# Patient Record
Sex: Male | Born: 1965 | Race: Black or African American | Hispanic: No | Marital: Single | Smoking: Never smoker
Health system: Southern US, Community
[De-identification: ages and names within clinical notes are randomized; demographics above are authoritative.]

## PROBLEM LIST (undated history)

## (undated) DIAGNOSIS — H547 Unspecified visual loss: Secondary | ICD-10-CM

## (undated) DIAGNOSIS — I639 Cerebral infarction, unspecified: Secondary | ICD-10-CM

## (undated) DIAGNOSIS — I10 Essential (primary) hypertension: Secondary | ICD-10-CM

## (undated) DIAGNOSIS — Z933 Colostomy status: Secondary | ICD-10-CM

## (undated) DIAGNOSIS — E785 Hyperlipidemia, unspecified: Secondary | ICD-10-CM

## (undated) DIAGNOSIS — D649 Anemia, unspecified: Secondary | ICD-10-CM

## (undated) DIAGNOSIS — C801 Malignant (primary) neoplasm, unspecified: Secondary | ICD-10-CM

## (undated) DIAGNOSIS — W3400XA Accidental discharge from unspecified firearms or gun, initial encounter: Secondary | ICD-10-CM

## (undated) HISTORY — DX: Hyperlipidemia, unspecified: E78.5

## (undated) HISTORY — DX: Essential (primary) hypertension: I10

## (undated) HISTORY — DX: Accidental discharge from unspecified firearms or gun, initial encounter: W34.00XA

## (undated) HISTORY — DX: Unspecified visual loss: H54.7

## (undated) HISTORY — PX: NECK SURGERY: SHX720

---

## 1995-02-08 DIAGNOSIS — W3400XA Accidental discharge from unspecified firearms or gun, initial encounter: Secondary | ICD-10-CM

## 1995-02-08 HISTORY — DX: Accidental discharge from unspecified firearms or gun, initial encounter: W34.00XA

## 1995-02-08 HISTORY — PX: LUNG REMOVAL, PARTIAL: SHX233

## 2013-02-16 ENCOUNTER — Observation Stay: Payer: Self-pay | Admitting: Surgery

## 2013-02-16 LAB — CBC
HCT: 47.2 % (ref 40.0–52.0)
HGB: 16.2 g/dL (ref 13.0–18.0)
MCH: 29.6 pg (ref 26.0–34.0)
MCHC: 34.4 g/dL (ref 32.0–36.0)
MCV: 86 fL (ref 80–100)
Platelet: 216 10*3/uL (ref 150–440)
RBC: 5.48 10*6/uL (ref 4.40–5.90)
RDW: 13.7 % (ref 11.5–14.5)
WBC: 9.8 10*3/uL (ref 3.8–10.6)

## 2013-02-16 LAB — URINALYSIS, COMPLETE
Bacteria: NONE SEEN
Bilirubin,UR: NEGATIVE
Glucose,UR: 50 mg/dL (ref 0–75)
Leukocyte Esterase: NEGATIVE
Nitrite: NEGATIVE
Ph: 5 (ref 4.5–8.0)
Protein: NEGATIVE
RBC,UR: 22 /HPF (ref 0–5)
SPECIFIC GRAVITY: 1.019 (ref 1.003–1.030)
SQUAMOUS EPITHELIAL: NONE SEEN
WBC UR: 2 /HPF (ref 0–5)

## 2013-02-16 LAB — COMPREHENSIVE METABOLIC PANEL
ALT: 29 U/L (ref 12–78)
ANION GAP: 6 — AB (ref 7–16)
Albumin: 4 g/dL (ref 3.4–5.0)
Alkaline Phosphatase: 67 U/L
BUN: 16 mg/dL (ref 7–18)
Bilirubin,Total: 0.3 mg/dL (ref 0.2–1.0)
CREATININE: 1.29 mg/dL (ref 0.60–1.30)
Calcium, Total: 9 mg/dL (ref 8.5–10.1)
Chloride: 104 mmol/L (ref 98–107)
Co2: 25 mmol/L (ref 21–32)
EGFR (Non-African Amer.): >60
Glucose: 147 mg/dL — ABNORMAL HIGH (ref 65–99)
Osmolality: 274 (ref 275–301)
POTASSIUM: 3.1 mmol/L — AB (ref 3.5–5.1)
SGOT(AST): 33 U/L (ref 15–37)
Sodium: 135 mmol/L — ABNORMAL LOW (ref 136–145)
Total Protein: 8.3 g/dL — ABNORMAL HIGH (ref 6.4–8.2)

## 2013-02-16 LAB — LIPASE, BLOOD: LIPASE: 56 U/L — AB (ref 73–393)

## 2013-02-17 LAB — CBC WITH DIFFERENTIAL/PLATELET
Basophil #: 0 10*3/uL (ref 0.0–0.1)
Basophil %: 0.3 %
Eosinophil #: 0 10*3/uL (ref 0.0–0.7)
Eosinophil %: 0.2 %
HCT: 43.5 % (ref 40.0–52.0)
HGB: 14.8 g/dL (ref 13.0–18.0)
Lymphocyte #: 1.2 10*3/uL (ref 1.0–3.6)
Lymphocyte %: 10.4 %
MCH: 29.4 pg (ref 26.0–34.0)
MCHC: 34.1 g/dL (ref 32.0–36.0)
MCV: 86 fL (ref 80–100)
MONOS PCT: 10.5 %
Monocyte #: 1.2 x10 3/mm — ABNORMAL HIGH (ref 0.2–1.0)
Neutrophil #: 8.9 10*3/uL — ABNORMAL HIGH (ref 1.4–6.5)
Neutrophil %: 78.6 %
Platelet: 181 10*3/uL (ref 150–440)
RBC: 5.05 10*6/uL (ref 4.40–5.90)
RDW: 14 % (ref 11.5–14.5)
WBC: 11.3 10*3/uL — AB (ref 3.8–10.6)

## 2013-02-17 LAB — URINE CULTURE
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
## 2015-06-03 ENCOUNTER — Other Ambulatory Visit: Payer: Self-pay | Admitting: Gastroenterology

## 2015-06-03 DIAGNOSIS — K6289 Other specified diseases of anus and rectum: Secondary | ICD-10-CM

## 2015-06-03 HISTORY — PX: UPPER GI ENDOSCOPY: SHX6162

## 2015-06-03 HISTORY — PX: COLONOSCOPY: SHX174

## 2015-06-04 ENCOUNTER — Encounter: Payer: Self-pay | Admitting: *Deleted

## 2015-06-10 ENCOUNTER — Ambulatory Visit: Admission: RE | Admit: 2015-06-10 | Payer: Medicaid Other | Source: Ambulatory Visit

## 2015-06-17 ENCOUNTER — Ambulatory Visit
Admission: RE | Admit: 2015-06-17 | Discharge: 2015-06-17 | Disposition: A | Discharge status: Home / Self Care | Payer: Medicaid Other | Source: Ambulatory Visit | Attending: Gastroenterology | Admitting: Gastroenterology

## 2015-06-17 ENCOUNTER — Encounter: Payer: Self-pay | Admitting: General Surgery

## 2015-06-17 ENCOUNTER — Ambulatory Visit (INDEPENDENT_AMBULATORY_CARE_PROVIDER_SITE_OTHER): Payer: Medicaid Other | Admitting: General Surgery

## 2015-06-17 VITALS — BP 142/76 | HR 78 | Resp 14 | Ht >= 80 in | Wt 237.0 lb

## 2015-06-17 DIAGNOSIS — C20 Malignant neoplasm of rectum: Secondary | ICD-10-CM | POA: Diagnosis not present

## 2015-06-17 DIAGNOSIS — Z9889 Other specified postprocedural states: Secondary | ICD-10-CM | POA: Diagnosis not present

## 2015-06-17 DIAGNOSIS — Z902 Acquired absence of lung [part of]: Secondary | ICD-10-CM | POA: Insufficient documentation

## 2015-06-17 DIAGNOSIS — I700 Atherosclerosis of aorta: Secondary | ICD-10-CM | POA: Insufficient documentation

## 2015-06-17 DIAGNOSIS — K6289 Other specified diseases of anus and rectum: Secondary | ICD-10-CM

## 2015-06-17 DIAGNOSIS — R938 Abnormal findings on diagnostic imaging of other specified body structures: Secondary | ICD-10-CM | POA: Insufficient documentation

## 2015-06-17 DIAGNOSIS — K629 Disease of anus and rectum, unspecified: Secondary | ICD-10-CM | POA: Insufficient documentation

## 2015-06-17 HISTORY — DX: Malignant (primary) neoplasm, unspecified: C80.1

## 2015-06-17 MED ORDER — IOPAMIDOL (ISOVUE-300) INJECTION 61%
125.0000 mL | Freq: Once | INTRAVENOUS | Status: AC | PRN
  Administered 2015-06-17: 125 mL via INTRAVENOUS

## 2015-06-17 NOTE — Patient Instructions (Signed)
This patient was sent to [facility] today to have the following drawn: CEA and Met C.   An appointment will be arranged for the patient to meet with the medical oncologist. Patient will be contacted with a date and time.   Patient's port placement has been scheduled for 06-26-15 at [facility].
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
## 2015-06-18 ENCOUNTER — Telehealth: Payer: Self-pay | Admitting: *Deleted

## 2015-06-18 DIAGNOSIS — C20 Malignant neoplasm of rectum: Secondary | ICD-10-CM | POA: Insufficient documentation

## 2015-06-18 LAB — COMPREHENSIVE METABOLIC PANEL
ALT: 18 IU/L (ref 0–44)
AST: 14 IU/L (ref 0–40)
Albumin/Globulin Ratio: 1.4 (ref 1.2–2.2)
Albumin: 4.2 g/dL (ref 3.5–5.5)
Alkaline Phosphatase: 71 IU/L (ref 39–117)
BUN/Creatinine Ratio: 9 (ref 9–20)
BUN: 10 mg/dL (ref 6–24)
Bilirubin Total: <0.2 mg/dL (ref 0.0–1.2)
CALCIUM: 9.5 mg/dL (ref 8.7–10.2)
CO2: 22 mmol/L (ref 18–29)
CREATININE: 1.11 mg/dL (ref 0.76–1.27)
Chloride: 98 mmol/L (ref 96–106)
GFR, EST AFRICAN AMERICAN: 90 mL/min/{1.73_m2} (ref >59)
GFR, EST NON AFRICAN AMERICAN: 78 mL/min/{1.73_m2} (ref >59)
GLUCOSE: 116 mg/dL — AB (ref 65–99)
Globulin, Total: 3.1 g/dL (ref 1.5–4.5)
POTASSIUM: 4 mmol/L (ref 3.5–5.2)
Sodium: 139 mmol/L (ref 134–144)
TOTAL PROTEIN: 7.3 g/dL (ref 6.0–8.5)

## 2015-06-18 LAB — CEA: CEA: 0.9 ng/mL (ref 0.0–4.7)
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
## 2015-06-18 NOTE — Telephone Encounter (Signed)
Patient has been scheduled for an appointment with Dr. Oliva Bustard at the [facility] for 06-25-15 at 11 am.   This patient is aware of date, time, and instructions.

## 2015-06-19 ENCOUNTER — Encounter: Payer: Self-pay | Admitting: *Deleted

## 2015-06-19 ENCOUNTER — Other Ambulatory Visit: Payer: Medicaid Other

## 2015-06-19 NOTE — Patient Instructions (Signed)
  Your procedure is scheduled on: 06-26-15 (FRIDAY) Report to [place] To find out your arrival time please call [phone] between 1PM - 3PM on 06-25-15 (THURSDAY)  Remember: Instructions that are not followed completely may result in serious medical risk, up to and including death, or upon the discretion of your surgeon and anesthesiologist your surgery may need to be rescheduled.    _X___ 1. Do not eat food or drink liquids after midnight. No gum chewing or hard candies.     _X___ 2. No Alcohol for 24 hours before or after surgery.   ____ 3. Bring all medications with you on the day of surgery if instructed.    _X___ 4. Notify your doctor if there is any change in your medical condition     (cold, fever, infections).     Do not wear jewelry, make-up, hairpins, clips or nail polish.  Do not wear lotions, powders, or perfumes. You may wear deodorant.  Do not shave 48 hours prior to surgery. Men may shave face and neck.  Do not bring valuables to the hospital.    [facility] is not responsible for any belongings or valuables.               Contacts, dentures or bridgework may not be worn into surgery.  Leave your suitcase in the car. After surgery it may be brought to your room.  For patients admitted to the hospital, discharge time is determined by your treatment team.   Patients discharged the day of surgery will not be allowed to drive home.   Please read over the following fact sheets that you were given:      _X___ Take these medicines the morning of surgery with A SIP OF WATER:    1. AMLODIPINE (NORVASC)  2.   3.   4.  5.  6.  ____ Fleet Enema (as directed)   _X___ Use CHG Soap as directed  ____ Use inhalers on the day of surgery  ____ Stop metformin 2 days prior to surgery    ____ Take 1/2 of usual insulin dose the night before surgery and none on the morning of surgery.   ____ Stop Coumadin/Plavix/aspirin  ____ Stop  Anti-inflammatories   ____ Stop supplements until after surgery.    ____ Bring C-Pap to the hospital.

## 2015-06-22 ENCOUNTER — Telehealth: Payer: Self-pay

## 2015-06-22 NOTE — Telephone Encounter (Signed)
  Oncology Nurse Navigator Documentation  Navigator Location: [facility] (06/22/15 1000) Navigator Encounter Type: Introductory phone call;Telephone (06/22/15 1000) Telephone: Outgoing Call (06/22/15 1000)             Barriers/Navigation Needs: Coordination of Care (06/22/15 1000)   Interventions: Coordination of Care (06/22/15 1000)   Coordination of Care: EUS (06/22/15 1000)        Acuity: Level 2 (06/22/15 1000)   Acuity Level 2: Initial guidance, education and coordination as needed;Educational needs;Assistance expediting appointments;Ongoing guidance and education throughout treatment as needed (06/22/15 1000)     Time Spent with Patient: 30 (06/22/15 1000)   Was able to reach [name] [name] on the telephone. Educated him regarding need for EUS, preferably this week. Educated on EUS and prep. Will need to refer this out to [facility] due to inability at [facility]. Patient information has been sent for referral.

## 2015-06-24 ENCOUNTER — Encounter
Admission: RE | Admit: 2015-06-24 | Discharge: 2015-06-24 | Disposition: A | Discharge status: Home / Self Care | Payer: Medicaid Other | Source: Ambulatory Visit | Attending: General Surgery | Admitting: General Surgery

## 2015-06-24 ENCOUNTER — Other Ambulatory Visit: Payer: Self-pay

## 2015-06-24 DIAGNOSIS — I10 Essential (primary) hypertension: Secondary | ICD-10-CM | POA: Insufficient documentation

## 2015-06-24 DIAGNOSIS — Z01810 Encounter for preprocedural cardiovascular examination: Secondary | ICD-10-CM | POA: Insufficient documentation

## 2015-06-25 ENCOUNTER — Encounter: Payer: Self-pay | Admitting: Oncology

## 2015-06-25 ENCOUNTER — Inpatient Hospital Stay: Payer: Medicaid Other | Admitting: Oncology

## 2015-06-25 ENCOUNTER — Inpatient Hospital Stay: Payer: Medicaid Other | Attending: Oncology | Admitting: Oncology

## 2015-06-25 ENCOUNTER — Inpatient Hospital Stay: Payer: Medicaid Other

## 2015-06-25 VITALS — BP 122/79 | HR 102 | Temp 97.9°F | Resp 18 | Wt 236.2 lb

## 2015-06-25 DIAGNOSIS — Z79899 Other long term (current) drug therapy: Secondary | ICD-10-CM | POA: Insufficient documentation

## 2015-06-25 DIAGNOSIS — E785 Hyperlipidemia, unspecified: Secondary | ICD-10-CM | POA: Insufficient documentation

## 2015-06-25 DIAGNOSIS — Z800 Family history of malignant neoplasm of digestive organs: Secondary | ICD-10-CM | POA: Insufficient documentation

## 2015-06-25 DIAGNOSIS — C20 Malignant neoplasm of rectum: Secondary | ICD-10-CM | POA: Insufficient documentation

## 2015-06-25 DIAGNOSIS — Z862 Personal history of diseases of the blood and blood-forming organs and certain disorders involving the immune mechanism: Secondary | ICD-10-CM | POA: Diagnosis not present

## 2015-06-25 DIAGNOSIS — I10 Essential (primary) hypertension: Secondary | ICD-10-CM | POA: Diagnosis not present

## 2015-06-25 DIAGNOSIS — R918 Other nonspecific abnormal finding of lung field: Secondary | ICD-10-CM | POA: Diagnosis not present

## 2015-06-25 DIAGNOSIS — K769 Liver disease, unspecified: Secondary | ICD-10-CM | POA: Insufficient documentation

## 2015-06-25 DIAGNOSIS — H540 Blindness, both eyes: Secondary | ICD-10-CM | POA: Insufficient documentation

## 2015-06-25 LAB — CBC WITH DIFFERENTIAL/PLATELET
BASOS ABS: 0.1 10*3/uL (ref 0–0.1)
Eosinophils Absolute: 0.4 10*3/uL (ref 0–0.7)
Eosinophils Relative: 4 %
HEMATOCRIT: 38 % — AB (ref 40.0–52.0)
Hemoglobin: 12.4 g/dL — ABNORMAL LOW (ref 13.0–18.0)
Lymphocytes Relative: 21 %
Lymphs Abs: 1.9 10*3/uL (ref 1.0–3.6)
MCH: 26.7 pg (ref 26.0–34.0)
MCHC: 32.7 g/dL (ref 32.0–36.0)
MCV: 81.7 fL (ref 80.0–100.0)
MONO ABS: 0.8 10*3/uL (ref 0.2–1.0)
Monocytes Relative: 9 %
NEUTROS ABS: 5.8 10*3/uL (ref 1.4–6.5)
Platelets: 370 10*3/uL (ref 150–440)
RBC: 4.65 MIL/uL (ref 4.40–5.90)
RDW: 17.2 % — AB (ref 11.5–14.5)
WBC: 9 10*3/uL (ref 3.8–10.6)
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
## 2015-06-25 NOTE — Progress Notes (Signed)
  Oncology Nurse Navigator Documentation  Navigator Location: [facility] (06/25/15 1600) Navigator Encounter Type: Initial MedOnc (06/25/15 1600)   Abnormal Finding Date: 06/03/15 (06/25/15 1600)       Patient Visit Type: MedOnc;Initial (06/25/15 1600) Treatment Phase: Pre-Tx/Tx Discussion (06/25/15 1600) Barriers/Navigation Needs: Coordination of Care (06/25/15 1600)   Interventions: Coordination of Care (06/25/15 1600)                      Time Spent with Patient: 30 (06/25/15 1600)   Met with [name] [name] and his mother during consult with Dr Oliva Bustard for rectal caner. Post EUS for staging today at [facility]. Provided him with my contat information for any future needs or questions. For port placement 5/19 with Dr Bary Castilla. Radiation Oncology will consult next week. 30f to start following.

## 2015-06-25 NOTE — Progress Notes (Signed)
Patient had rectal [place] today.  Scheduled for colon surgery tomorrow.

## 2015-06-26 ENCOUNTER — Ambulatory Visit: Payer: Medicaid Other

## 2015-06-26 ENCOUNTER — Ambulatory Visit: Payer: Medicaid Other | Admitting: Registered Nurse

## 2015-06-26 ENCOUNTER — Encounter
Admission: RE | Disposition: A | Discharge status: Home / Self Care | Payer: Self-pay | Source: Ambulatory Visit | Attending: General Surgery

## 2015-06-26 ENCOUNTER — Encounter: Payer: Self-pay | Admitting: *Deleted

## 2015-06-26 ENCOUNTER — Ambulatory Visit
Admission: RE | Admit: 2015-06-26 | Discharge: 2015-06-26 | Disposition: A | Discharge status: Home / Self Care | Payer: Medicaid Other | Source: Ambulatory Visit | Attending: General Surgery | Admitting: General Surgery

## 2015-06-26 DIAGNOSIS — I10 Essential (primary) hypertension: Secondary | ICD-10-CM | POA: Insufficient documentation

## 2015-06-26 DIAGNOSIS — E119 Type 2 diabetes mellitus without complications: Secondary | ICD-10-CM | POA: Insufficient documentation

## 2015-06-26 DIAGNOSIS — C20 Malignant neoplasm of rectum: Secondary | ICD-10-CM | POA: Insufficient documentation

## 2015-06-26 DIAGNOSIS — Z95828 Presence of other vascular implants and grafts: Secondary | ICD-10-CM

## 2015-06-26 HISTORY — DX: Anemia, unspecified: D64.9

## 2015-06-26 HISTORY — PX: PORTACATH PLACEMENT: SHX2246

## 2015-06-26 SURGERY — INSERTION, TUNNELED CENTRAL VENOUS DEVICE, WITH PORT
Anesthesia: Monitor Anesthesia Care | Wound class: Clean

## 2015-06-26 MED ORDER — LIDOCAINE HCL (PF) 1 % IJ SOLN
INTRAMUSCULAR | Status: AC
  Filled 2015-06-26: qty 30

## 2015-06-26 MED ORDER — PROPOFOL 500 MG/50ML IV EMUL
INTRAVENOUS | Status: DC | PRN
  Administered 2015-06-26: 150 ug/kg/min via INTRAVENOUS

## 2015-06-26 MED ORDER — TRAMADOL HCL 50 MG PO TABS
ORAL_TABLET | ORAL | Status: AC
  Filled 2015-06-26: qty 1

## 2015-06-26 MED ORDER — GLYCOPYRROLATE 0.2 MG/ML IJ SOLN
INTRAMUSCULAR | Status: DC | PRN
  Administered 2015-06-26: 0.2 mg via INTRAVENOUS

## 2015-06-26 MED ORDER — PROPOFOL 10 MG/ML IV BOLUS
INTRAVENOUS | Status: DC | PRN
  Administered 2015-06-26: 20 mg via INTRAVENOUS

## 2015-06-26 MED ORDER — SODIUM CHLORIDE 0.9 % IJ SOLN
INTRAMUSCULAR | Status: AC
  Filled 2015-06-26: qty 50

## 2015-06-26 MED ORDER — FENTANYL CITRATE (PF) 100 MCG/2ML IJ SOLN
INTRAMUSCULAR | Status: DC | PRN
  Administered 2015-06-26: 50 ug via INTRAVENOUS

## 2015-06-26 MED ORDER — FENTANYL CITRATE (PF) 100 MCG/2ML IJ SOLN
INTRAMUSCULAR | Status: AC
  Administered 2015-06-26: 25 ug via INTRAVENOUS
  Filled 2015-06-26: qty 2

## 2015-06-26 MED ORDER — FENTANYL CITRATE (PF) 100 MCG/2ML IJ SOLN
25.0000 ug | INTRAMUSCULAR | Status: DC | PRN
  Administered 2015-06-26 (×3): 25 ug via INTRAVENOUS

## 2015-06-26 MED ORDER — TRAMADOL HCL 50 MG PO TABS
50.0000 mg | ORAL_TABLET | Freq: Four times a day (QID) | ORAL | Status: AC | PRN
  Administered 2015-06-26: 50 mg via ORAL

## 2015-06-26 MED ORDER — TRAMADOL HCL 50 MG PO TABS: 100.0000 mg | ORAL_TABLET | Freq: Four times a day (QID) | ORAL | Status: DC | PRN

## 2015-06-26 MED ORDER — ONDANSETRON HCL 4 MG/2ML IJ SOLN: 4.0000 mg | Freq: Once | INTRAMUSCULAR | Status: DC | PRN

## 2015-06-26 MED ORDER — FAMOTIDINE 20 MG PO TABS
20.0000 mg | ORAL_TABLET | Freq: Once | ORAL | Status: AC
  Administered 2015-06-26: 20 mg via ORAL

## 2015-06-26 MED ORDER — LACTATED RINGERS IV SOLN
INTRAVENOUS | Status: DC
  Administered 2015-06-26: 50 mL/h via INTRAVENOUS

## 2015-06-26 MED ORDER — CEFAZOLIN SODIUM-DEXTROSE 2-4 GM/100ML-% IV SOLN
2.0000 g | INTRAVENOUS | Status: AC
  Administered 2015-06-26: 2 g via INTRAVENOUS

## 2015-06-26 MED ORDER — MIDAZOLAM HCL 2 MG/2ML IJ SOLN
INTRAMUSCULAR | Status: DC | PRN
  Administered 2015-06-26: 2 mg via INTRAVENOUS

## 2015-06-26 MED ORDER — HYDROCODONE-ACETAMINOPHEN 5-325 MG PO TABS: 1.0000 | ORAL_TABLET | ORAL | Status: DC | PRN

## 2015-06-26 MED ORDER — CEFAZOLIN SODIUM-DEXTROSE 2-4 GM/100ML-% IV SOLN
INTRAVENOUS | Status: AC
  Administered 2015-06-26: 2 g via INTRAVENOUS
  Filled 2015-06-26: qty 100

## 2015-06-26 MED ORDER — FAMOTIDINE 20 MG PO TABS
ORAL_TABLET | ORAL | Status: AC
  Administered 2015-06-26: 20 mg via ORAL
  Filled 2015-06-26: qty 1

## 2015-06-26 MED ORDER — SODIUM CHLORIDE 0.9 % IJ SOLN
INTRAMUSCULAR | Status: DC | PRN
  Administered 2015-06-26: 15 mL
  Administered 2015-06-26: 10 mL

## 2015-06-26 SURGICAL SUPPLY — 28 items
BLADE SURG 15 STRL SS SAFETY (BLADE) ×3 IMPLANT
CHLORAPREP W/TINT 26ML (MISCELLANEOUS) ×3 IMPLANT
CLOSURE WOUND 1/2 X4 (GAUZE/BANDAGES/DRESSINGS) ×1
COVER LIGHT HANDLE STERIS (MISCELLANEOUS) ×6 IMPLANT
DECANTER SPIKE VIAL GLASS SM (MISCELLANEOUS) ×6 IMPLANT
DRAPE C-ARM XRAY 36X54 (DRAPES) ×3 IMPLANT
DRAPE LAPAROTOMY TRNSV 106X77 (MISCELLANEOUS) ×3 IMPLANT
DRESSING TELFA 4X3 1S ST N-ADH (GAUZE/BANDAGES/DRESSINGS) ×3 IMPLANT
DRSG TEGADERM 2-3/8X2-3/4 SM (GAUZE/BANDAGES/DRESSINGS) ×3 IMPLANT
DRSG TEGADERM 4X4.75 (GAUZE/BANDAGES/DRESSINGS) ×3 IMPLANT
ELECT REM PT RETURN 9FT ADLT (ELECTROSURGICAL) ×3
ELECTRODE REM PT RTRN 9FT ADLT (ELECTROSURGICAL) ×1 IMPLANT
GLOVE BIO SURGEON STRL SZ7.5 (GLOVE) ×9 IMPLANT
GLOVE INDICATOR 8.0 STRL GRN (GLOVE) ×6 IMPLANT
GOWN STRL REUS W/ TWL LRG LVL3 (GOWN DISPOSABLE) ×2 IMPLANT
GOWN STRL REUS W/TWL LRG LVL3 (GOWN DISPOSABLE) ×4
KIT PORT POWER 8FR ISP CVUE (Catheter) ×3 IMPLANT
KIT RM TURNOVER STRD PROC AR (KITS) ×3 IMPLANT
LABEL OR SOLS (LABEL) ×3 IMPLANT
NS IRRIG 500ML POUR BTL (IV SOLUTION) ×3 IMPLANT
PACK PORT-A-CATH (MISCELLANEOUS) ×3 IMPLANT
STRIP CLOSURE SKIN 1/2X4 (GAUZE/BANDAGES/DRESSINGS) ×2 IMPLANT
SUT PROLENE 3 0 SH DA (SUTURE) ×3 IMPLANT
SUT VIC AB 3-0 SH 27 (SUTURE) ×2
SUT VIC AB 3-0 SH 27X BRD (SUTURE) ×1 IMPLANT
SUT VIC AB 4-0 FS2 27 (SUTURE) ×3 IMPLANT
SWABSTK COMLB BENZOIN TINCTURE (MISCELLANEOUS) ×3 IMPLANT
SYRINGE 10CC LL (SYRINGE) ×3 IMPLANT

## 2015-06-26 NOTE — Progress Notes (Signed)
Dr Bary Castilla in to see patient, rx for vicodin shredded in PACU due to pt allergy, MD gave new RX to pt for Ultram

## 2015-06-26 NOTE — Anesthesia Procedure Notes (Signed)
Date/Time: 06/26/2015 9:04 AM Performed by: Doreen Salvage Pre-anesthesia Checklist: Patient identified, Emergency Drugs available, Suction available and Patient being monitored Patient Re-evaluated:Patient Re-evaluated prior to inductionOxygen Delivery Method: Simple face mask Intubation Type: IV induction Dental Injury: Teeth and Oropharynx as per pre-operative assessment

## 2015-06-26 NOTE — Op Note (Signed)
Preoperative diagnosis: T3 rectal cancer, candidate for neoadjuvant chemoradiation.  Postoperative diagnosis: Same.  Operative procedure: Left subclavian PowerPort placement with ultrasound and fluoroscopic guidance.  Operating surgeon: Ollen Bowl, M.D.  Anesthesia: Attended local, 10 mL 1% plain Xylocaine.  Estimate blood loss: Less than 5 mL.  Clinical note: This 50 year old male was recent and I would rectal cancer needs a candidate for neoadjuvant chemotherapy. Central venous access was requested by the treating oncologist.  Operative note: The patient received Kefzol prior the procedure. Chest was previously prepped with clippers prior to presentation the operating theater. After the induction of sedation the chest neck and axilla was prepped with ChloraPrep and draped. Ultrasound was used to confirm patency of the left subclavian vein. This was cannulated under ultrasound guidance. Guidewire was passed of the SVC followed by the dilator and catheter. It was necessary to place the catheter tip in the right atrium to get good blood return with the patient in the supine position. The cannula was tunneled to a pocket on the left anterior chest were was attached to the port. It was easily irrigated and aspirated in this position. The port was anchored to the deep fascia with interrupted 3-0 Prolene sutures. It was flushed with 10 mL of injectable saline at the end of the procedure. Adipose layer was approximated with a running 3-0 Vicryl suture. Skin closed with a running 4-0 Vicryl septic suture. Benzoin, Steri-Strips, Telfa and Tegaderm dressing applied.  An erect portable chest x-ray obtained in recovery room showed the catheter tip in the right atrium and no evidence of pneumothorax.

## 2015-06-26 NOTE — Discharge Instructions (Signed)

## 2015-06-26 NOTE — Transfer of Care (Signed)
Immediate Anesthesia Transfer of Care Note  Patient: [name] [name]  Procedure(s) Performed: Procedure(s): INSERTION PORT-A-CATH (N/A)  Patient Location: PACU  Anesthesia Type:General  Level of Consciousness: sedated  Airway & Oxygen Therapy: Patient Spontanous Breathing and Patient connected to face mask oxygen  Post-op Assessment: Report given to RN and Post -op Vital signs reviewed and stable  Post vital signs: Reviewed and stable  Last Vitals:  Filed Vitals:   06/26/15 0803 06/26/15 0951  BP: 118/92 109/71  Pulse: 75 65  Temp: 37.3 C 37 C  Resp: 18 16    Complications: No apparent anesthesia complications

## 2015-06-26 NOTE — H&P (Signed)
Candidate for neo-adjuvant chemotherapy. Central venous access requested. 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 Plan: Left power port placement.

## 2015-06-26 NOTE — Anesthesia Preprocedure Evaluation (Signed)
Anesthesia Evaluation  Patient identified by MRN, date of birth, ID band Patient awake    Reviewed: Allergy & Precautions, H&P , NPO status , Patient's Chart, lab work & pertinent test results, reviewed documented beta blocker date and time   Airway Mallampati: II  TM Distance: >3 FB Neck ROM: full    Dental no notable dental hx. (+) Teeth Intact   Pulmonary neg pulmonary ROS,    Pulmonary exam normal breath sounds clear to auscultation       Cardiovascular Exercise Tolerance: Good hypertension, negative cardio ROS   Rhythm:regular Rate:Normal     Neuro/Psych negative neurological ROS  negative psych ROS   GI/Hepatic negative GI ROS, Neg liver ROS,   Endo/Other  negative endocrine ROSdiabetes  Renal/GU      Musculoskeletal   Abdominal   Peds  Hematology negative hematology ROS (+) anemia ,   Anesthesia Other Findings   Reproductive/Obstetrics negative OB ROS                             Anesthesia Physical Anesthesia Plan  ASA: II  Anesthesia Plan: MAC   Post-op Pain Management:    Induction:   Airway Management Planned:   Additional Equipment:   Intra-op Plan:   Post-operative Plan:   Informed Consent: I have reviewed the patients History and Physical, chart, labs and discussed the procedure including the risks, benefits and alternatives for the proposed anesthesia with the patient or authorized representative who has indicated his/her understanding and acceptance.     Plan Discussed with: CRNA  Anesthesia Plan Comments:         Anesthesia Quick Evaluation

## 2015-06-29 NOTE — Patient Instructions (Signed)
Fluorouracil, 5-FU injection What is this medicine? FLUOROURACIL, 5-FU (flure oh YOOR a sil) is a chemotherapy drug. It slows the growth of cancer cells. This medicine is used to treat many types of cancer like breast cancer, colon or rectal cancer, pancreatic cancer, and stomach cancer. This medicine may be used for other purposes; ask your health care provider or pharmacist if you have questions. What should I tell my health care provider before I take this medicine? They need to know if you have any of these conditions: -blood disorders -dihydropyrimidine dehydrogenase (DPD) deficiency -infection (especially a virus infection such as chickenpox, cold sores, or herpes) -kidney disease -liver disease -malnourished, poor nutrition -recent or ongoing radiation therapy -an unusual or allergic reaction to fluorouracil, other chemotherapy, other medicines, foods, dyes, or preservatives -pregnant or trying to get pregnant -breast-feeding How should I use this medicine? This drug is given as an infusion or injection into a vein. It is administered in a hospital or clinic by a specially trained health care professional. Talk to your pediatrician regarding the use of this medicine in children. Special care may be needed. Overdosage: If you think you have taken too much of this medicine contact a poison control center or emergency room at once. NOTE: This medicine is only for you. Do not share this medicine with others. What if I miss a dose? It is important not to miss your dose. Call your doctor or health care professional if you are unable to keep an appointment. What may interact with this medicine? -allopurinol -cimetidine -dapsone -digoxin -hydroxyurea -leucovorin -levamisole -medicines for seizures like ethotoin, fosphenytoin, phenytoin -medicines to increase blood counts like filgrastim, pegfilgrastim, sargramostim -medicines that treat or prevent blood clots like warfarin,  enoxaparin, and dalteparin -methotrexate -metronidazole -pyrimethamine -some other chemotherapy drugs like busulfan, cisplatin, estramustine, vinblastine -trimethoprim -trimetrexate -vaccines Talk to your doctor or health care professional before taking any of these medicines: -acetaminophen -aspirin -ibuprofen -ketoprofen -naproxen This list may not describe all possible interactions. Give your health care provider a list of all the medicines, herbs, non-prescription drugs, or dietary supplements you use. Also tell them if you smoke, drink alcohol, or use illegal drugs. Some items may interact with your medicine. What should I watch for while using this medicine? Visit your doctor for checks on your progress. This drug may make you feel generally unwell. This is not uncommon, as chemotherapy can affect healthy cells as well as cancer cells. Report any side effects. Continue your course of treatment even though you feel ill unless your doctor tells you to stop. In some cases, you may be given additional medicines to help with side effects. Follow all directions for their use. Call your doctor or health care professional for advice if you get a fever, chills or sore throat, or other symptoms of a cold or flu. Do not treat yourself. This drug decreases your body's ability to fight infections. Try to avoid being around people who are sick. This medicine may increase your risk to bruise or bleed. Call your doctor or health care professional if you notice any unusual bleeding. Be careful brushing and flossing your teeth or using a toothpick because you may get an infection or bleed more easily. If you have any dental work done, tell your dentist you are receiving this medicine. Avoid taking products that contain aspirin, acetaminophen, ibuprofen, naproxen, or ketoprofen unless instructed by your doctor. These medicines may hide a fever. Do not become pregnant while taking this medicine. Women should    inform their doctor if they wish to become pregnant or think they might be pregnant. There is a potential for serious side effects to an unborn child. Talk to your health care professional or pharmacist for more information. Do not breast-feed an infant while taking this medicine. Men should inform their doctor if they wish to father a child. This medicine may lower sperm counts. Do not treat diarrhea with over the counter products. Contact your doctor if you have diarrhea that lasts more than 2 days or if it is severe and watery. This medicine can make you more sensitive to the sun. Keep out of the sun. If you cannot avoid being in the sun, wear protective clothing and use sunscreen. Do not use sun lamps or tanning beds/booths. What side effects may I notice from receiving this medicine? Side effects that you should report to your doctor or health care professional as soon as possible: -allergic reactions like skin rash, itching or hives, swelling of the face, lips, or tongue -low blood counts - this medicine may decrease the number of white blood cells, red blood cells and platelets. You may be at increased risk for infections and bleeding. -signs of infection - fever or chills, cough, sore throat, pain or difficulty passing urine -signs of decreased platelets or bleeding - bruising, pinpoint red spots on the skin, black, tarry stools, blood in the urine -signs of decreased red blood cells - unusually weak or tired, fainting spells, lightheadedness -breathing problems -changes in vision -chest pain -mouth sores -nausea and vomiting -pain, swelling, redness at site where injected -pain, tingling, numbness in the hands or feet -redness, swelling, or sores on hands or feet -stomach pain -unusual bleeding Side effects that usually do not require medical attention (report to your doctor or health care professional if they continue or are bothersome): -changes in finger or toe  nails -diarrhea -dry or itchy skin -hair loss -headache -loss of appetite -sensitivity of eyes to the light -stomach upset -unusually teary eyes This list may not describe all possible side effects. Call your doctor for medical advice about side effects. You may report side effects to FDA at 1-800-FDA-1088. Where should I keep my medicine? This drug is given in a hospital or clinic and will not be stored at home. NOTE: This sheet is a summary. It may not cover all possible information. If you have questions about this medicine, talk to your doctor, pharmacist, or health care provider.    2016, [employer]. (2007-05-30 13:53:16)  

## 2015-06-29 NOTE — Anesthesia Postprocedure Evaluation (Signed)
Anesthesia Post Note  Patient: [name] [name]  Procedure(s) Performed: Procedure(s) (LRB): INSERTION PORT-A-CATH (N/A)  Patient location during evaluation: PACU Anesthesia Type: General Level of consciousness: awake and alert Pain management: pain level controlled Vital Signs Assessment: post-procedure vital signs reviewed and stable Respiratory status: spontaneous breathing, nonlabored ventilation, respiratory function stable and patient connected to nasal cannula oxygen Cardiovascular status: blood pressure returned to baseline and stable Postop Assessment: no signs of nausea or vomiting Anesthetic complications: no    Last Vitals:  Filed Vitals:   06/26/15 1044 06/26/15 1124  BP: 147/98 151/90  Pulse: 67 60  Temp: 36.7 C   Resp: 18 18    Last Pain:  Filed Vitals:   06/29/15 0838  PainSc: 0-No pain                 Molli Barrows

## 2015-06-30 ENCOUNTER — Inpatient Hospital Stay: Payer: Medicaid Other

## 2015-07-01 ENCOUNTER — Ambulatory Visit
Admission: RE | Admit: 2015-07-01 | Discharge: 2015-07-01 | Disposition: A | Discharge status: Home / Self Care | Payer: Medicaid Other | Source: Ambulatory Visit | Attending: Radiation Oncology | Admitting: Radiation Oncology

## 2015-07-01 ENCOUNTER — Encounter: Payer: Self-pay | Admitting: Radiation Oncology

## 2015-07-01 VITALS — BP 152/103 | HR 95 | Temp 98.0°F | Ht 78.0 in | Wt 235.7 lb

## 2015-07-01 DIAGNOSIS — Z510 Encounter for antineoplastic radiation therapy: Secondary | ICD-10-CM | POA: Insufficient documentation

## 2015-07-01 DIAGNOSIS — Z87828 Personal history of other (healed) physical injury and trauma: Secondary | ICD-10-CM | POA: Insufficient documentation

## 2015-07-01 DIAGNOSIS — H540 Blindness, both eyes: Secondary | ICD-10-CM | POA: Insufficient documentation

## 2015-07-01 DIAGNOSIS — Z800 Family history of malignant neoplasm of digestive organs: Secondary | ICD-10-CM | POA: Insufficient documentation

## 2015-07-01 DIAGNOSIS — Z79899 Other long term (current) drug therapy: Secondary | ICD-10-CM | POA: Insufficient documentation

## 2015-07-01 DIAGNOSIS — C20 Malignant neoplasm of rectum: Secondary | ICD-10-CM | POA: Insufficient documentation
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
## 2015-07-01 NOTE — Progress Notes (Signed)
Written and verbal information given regarding side effects,  appointments.  Patient and Mother verbalized understanding of all information; questions answered to their satisfaction.  15 minutes spent educating patient and mother.

## 2015-07-03 ENCOUNTER — Other Ambulatory Visit: Payer: Self-pay | Admitting: *Deleted

## 2015-07-03 ENCOUNTER — Telehealth: Payer: Self-pay | Admitting: *Deleted

## 2015-07-03 ENCOUNTER — Ambulatory Visit
Admission: RE | Admit: 2015-07-03 | Discharge: 2015-07-03 | Disposition: A | Discharge status: Home / Self Care | Payer: Medicaid Other | Source: Ambulatory Visit | Attending: Radiation Oncology | Admitting: Radiation Oncology

## 2015-07-03 DIAGNOSIS — Z87828 Personal history of other (healed) physical injury and trauma: Secondary | ICD-10-CM | POA: Diagnosis not present

## 2015-07-03 DIAGNOSIS — Z800 Family history of malignant neoplasm of digestive organs: Secondary | ICD-10-CM | POA: Diagnosis not present

## 2015-07-03 DIAGNOSIS — Z79899 Other long term (current) drug therapy: Secondary | ICD-10-CM | POA: Diagnosis not present

## 2015-07-03 DIAGNOSIS — C20 Malignant neoplasm of rectum: Secondary | ICD-10-CM | POA: Diagnosis not present

## 2015-07-03 DIAGNOSIS — Z510 Encounter for antineoplastic radiation therapy: Secondary | ICD-10-CM | POA: Diagnosis present

## 2015-07-03 DIAGNOSIS — H540 Blindness, both eyes: Secondary | ICD-10-CM | POA: Diagnosis not present

## 2015-07-03 NOTE — Telephone Encounter (Signed)
Pt was questioning why he has to wear a pump to receive chemotherapy. Informed pt that Dr. Oliva Bustard recommends to start 59fu chemotherapy with a 7day pump and that he does not have to worry about operating the pump and that we will manage all the settings for the pump. Informed pt that all he has to do is wear the pump for 7 days then return to the clinic to get it removed to put another pump on. Pt verbalized understanding.   Pt questioned if needed to continue iron tablets. Instructed pt to continue taking at this time then can recheck labs at next appt to see if iron tablet can be discontinued. Pt verbalized understanding.   Informed pt to callback if has any further questions.

## 2015-07-08 DIAGNOSIS — Z510 Encounter for antineoplastic radiation therapy: Secondary | ICD-10-CM | POA: Diagnosis not present

## 2015-07-13 ENCOUNTER — Ambulatory Visit
Admission: RE | Admit: 2015-07-13 | Discharge: 2015-07-13 | Disposition: A | Discharge status: Home / Self Care | Payer: Medicaid Other | Source: Ambulatory Visit | Attending: Radiation Oncology | Admitting: Radiation Oncology

## 2015-07-13 DIAGNOSIS — Z510 Encounter for antineoplastic radiation therapy: Secondary | ICD-10-CM | POA: Diagnosis not present

## 2015-07-14 ENCOUNTER — Ambulatory Visit
Admission: RE | Admit: 2015-07-14 | Discharge: 2015-07-14 | Disposition: A | Discharge status: Home / Self Care | Payer: Medicaid Other | Source: Ambulatory Visit | Attending: Radiation Oncology | Admitting: Radiation Oncology

## 2015-07-14 DIAGNOSIS — Z510 Encounter for antineoplastic radiation therapy: Secondary | ICD-10-CM | POA: Diagnosis not present

## 2015-07-15 ENCOUNTER — Inpatient Hospital Stay: Payer: Medicaid Other | Attending: Oncology

## 2015-07-15 ENCOUNTER — Encounter: Payer: Self-pay | Admitting: Oncology

## 2015-07-15 ENCOUNTER — Telehealth: Payer: Self-pay | Admitting: *Deleted

## 2015-07-15 ENCOUNTER — Ambulatory Visit
Admission: RE | Admit: 2015-07-15 | Discharge: 2015-07-15 | Disposition: A | Discharge status: Home / Self Care | Payer: Medicaid Other | Source: Ambulatory Visit | Attending: Radiation Oncology | Admitting: Radiation Oncology

## 2015-07-15 ENCOUNTER — Inpatient Hospital Stay: Payer: Medicaid Other

## 2015-07-15 ENCOUNTER — Inpatient Hospital Stay (HOSPITAL_BASED_OUTPATIENT_CLINIC_OR_DEPARTMENT_OTHER): Payer: Medicaid Other | Admitting: Oncology

## 2015-07-15 VITALS — BP 164/111 | HR 89 | Temp 96.8°F | Resp 18 | Wt 235.0 lb

## 2015-07-15 DIAGNOSIS — R197 Diarrhea, unspecified: Secondary | ICD-10-CM

## 2015-07-15 DIAGNOSIS — Z79899 Other long term (current) drug therapy: Secondary | ICD-10-CM | POA: Insufficient documentation

## 2015-07-15 DIAGNOSIS — Z510 Encounter for antineoplastic radiation therapy: Secondary | ICD-10-CM | POA: Diagnosis not present

## 2015-07-15 DIAGNOSIS — H540 Blindness, both eyes: Secondary | ICD-10-CM | POA: Diagnosis not present

## 2015-07-15 DIAGNOSIS — E785 Hyperlipidemia, unspecified: Secondary | ICD-10-CM | POA: Diagnosis not present

## 2015-07-15 DIAGNOSIS — G4700 Insomnia, unspecified: Secondary | ICD-10-CM | POA: Insufficient documentation

## 2015-07-15 DIAGNOSIS — E876 Hypokalemia: Secondary | ICD-10-CM | POA: Insufficient documentation

## 2015-07-15 DIAGNOSIS — I2510 Atherosclerotic heart disease of native coronary artery without angina pectoris: Secondary | ICD-10-CM | POA: Diagnosis not present

## 2015-07-15 DIAGNOSIS — C20 Malignant neoplasm of rectum: Secondary | ICD-10-CM

## 2015-07-15 DIAGNOSIS — R112 Nausea with vomiting, unspecified: Secondary | ICD-10-CM | POA: Insufficient documentation

## 2015-07-15 DIAGNOSIS — I10 Essential (primary) hypertension: Secondary | ICD-10-CM

## 2015-07-15 DIAGNOSIS — C801 Malignant (primary) neoplasm, unspecified: Secondary | ICD-10-CM

## 2015-07-15 DIAGNOSIS — Z5111 Encounter for antineoplastic chemotherapy: Secondary | ICD-10-CM | POA: Diagnosis present

## 2015-07-15 LAB — BASIC METABOLIC PANEL
Anion gap: 5 (ref 5–15)
BUN: 12 mg/dL (ref 6–20)
CALCIUM: 9.3 mg/dL (ref 8.9–10.3)
CO2: 26 mmol/L (ref 22–32)
CREATININE: 0.74 mg/dL (ref 0.61–1.24)
Chloride: 105 mmol/L (ref 101–111)
GFR calc non Af Amer: >60 mL/min (ref >60)
Glucose, Bld: 125 mg/dL — ABNORMAL HIGH (ref 65–99)
Potassium: 3.6 mmol/L (ref 3.5–5.1)
SODIUM: 136 mmol/L (ref 135–145)

## 2015-07-15 LAB — CBC WITH DIFFERENTIAL/PLATELET
BASOS PCT: 1 %
Basophils Absolute: 0.1 10*3/uL (ref 0–0.1)
EOS ABS: 0.3 10*3/uL (ref 0–0.7)
EOS PCT: 4 %
HCT: 35.7 % — ABNORMAL LOW (ref 40.0–52.0)
HEMOGLOBIN: 11.8 g/dL — AB (ref 13.0–18.0)
Lymphocytes Relative: 30 %
Lymphs Abs: 2.2 10*3/uL (ref 1.0–3.6)
MCH: 26.4 pg (ref 26.0–34.0)
MCHC: 33 g/dL (ref 32.0–36.0)
MCV: 80 fL (ref 80.0–100.0)
MONO ABS: 0.5 10*3/uL (ref 0.2–1.0)
MONOS PCT: 6 %
NEUTROS PCT: 59 %
Neutro Abs: 4.3 10*3/uL (ref 1.4–6.5)
PLATELETS: 320 10*3/uL (ref 150–440)
RBC: 4.46 MIL/uL (ref 4.40–5.90)
RDW: 16 % — AB (ref 11.5–14.5)
WBC: 7.3 10*3/uL (ref 3.8–10.6)

## 2015-07-15 MED ORDER — LOPERAMIDE HCL 2 MG PO TABS: 2.0000 mg | ORAL_TABLET | Freq: Four times a day (QID) | ORAL | Status: DC | PRN

## 2015-07-15 MED ORDER — SODIUM CHLORIDE 0.9 % IV SOLN
Freq: Once | INTRAVENOUS | Status: DC
  Filled 2015-07-15: qty 1000

## 2015-07-15 MED ORDER — SODIUM CHLORIDE 0.9 % IV SOLN
200.0000 mg/m2/d | INTRAVENOUS | Status: DC
  Administered 2015-07-15: 3400 mg via INTRAVENOUS
  Filled 2015-07-15: qty 68

## 2015-07-15 MED ORDER — SODIUM CHLORIDE 0.9% FLUSH
10.0000 mL | INTRAVENOUS | Status: DC | PRN
  Administered 2015-07-15: 10 mL via INTRAVENOUS
  Filled 2015-07-15: qty 10

## 2015-07-15 MED ORDER — SODIUM CHLORIDE 0.9% FLUSH
10.0000 mL | INTRAVENOUS | Status: DC | PRN
  Filled 2015-07-15: qty 10

## 2015-07-15 MED ORDER — PROMETHAZINE HCL 25 MG PO TABS: 25.0000 mg | ORAL_TABLET | Freq: Four times a day (QID) | ORAL | Status: DC | PRN

## 2015-07-15 MED ORDER — HEPARIN SOD (PORK) LOCK FLUSH 100 UNIT/ML IV SOLN
500.0000 [IU] | Freq: Once | INTRAVENOUS | Status: DC
Start: 2015-07-15 — End: 2015-07-15
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
## 2015-07-15 NOTE — Progress Notes (Signed)
Patient states he has been nauseated and vomiting almost daily.  Requesting antiemetic. Patient states he has also had diarrhea and has taken imodium but needs something stronger as it does not work well for him.   Also BP elevated.  164/111.  States he has not taken BP medication this morning.  Patient also having problems sleeping.  Says the takes Tylenol PM but it makes him feel hung over the next day.

## 2015-07-15 NOTE — Telephone Encounter (Signed)
Imodium and Phenergan e scribed per VO Dr Oliva Bustard pt informed

## 2015-07-16 ENCOUNTER — Ambulatory Visit
Admission: RE | Admit: 2015-07-16 | Discharge: 2015-07-16 | Disposition: A | Discharge status: Home / Self Care | Payer: Medicaid Other | Source: Ambulatory Visit | Attending: Radiation Oncology | Admitting: Radiation Oncology

## 2015-07-16 DIAGNOSIS — Z510 Encounter for antineoplastic radiation therapy: Secondary | ICD-10-CM | POA: Diagnosis not present

## 2015-07-17 ENCOUNTER — Ambulatory Visit
Admission: RE | Admit: 2015-07-17 | Discharge: 2015-07-17 | Disposition: A | Discharge status: Home / Self Care | Payer: Medicaid Other | Source: Ambulatory Visit | Attending: Radiation Oncology | Admitting: Radiation Oncology

## 2015-07-17 DIAGNOSIS — Z510 Encounter for antineoplastic radiation therapy: Secondary | ICD-10-CM | POA: Diagnosis not present

## 2015-07-20 ENCOUNTER — Ambulatory Visit
Admission: RE | Admit: 2015-07-20 | Discharge: 2015-07-20 | Disposition: A | Discharge status: Home / Self Care | Payer: Medicaid Other | Source: Ambulatory Visit | Attending: Radiation Oncology | Admitting: Radiation Oncology

## 2015-07-20 ENCOUNTER — Other Ambulatory Visit: Payer: Self-pay | Admitting: *Deleted

## 2015-07-20 DIAGNOSIS — C20 Malignant neoplasm of rectum: Secondary | ICD-10-CM

## 2015-07-20 DIAGNOSIS — Z510 Encounter for antineoplastic radiation therapy: Secondary | ICD-10-CM | POA: Diagnosis not present

## 2015-07-21 ENCOUNTER — Ambulatory Visit
Admission: RE | Admit: 2015-07-21 | Discharge: 2015-07-21 | Disposition: A | Discharge status: Home / Self Care | Payer: Medicaid Other | Source: Ambulatory Visit | Attending: Radiation Oncology | Admitting: Radiation Oncology

## 2015-07-21 DIAGNOSIS — Z510 Encounter for antineoplastic radiation therapy: Secondary | ICD-10-CM | POA: Diagnosis not present

## 2015-07-22 ENCOUNTER — Inpatient Hospital Stay: Payer: Medicaid Other

## 2015-07-22 ENCOUNTER — Inpatient Hospital Stay (HOSPITAL_BASED_OUTPATIENT_CLINIC_OR_DEPARTMENT_OTHER): Payer: Medicaid Other | Admitting: Internal Medicine

## 2015-07-22 ENCOUNTER — Ambulatory Visit
Admission: RE | Admit: 2015-07-22 | Discharge: 2015-07-22 | Disposition: A | Discharge status: Home / Self Care | Payer: Medicaid Other | Source: Ambulatory Visit | Attending: Radiation Oncology | Admitting: Radiation Oncology

## 2015-07-22 VITALS — BP 142/96 | HR 82 | Temp 96.4°F | Resp 18

## 2015-07-22 VITALS — BP 156/102 | HR 84 | Temp 97.8°F | Resp 18 | Wt 234.6 lb

## 2015-07-22 DIAGNOSIS — G4700 Insomnia, unspecified: Secondary | ICD-10-CM

## 2015-07-22 DIAGNOSIS — Z510 Encounter for antineoplastic radiation therapy: Secondary | ICD-10-CM | POA: Diagnosis not present

## 2015-07-22 DIAGNOSIS — Z5111 Encounter for antineoplastic chemotherapy: Secondary | ICD-10-CM | POA: Diagnosis not present

## 2015-07-22 DIAGNOSIS — R197 Diarrhea, unspecified: Secondary | ICD-10-CM | POA: Diagnosis not present

## 2015-07-22 DIAGNOSIS — C20 Malignant neoplasm of rectum: Secondary | ICD-10-CM

## 2015-07-22 DIAGNOSIS — Z79899 Other long term (current) drug therapy: Secondary | ICD-10-CM

## 2015-07-22 DIAGNOSIS — E876 Hypokalemia: Secondary | ICD-10-CM | POA: Insufficient documentation

## 2015-07-22 DIAGNOSIS — E785 Hyperlipidemia, unspecified: Secondary | ICD-10-CM

## 2015-07-22 DIAGNOSIS — I2510 Atherosclerotic heart disease of native coronary artery without angina pectoris: Secondary | ICD-10-CM

## 2015-07-22 DIAGNOSIS — I10 Essential (primary) hypertension: Secondary | ICD-10-CM | POA: Diagnosis not present

## 2015-07-22 DIAGNOSIS — R112 Nausea with vomiting, unspecified: Secondary | ICD-10-CM

## 2015-07-22 DIAGNOSIS — H540 Blindness, both eyes: Secondary | ICD-10-CM

## 2015-07-22 DIAGNOSIS — C801 Malignant (primary) neoplasm, unspecified: Secondary | ICD-10-CM

## 2015-07-22 LAB — COMPREHENSIVE METABOLIC PANEL
ALK PHOS: 56 U/L (ref 38–126)
ALT: 21 U/L (ref 17–63)
AST: 18 U/L (ref 15–41)
Albumin: 3.9 g/dL (ref 3.5–5.0)
Anion gap: 7 (ref 5–15)
BUN: 12 mg/dL (ref 6–20)
CALCIUM: 8.5 mg/dL — AB (ref 8.9–10.3)
CHLORIDE: 105 mmol/L (ref 101–111)
CO2: 25 mmol/L (ref 22–32)
CREATININE: 0.83 mg/dL (ref 0.61–1.24)
GFR calc Af Amer: >60 mL/min (ref >60)
Glucose, Bld: 113 mg/dL — ABNORMAL HIGH (ref 65–99)
Potassium: 3.3 mmol/L — ABNORMAL LOW (ref 3.5–5.1)
SODIUM: 137 mmol/L (ref 135–145)
Total Bilirubin: 0.4 mg/dL (ref 0.3–1.2)
Total Protein: 7.7 g/dL (ref 6.5–8.1)

## 2015-07-22 LAB — CBC WITH DIFFERENTIAL/PLATELET
Basophils Absolute: 0.1 10*3/uL (ref 0–0.1)
Basophils Relative: 3 %
EOS PCT: 8 %
Eosinophils Absolute: 0.3 10*3/uL (ref 0–0.7)
HEMATOCRIT: 35.4 % — AB (ref 40.0–52.0)
Hemoglobin: 11.6 g/dL — ABNORMAL LOW (ref 13.0–18.0)
LYMPHS PCT: 34 %
Lymphs Abs: 1.5 10*3/uL (ref 1.0–3.6)
MCH: 26.5 pg (ref 26.0–34.0)
MCHC: 32.7 g/dL (ref 32.0–36.0)
MCV: 81 fL (ref 80.0–100.0)
MONO ABS: 0.3 10*3/uL (ref 0.2–1.0)
MONOS PCT: 8 %
NEUTROS ABS: 2.1 10*3/uL (ref 1.4–6.5)
Neutrophils Relative %: 47 %
PLATELETS: 320 10*3/uL (ref 150–440)
RBC: 4.36 MIL/uL — ABNORMAL LOW (ref 4.40–5.90)
RDW: 15.9 % — AB (ref 11.5–14.5)
WBC: 4.3 10*3/uL (ref 3.8–10.6)

## 2015-07-22 MED ORDER — SODIUM CHLORIDE 0.9% FLUSH
10.0000 mL | INTRAVENOUS | Status: DC | PRN
  Administered 2015-07-22 (×2): 10 mL via INTRAVENOUS
  Filled 2015-07-22: qty 10

## 2015-07-22 MED ORDER — SODIUM CHLORIDE 0.9 % IV SOLN
200.0000 mg/m2/d | INTRAVENOUS | Status: DC
  Administered 2015-07-22: 3400 mg via INTRAVENOUS
  Filled 2015-07-22: qty 68

## 2015-07-22 MED ORDER — POTASSIUM CHLORIDE CRYS ER 20 MEQ PO TBCR: 40.0000 meq | EXTENDED_RELEASE_TABLET | Freq: Two times a day (BID) | ORAL | Status: DC

## 2015-07-22 MED ORDER — HEPARIN SOD (PORK) LOCK FLUSH 100 UNIT/ML IV SOLN
500.0000 [IU] | Freq: Once | INTRAVENOUS | Status: AC
  Administered 2015-07-22: 500 [IU] via INTRAVENOUS

## 2015-07-22 MED ORDER — SODIUM CHLORIDE 0.9 % IV SOLN
Freq: Once | INTRAVENOUS | Status: DC
  Filled 2015-07-22: qty 1000

## 2015-07-22 MED ORDER — HEPARIN SOD (PORK) LOCK FLUSH 100 UNIT/ML IV SOLN
INTRAVENOUS | Status: AC
  Filled 2015-07-22: qty 5

## 2015-07-22 MED ORDER — SODIUM CHLORIDE 0.9% FLUSH
10.0000 mL | INTRAVENOUS | Status: DC | PRN
  Filled 2015-07-22: qty 10
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
## 2015-07-22 NOTE — Progress Notes (Signed)
Rn faxed patient's potassium RX to [employer] [facility]	[place]. [employer].

## 2015-07-22 NOTE — Progress Notes (Signed)
Patient states he is having diarrhea.  Uses imodium.  Also states he does not sleep at night.  Says his mind wanders all night.  Asking if he can have something to help him sleep?

## 2015-07-22 NOTE — Assessment & Plan Note (Signed)
T3 N0 stage II rectal cancer currently on neoadjuvant radiation chemotherapy proceed with cycle #2 weekly treatment today. Patient tolerating it well except for mild diarrhea/hypokalemia C discussion below

## 2015-07-22 NOTE — Assessment & Plan Note (Signed)
Recommend potassium supplementation potassium 3.3 today.

## 2015-07-22 NOTE — Assessment & Plan Note (Signed)
Secondary to radiation/5-FU grade 1. Recommend Imodium.

## 2015-07-23 ENCOUNTER — Ambulatory Visit
Admission: RE | Admit: 2015-07-23 | Discharge: 2015-07-23 | Disposition: A | Discharge status: Home / Self Care | Payer: Medicaid Other | Source: Ambulatory Visit | Attending: Radiation Oncology | Admitting: Radiation Oncology

## 2015-07-23 DIAGNOSIS — Z510 Encounter for antineoplastic radiation therapy: Secondary | ICD-10-CM | POA: Diagnosis not present

## 2015-07-24 ENCOUNTER — Ambulatory Visit
Admission: RE | Admit: 2015-07-24 | Discharge: 2015-07-24 | Disposition: A | Discharge status: Home / Self Care | Payer: Medicaid Other | Source: Ambulatory Visit | Attending: Radiation Oncology | Admitting: Radiation Oncology

## 2015-07-24 DIAGNOSIS — Z510 Encounter for antineoplastic radiation therapy: Secondary | ICD-10-CM | POA: Diagnosis not present

## 2015-07-27 ENCOUNTER — Ambulatory Visit
Admission: RE | Admit: 2015-07-27 | Discharge: 2015-07-27 | Disposition: A | Discharge status: Home / Self Care | Payer: Medicaid Other | Source: Ambulatory Visit | Attending: Radiation Oncology | Admitting: Radiation Oncology

## 2015-07-27 DIAGNOSIS — Z510 Encounter for antineoplastic radiation therapy: Secondary | ICD-10-CM | POA: Diagnosis not present

## 2015-07-28 ENCOUNTER — Ambulatory Visit
Admission: RE | Admit: 2015-07-28 | Discharge: 2015-07-28 | Disposition: A | Discharge status: Home / Self Care | Payer: Medicaid Other | Source: Ambulatory Visit | Attending: Radiation Oncology | Admitting: Radiation Oncology

## 2015-07-28 DIAGNOSIS — Z510 Encounter for antineoplastic radiation therapy: Secondary | ICD-10-CM | POA: Diagnosis not present

## 2015-07-29 ENCOUNTER — Inpatient Hospital Stay: Payer: Medicaid Other

## 2015-07-29 ENCOUNTER — Ambulatory Visit
Admission: RE | Admit: 2015-07-29 | Discharge: 2015-07-29 | Disposition: A | Discharge status: Home / Self Care | Payer: Medicaid Other | Source: Ambulatory Visit | Attending: Radiation Oncology | Admitting: Radiation Oncology

## 2015-07-29 VITALS — BP 124/86 | HR 74 | Temp 96.8°F | Resp 18

## 2015-07-29 DIAGNOSIS — C20 Malignant neoplasm of rectum: Secondary | ICD-10-CM

## 2015-07-29 DIAGNOSIS — C801 Malignant (primary) neoplasm, unspecified: Secondary | ICD-10-CM

## 2015-07-29 DIAGNOSIS — Z510 Encounter for antineoplastic radiation therapy: Secondary | ICD-10-CM | POA: Diagnosis not present

## 2015-07-29 DIAGNOSIS — Z5111 Encounter for antineoplastic chemotherapy: Secondary | ICD-10-CM | POA: Diagnosis not present

## 2015-07-29 LAB — CBC WITH DIFFERENTIAL/PLATELET
Basophils Absolute: 0 10*3/uL (ref 0–0.1)
Basophils Relative: 1 %
EOS ABS: 0.3 10*3/uL (ref 0–0.7)
EOS PCT: 6 %
HCT: 35.1 % — ABNORMAL LOW (ref 40.0–52.0)
HEMOGLOBIN: 11.7 g/dL — AB (ref 13.0–18.0)
LYMPHS ABS: 1.2 10*3/uL (ref 1.0–3.6)
Lymphocytes Relative: 26 %
MCH: 27 pg (ref 26.0–34.0)
MCHC: 33.4 g/dL (ref 32.0–36.0)
MCV: 80.9 fL (ref 80.0–100.0)
MONO ABS: 0.4 10*3/uL (ref 0.2–1.0)
MONOS PCT: 10 %
NEUTROS PCT: 57 %
Neutro Abs: 2.6 10*3/uL (ref 1.4–6.5)
Platelets: 240 10*3/uL (ref 150–440)
RBC: 4.35 MIL/uL — ABNORMAL LOW (ref 4.40–5.90)
RDW: 15.9 % — AB (ref 11.5–14.5)
WBC: 4.6 10*3/uL (ref 3.8–10.6)

## 2015-07-29 LAB — BASIC METABOLIC PANEL
Anion gap: 6 (ref 5–15)
BUN: 18 mg/dL (ref 6–20)
CHLORIDE: 105 mmol/L (ref 101–111)
CO2: 27 mmol/L (ref 22–32)
CREATININE: 0.97 mg/dL (ref 0.61–1.24)
Calcium: 9 mg/dL (ref 8.9–10.3)
GFR calc Af Amer: >60 mL/min (ref >60)
GFR calc non Af Amer: >60 mL/min (ref >60)
Glucose, Bld: 115 mg/dL — ABNORMAL HIGH (ref 65–99)
Potassium: 3.6 mmol/L (ref 3.5–5.1)
SODIUM: 138 mmol/L (ref 135–145)

## 2015-07-29 MED ORDER — HEPARIN SOD (PORK) LOCK FLUSH 100 UNIT/ML IV SOLN: 500.0000 [IU] | Freq: Once | INTRAVENOUS | Status: DC

## 2015-07-29 MED ORDER — SODIUM CHLORIDE 0.9% FLUSH
10.0000 mL | INTRAVENOUS | Status: DC | PRN
  Administered 2015-07-29: 10 mL via INTRAVENOUS
  Filled 2015-07-29: qty 10

## 2015-07-29 MED ORDER — SODIUM CHLORIDE 0.9 % IV SOLN
200.0000 mg/m2/d | INTRAVENOUS | Status: DC
  Administered 2015-07-29: 3400 mg via INTRAVENOUS
  Filled 2015-07-29: qty 68

## 2015-07-29 MED ORDER — SODIUM CHLORIDE 0.9 % IV SOLN
Freq: Once | INTRAVENOUS | Status: DC
Start: 2015-07-29 — End: 2015-07-29

## 2015-07-30 ENCOUNTER — Ambulatory Visit
Admission: RE | Admit: 2015-07-30 | Discharge: 2015-07-30 | Disposition: A | Discharge status: Home / Self Care | Payer: Medicaid Other | Source: Ambulatory Visit | Attending: Radiation Oncology | Admitting: Radiation Oncology

## 2015-07-30 DIAGNOSIS — Z510 Encounter for antineoplastic radiation therapy: Secondary | ICD-10-CM | POA: Diagnosis not present

## 2015-07-31 ENCOUNTER — Ambulatory Visit
Admission: RE | Admit: 2015-07-31 | Discharge: 2015-07-31 | Disposition: A | Discharge status: Home / Self Care | Payer: Medicaid Other | Source: Ambulatory Visit | Attending: Radiation Oncology | Admitting: Radiation Oncology

## 2015-07-31 DIAGNOSIS — Z510 Encounter for antineoplastic radiation therapy: Secondary | ICD-10-CM | POA: Diagnosis not present

## 2015-08-03 ENCOUNTER — Ambulatory Visit
Admission: RE | Admit: 2015-08-03 | Discharge: 2015-08-03 | Disposition: A | Discharge status: Home / Self Care | Payer: Medicaid Other | Source: Ambulatory Visit | Attending: Radiation Oncology | Admitting: Radiation Oncology

## 2015-08-03 DIAGNOSIS — Z510 Encounter for antineoplastic radiation therapy: Secondary | ICD-10-CM | POA: Diagnosis not present

## 2015-08-04 ENCOUNTER — Ambulatory Visit
Admission: RE | Admit: 2015-08-04 | Discharge: 2015-08-04 | Disposition: A | Discharge status: Home / Self Care | Payer: Medicaid Other | Source: Ambulatory Visit | Attending: Radiation Oncology | Admitting: Radiation Oncology

## 2015-08-04 DIAGNOSIS — Z510 Encounter for antineoplastic radiation therapy: Secondary | ICD-10-CM | POA: Diagnosis not present

## 2015-08-05 ENCOUNTER — Inpatient Hospital Stay: Payer: Medicaid Other

## 2015-08-05 ENCOUNTER — Ambulatory Visit: Payer: Medicaid Other

## 2015-08-05 ENCOUNTER — Inpatient Hospital Stay: Payer: Medicaid Other | Admitting: Internal Medicine

## 2015-08-05 ENCOUNTER — Inpatient Hospital Stay (HOSPITAL_BASED_OUTPATIENT_CLINIC_OR_DEPARTMENT_OTHER): Payer: Medicaid Other | Admitting: Internal Medicine

## 2015-08-05 ENCOUNTER — Ambulatory Visit
Admission: RE | Admit: 2015-08-05 | Discharge: 2015-08-05 | Disposition: A | Discharge status: Home / Self Care | Payer: Medicaid Other | Source: Ambulatory Visit | Attending: Radiation Oncology | Admitting: Radiation Oncology

## 2015-08-05 VITALS — BP 126/88 | HR 78 | Resp 18

## 2015-08-05 VITALS — BP 166/114 | HR 88 | Temp 97.6°F | Resp 18 | Wt 235.2 lb

## 2015-08-05 DIAGNOSIS — I10 Essential (primary) hypertension: Secondary | ICD-10-CM

## 2015-08-05 DIAGNOSIS — E785 Hyperlipidemia, unspecified: Secondary | ICD-10-CM

## 2015-08-05 DIAGNOSIS — E786 Lipoprotein deficiency: Secondary | ICD-10-CM | POA: Diagnosis not present

## 2015-08-05 DIAGNOSIS — Z510 Encounter for antineoplastic radiation therapy: Secondary | ICD-10-CM | POA: Diagnosis not present

## 2015-08-05 DIAGNOSIS — C20 Malignant neoplasm of rectum: Secondary | ICD-10-CM

## 2015-08-05 DIAGNOSIS — Z5111 Encounter for antineoplastic chemotherapy: Secondary | ICD-10-CM | POA: Diagnosis not present

## 2015-08-05 DIAGNOSIS — R197 Diarrhea, unspecified: Secondary | ICD-10-CM

## 2015-08-05 DIAGNOSIS — C801 Malignant (primary) neoplasm, unspecified: Secondary | ICD-10-CM

## 2015-08-05 DIAGNOSIS — R112 Nausea with vomiting, unspecified: Secondary | ICD-10-CM

## 2015-08-05 DIAGNOSIS — G4700 Insomnia, unspecified: Secondary | ICD-10-CM

## 2015-08-05 DIAGNOSIS — Z79899 Other long term (current) drug therapy: Secondary | ICD-10-CM

## 2015-08-05 DIAGNOSIS — I2510 Atherosclerotic heart disease of native coronary artery without angina pectoris: Secondary | ICD-10-CM

## 2015-08-05 DIAGNOSIS — H540 Blindness, both eyes: Secondary | ICD-10-CM

## 2015-08-05 LAB — CBC WITH DIFFERENTIAL/PLATELET
BASOS ABS: 0 10*3/uL (ref 0–0.1)
BASOS PCT: 1 %
Eosinophils Absolute: 0.3 10*3/uL (ref 0–0.7)
Eosinophils Relative: 6 %
HEMATOCRIT: 35 % — AB (ref 40.0–52.0)
Hemoglobin: 11.7 g/dL — ABNORMAL LOW (ref 13.0–18.0)
Lymphocytes Relative: 19 %
Lymphs Abs: 0.9 10*3/uL — ABNORMAL LOW (ref 1.0–3.6)
MCH: 27.2 pg (ref 26.0–34.0)
MCHC: 33.3 g/dL (ref 32.0–36.0)
MCV: 81.6 fL (ref 80.0–100.0)
MONO ABS: 0.5 10*3/uL (ref 0.2–1.0)
Monocytes Relative: 10 %
NEUTROS ABS: 3.1 10*3/uL (ref 1.4–6.5)
Neutrophils Relative %: 64 %
PLATELETS: 227 10*3/uL (ref 150–440)
RBC: 4.29 MIL/uL — ABNORMAL LOW (ref 4.40–5.90)
RDW: 16.7 % — AB (ref 11.5–14.5)
WBC: 4.8 10*3/uL (ref 3.8–10.6)

## 2015-08-05 LAB — COMPREHENSIVE METABOLIC PANEL
ALBUMIN: 3.9 g/dL (ref 3.5–5.0)
ALT: 28 U/L (ref 17–63)
ANION GAP: 6 (ref 5–15)
AST: 20 U/L (ref 15–41)
Alkaline Phosphatase: 59 U/L (ref 38–126)
BILIRUBIN TOTAL: 0.4 mg/dL (ref 0.3–1.2)
BUN: 14 mg/dL (ref 6–20)
CHLORIDE: 107 mmol/L (ref 101–111)
CO2: 25 mmol/L (ref 22–32)
Calcium: 8.7 mg/dL — ABNORMAL LOW (ref 8.9–10.3)
Creatinine, Ser: 0.95 mg/dL (ref 0.61–1.24)
GFR calc non Af Amer: >60 mL/min (ref >60)
Glucose, Bld: 129 mg/dL — ABNORMAL HIGH (ref 65–99)
POTASSIUM: 3.3 mmol/L — AB (ref 3.5–5.1)
Sodium: 138 mmol/L (ref 135–145)
Total Protein: 7.6 g/dL (ref 6.5–8.1)

## 2015-08-05 MED ORDER — SODIUM CHLORIDE 0.9% FLUSH
10.0000 mL | INTRAVENOUS | Status: DC | PRN
  Administered 2015-08-05: 10 mL
  Filled 2015-08-05: qty 10

## 2015-08-05 MED ORDER — SODIUM CHLORIDE 0.9 % IV SOLN
200.0000 mg/m2/d | INTRAVENOUS | Status: DC
  Administered 2015-08-05: 3400 mg via INTRAVENOUS
  Filled 2015-08-05: qty 68

## 2015-08-05 NOTE — Progress Notes (Signed)
Patient's BP elevated today 166/114.  HR 88.  Patient states he gets nervous when he comes to doctor.

## 2015-08-05 NOTE — Assessment & Plan Note (Addendum)
T3 N0 stage II rectal cancer currently on neoadjuvant radiation chemotherapy proceed with cycle #4  weekly treatment today. Tolerated this fairly well.  # Elevated HTN- non- complicance.  Recommend taking antihypertensives on a regular basis.  # hypokalemia- 3.3.  Recommend potassium.  # chemo in 1 week; labs/ MD/ in 2 weeks.
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
## 2015-08-06 ENCOUNTER — Ambulatory Visit
Admission: RE | Admit: 2015-08-06 | Discharge: 2015-08-06 | Disposition: A | Discharge status: Home / Self Care | Payer: Medicaid Other | Source: Ambulatory Visit | Attending: Radiation Oncology | Admitting: Radiation Oncology

## 2015-08-06 DIAGNOSIS — Z510 Encounter for antineoplastic radiation therapy: Secondary | ICD-10-CM | POA: Diagnosis not present

## 2015-08-07 ENCOUNTER — Ambulatory Visit
Admission: RE | Admit: 2015-08-07 | Discharge: 2015-08-07 | Disposition: A | Discharge status: Home / Self Care | Payer: Medicaid Other | Source: Ambulatory Visit | Attending: Radiation Oncology | Admitting: Radiation Oncology

## 2015-08-07 DIAGNOSIS — Z510 Encounter for antineoplastic radiation therapy: Secondary | ICD-10-CM | POA: Diagnosis not present

## 2015-08-10 ENCOUNTER — Ambulatory Visit
Admission: RE | Admit: 2015-08-10 | Discharge: 2015-08-10 | Disposition: A | Discharge status: Home / Self Care | Payer: Medicaid Other | Source: Ambulatory Visit | Attending: Radiation Oncology | Admitting: Radiation Oncology

## 2015-08-10 DIAGNOSIS — Z510 Encounter for antineoplastic radiation therapy: Secondary | ICD-10-CM | POA: Diagnosis not present

## 2015-08-12 ENCOUNTER — Ambulatory Visit
Admission: RE | Admit: 2015-08-12 | Discharge: 2015-08-12 | Disposition: A | Discharge status: Home / Self Care | Payer: Medicaid Other | Source: Ambulatory Visit | Attending: Radiation Oncology | Admitting: Radiation Oncology

## 2015-08-12 ENCOUNTER — Other Ambulatory Visit: Payer: Self-pay | Admitting: Internal Medicine

## 2015-08-12 ENCOUNTER — Inpatient Hospital Stay: Payer: Medicaid Other

## 2015-08-12 ENCOUNTER — Inpatient Hospital Stay: Payer: Medicaid Other | Attending: Internal Medicine

## 2015-08-12 DIAGNOSIS — E785 Hyperlipidemia, unspecified: Secondary | ICD-10-CM | POA: Diagnosis not present

## 2015-08-12 DIAGNOSIS — H540 Blindness, both eyes: Secondary | ICD-10-CM | POA: Diagnosis not present

## 2015-08-12 DIAGNOSIS — Z923 Personal history of irradiation: Secondary | ICD-10-CM | POA: Insufficient documentation

## 2015-08-12 DIAGNOSIS — Z5111 Encounter for antineoplastic chemotherapy: Secondary | ICD-10-CM | POA: Diagnosis present

## 2015-08-12 DIAGNOSIS — C20 Malignant neoplasm of rectum: Secondary | ICD-10-CM | POA: Diagnosis not present

## 2015-08-12 DIAGNOSIS — I10 Essential (primary) hypertension: Secondary | ICD-10-CM | POA: Insufficient documentation

## 2015-08-12 DIAGNOSIS — C801 Malignant (primary) neoplasm, unspecified: Secondary | ICD-10-CM

## 2015-08-12 DIAGNOSIS — Z800 Family history of malignant neoplasm of digestive organs: Secondary | ICD-10-CM | POA: Diagnosis not present

## 2015-08-12 DIAGNOSIS — Z510 Encounter for antineoplastic radiation therapy: Secondary | ICD-10-CM | POA: Diagnosis not present

## 2015-08-12 DIAGNOSIS — E876 Hypokalemia: Secondary | ICD-10-CM | POA: Insufficient documentation

## 2015-08-12 DIAGNOSIS — Z79899 Other long term (current) drug therapy: Secondary | ICD-10-CM | POA: Insufficient documentation

## 2015-08-12 LAB — CBC WITH DIFFERENTIAL/PLATELET
BASOS PCT: 1 %
Basophils Absolute: 0 10*3/uL (ref 0–0.1)
EOS ABS: 0.3 10*3/uL (ref 0–0.7)
EOS PCT: 7 %
HCT: 35.5 % — ABNORMAL LOW (ref 40.0–52.0)
HEMOGLOBIN: 11.9 g/dL — AB (ref 13.0–18.0)
Lymphocytes Relative: 15 %
Lymphs Abs: 0.7 10*3/uL — ABNORMAL LOW (ref 1.0–3.6)
MCH: 27.4 pg (ref 26.0–34.0)
MCHC: 33.5 g/dL (ref 32.0–36.0)
MCV: 82 fL (ref 80.0–100.0)
MONOS PCT: 12 %
Monocytes Absolute: 0.5 10*3/uL (ref 0.2–1.0)
NEUTROS PCT: 65 %
Neutro Abs: 3.1 10*3/uL (ref 1.4–6.5)
PLATELETS: 233 10*3/uL (ref 150–440)
RBC: 4.33 MIL/uL — AB (ref 4.40–5.90)
RDW: 16.9 % — ABNORMAL HIGH (ref 11.5–14.5)
WBC: 4.7 10*3/uL (ref 3.8–10.6)

## 2015-08-12 LAB — BASIC METABOLIC PANEL
Anion gap: 6 (ref 5–15)
BUN: 19 mg/dL (ref 6–20)
CALCIUM: 8.8 mg/dL — AB (ref 8.9–10.3)
CO2: 25 mmol/L (ref 22–32)
CREATININE: 1.05 mg/dL (ref 0.61–1.24)
Chloride: 106 mmol/L (ref 101–111)
GLUCOSE: 118 mg/dL — AB (ref 65–99)
Potassium: 3.2 mmol/L — ABNORMAL LOW (ref 3.5–5.1)
SODIUM: 137 mmol/L (ref 135–145)

## 2015-08-12 MED ORDER — HEPARIN SOD (PORK) LOCK FLUSH 100 UNIT/ML IV SOLN: 500.0000 [IU] | Freq: Once | INTRAVENOUS | Status: DC

## 2015-08-12 MED ORDER — SODIUM CHLORIDE 0.9% FLUSH
10.0000 mL | INTRAVENOUS | Status: DC | PRN
  Filled 2015-08-12: qty 10

## 2015-08-12 MED ORDER — SODIUM CHLORIDE 0.9 % IV SOLN
200.0000 mg/m2/d | INTRAVENOUS | Status: DC
  Administered 2015-08-12: 3400 mg via INTRAVENOUS
  Filled 2015-08-12: qty 68

## 2015-08-12 MED ORDER — SODIUM CHLORIDE 0.9 % IV SOLN
Freq: Once | INTRAVENOUS | Status: DC
  Filled 2015-08-12: qty 1000

## 2015-08-13 ENCOUNTER — Ambulatory Visit
Admission: RE | Admit: 2015-08-13 | Discharge: 2015-08-13 | Disposition: A | Discharge status: Home / Self Care | Payer: Medicaid Other | Source: Ambulatory Visit | Attending: Radiation Oncology | Admitting: Radiation Oncology

## 2015-08-13 DIAGNOSIS — Z510 Encounter for antineoplastic radiation therapy: Secondary | ICD-10-CM | POA: Diagnosis not present

## 2015-08-14 ENCOUNTER — Ambulatory Visit
Admission: RE | Admit: 2015-08-14 | Discharge: 2015-08-14 | Disposition: A | Discharge status: Home / Self Care | Payer: Medicaid Other | Source: Ambulatory Visit | Attending: Radiation Oncology | Admitting: Radiation Oncology

## 2015-08-14 DIAGNOSIS — Z510 Encounter for antineoplastic radiation therapy: Secondary | ICD-10-CM | POA: Diagnosis not present

## 2015-08-17 ENCOUNTER — Ambulatory Visit
Admission: RE | Admit: 2015-08-17 | Discharge: 2015-08-17 | Disposition: A | Discharge status: Home / Self Care | Payer: Medicaid Other | Source: Ambulatory Visit | Attending: Radiation Oncology | Admitting: Radiation Oncology

## 2015-08-17 DIAGNOSIS — Z510 Encounter for antineoplastic radiation therapy: Secondary | ICD-10-CM | POA: Diagnosis not present

## 2015-08-18 ENCOUNTER — Ambulatory Visit
Admission: RE | Admit: 2015-08-18 | Discharge: 2015-08-18 | Disposition: A | Discharge status: Home / Self Care | Payer: Medicaid Other | Source: Ambulatory Visit | Attending: Radiation Oncology | Admitting: Radiation Oncology

## 2015-08-18 ENCOUNTER — Ambulatory Visit: Payer: Medicaid Other

## 2015-08-18 DIAGNOSIS — Z510 Encounter for antineoplastic radiation therapy: Secondary | ICD-10-CM | POA: Diagnosis not present

## 2015-08-19 ENCOUNTER — Ambulatory Visit
Admission: RE | Admit: 2015-08-19 | Discharge: 2015-08-19 | Disposition: A | Discharge status: Home / Self Care | Payer: Medicaid Other | Source: Ambulatory Visit | Attending: Radiation Oncology | Admitting: Radiation Oncology

## 2015-08-19 ENCOUNTER — Inpatient Hospital Stay: Payer: Medicaid Other

## 2015-08-19 ENCOUNTER — Inpatient Hospital Stay (HOSPITAL_BASED_OUTPATIENT_CLINIC_OR_DEPARTMENT_OTHER): Payer: Medicaid Other | Admitting: Internal Medicine

## 2015-08-19 VITALS — BP 144/101 | HR 96 | Temp 98.2°F | Resp 18 | Wt 236.3 lb

## 2015-08-19 DIAGNOSIS — E785 Hyperlipidemia, unspecified: Secondary | ICD-10-CM

## 2015-08-19 DIAGNOSIS — C20 Malignant neoplasm of rectum: Secondary | ICD-10-CM

## 2015-08-19 DIAGNOSIS — E876 Hypokalemia: Secondary | ICD-10-CM | POA: Diagnosis not present

## 2015-08-19 DIAGNOSIS — Z79899 Other long term (current) drug therapy: Secondary | ICD-10-CM

## 2015-08-19 DIAGNOSIS — Z510 Encounter for antineoplastic radiation therapy: Secondary | ICD-10-CM | POA: Diagnosis not present

## 2015-08-19 DIAGNOSIS — I10 Essential (primary) hypertension: Secondary | ICD-10-CM

## 2015-08-19 DIAGNOSIS — C801 Malignant (primary) neoplasm, unspecified: Secondary | ICD-10-CM

## 2015-08-19 DIAGNOSIS — Z5111 Encounter for antineoplastic chemotherapy: Secondary | ICD-10-CM | POA: Diagnosis not present

## 2015-08-19 DIAGNOSIS — Z800 Family history of malignant neoplasm of digestive organs: Secondary | ICD-10-CM

## 2015-08-19 DIAGNOSIS — H540 Blindness, both eyes: Secondary | ICD-10-CM

## 2015-08-19 LAB — COMPREHENSIVE METABOLIC PANEL
ALBUMIN: 3.9 g/dL (ref 3.5–5.0)
ALK PHOS: 62 U/L (ref 38–126)
ALT: 28 U/L (ref 17–63)
AST: 18 U/L (ref 15–41)
Anion gap: 6 (ref 5–15)
BUN: 19 mg/dL (ref 6–20)
CHLORIDE: 105 mmol/L (ref 101–111)
CO2: 26 mmol/L (ref 22–32)
CREATININE: 1 mg/dL (ref 0.61–1.24)
Calcium: 8.9 mg/dL (ref 8.9–10.3)
Glucose, Bld: 110 mg/dL — ABNORMAL HIGH (ref 65–99)
POTASSIUM: 3.9 mmol/L (ref 3.5–5.1)
SODIUM: 137 mmol/L (ref 135–145)
Total Bilirubin: 0.3 mg/dL (ref 0.3–1.2)
Total Protein: 8 g/dL (ref 6.5–8.1)

## 2015-08-19 LAB — CBC WITH DIFFERENTIAL/PLATELET
BASOS PCT: 1 %
Basophils Absolute: 0 10*3/uL (ref 0–0.1)
EOS ABS: 0.2 10*3/uL (ref 0–0.7)
EOS PCT: 5 %
HCT: 35.2 % — ABNORMAL LOW (ref 40.0–52.0)
HEMOGLOBIN: 11.7 g/dL — AB (ref 13.0–18.0)
LYMPHS ABS: 0.8 10*3/uL — AB (ref 1.0–3.6)
Lymphocytes Relative: 16 %
MCH: 27.5 pg (ref 26.0–34.0)
MCHC: 33.3 g/dL (ref 32.0–36.0)
MCV: 82.5 fL (ref 80.0–100.0)
MONO ABS: 0.6 10*3/uL (ref 0.2–1.0)
MONOS PCT: 12 %
Neutro Abs: 3.2 10*3/uL (ref 1.4–6.5)
Neutrophils Relative %: 66 %
PLATELETS: 309 10*3/uL (ref 150–440)
RBC: 4.27 MIL/uL — ABNORMAL LOW (ref 4.40–5.90)
RDW: 18.9 % — AB (ref 11.5–14.5)
WBC: 4.9 10*3/uL (ref 3.8–10.6)

## 2015-08-19 MED ORDER — HEPARIN SOD (PORK) LOCK FLUSH 100 UNIT/ML IV SOLN
500.0000 [IU] | Freq: Once | INTRAVENOUS | Status: DC | PRN
  Filled 2015-08-19: qty 5

## 2015-08-19 MED ORDER — SODIUM CHLORIDE 0.9% FLUSH
10.0000 mL | INTRAVENOUS | Status: DC | PRN
  Filled 2015-08-19: qty 10

## 2015-08-19 MED ORDER — FLUOROURACIL CHEMO INJECTION 5 GM/100ML
200.0000 mg/m2/d | INTRAVENOUS | Status: DC
  Administered 2015-08-19: 3400 mg via INTRAVENOUS
  Filled 2015-08-19: qty 68
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
## 2015-08-19 NOTE — Assessment & Plan Note (Addendum)
T3 N0 stage II rectal cancer currently on neoadjuvant radiation chemotherapy proceed with cycle # 6 weekly treatment today. Tolerated this fairly well.  # Elevated HTN- however he states his blood pressures are in 120s over 80s at home. Recommend continued antihypertensives on a regular basis.  # hypokalemia- 3.3.improved.   # labs/ MD/ in 2 weeks. I spoke to Dr. Bary Castilla; will likely want the patient to be reevaluated 6-8 weeks post treatment.

## 2015-08-20 ENCOUNTER — Ambulatory Visit
Admission: RE | Admit: 2015-08-20 | Discharge: 2015-08-20 | Disposition: A | Discharge status: Home / Self Care | Payer: Medicaid Other | Source: Ambulatory Visit | Attending: Radiation Oncology | Admitting: Radiation Oncology

## 2015-08-20 DIAGNOSIS — Z510 Encounter for antineoplastic radiation therapy: Secondary | ICD-10-CM | POA: Diagnosis not present

## 2015-08-21 ENCOUNTER — Ambulatory Visit
Admission: RE | Admit: 2015-08-21 | Discharge: 2015-08-21 | Disposition: A | Discharge status: Home / Self Care | Payer: Medicaid Other | Source: Ambulatory Visit | Attending: Radiation Oncology | Admitting: Radiation Oncology

## 2015-08-21 DIAGNOSIS — Z510 Encounter for antineoplastic radiation therapy: Secondary | ICD-10-CM | POA: Diagnosis not present

## 2015-08-26 ENCOUNTER — Inpatient Hospital Stay: Payer: Medicaid Other

## 2015-08-26 DIAGNOSIS — Z5111 Encounter for antineoplastic chemotherapy: Secondary | ICD-10-CM | POA: Diagnosis not present

## 2015-08-26 DIAGNOSIS — C20 Malignant neoplasm of rectum: Secondary | ICD-10-CM

## 2015-08-26 LAB — COMPREHENSIVE METABOLIC PANEL
ALK PHOS: 64 U/L (ref 38–126)
ALT: 23 U/L (ref 17–63)
AST: 17 U/L (ref 15–41)
Albumin: 3.9 g/dL (ref 3.5–5.0)
Anion gap: 8 (ref 5–15)
BUN: 16 mg/dL (ref 6–20)
CALCIUM: 8.8 mg/dL — AB (ref 8.9–10.3)
CHLORIDE: 103 mmol/L (ref 101–111)
CO2: 25 mmol/L (ref 22–32)
CREATININE: 0.97 mg/dL (ref 0.61–1.24)
Glucose, Bld: 108 mg/dL — ABNORMAL HIGH (ref 65–99)
Potassium: 3.6 mmol/L (ref 3.5–5.1)
Sodium: 136 mmol/L (ref 135–145)
Total Bilirubin: 0.4 mg/dL (ref 0.3–1.2)
Total Protein: 8.2 g/dL — ABNORMAL HIGH (ref 6.5–8.1)

## 2015-08-26 LAB — CBC WITH DIFFERENTIAL/PLATELET
BASOS ABS: 0 10*3/uL (ref 0–0.1)
Basophils Relative: 1 %
EOS PCT: 4 %
Eosinophils Absolute: 0.2 10*3/uL (ref 0–0.7)
HEMATOCRIT: 35.7 % — AB (ref 40.0–52.0)
HEMOGLOBIN: 12.3 g/dL — AB (ref 13.0–18.0)
LYMPHS ABS: 0.8 10*3/uL — AB (ref 1.0–3.6)
LYMPHS PCT: 19 %
MCH: 28.2 pg (ref 26.0–34.0)
MCHC: 34.4 g/dL (ref 32.0–36.0)
MCV: 82 fL (ref 80.0–100.0)
Monocytes Absolute: 0.4 10*3/uL (ref 0.2–1.0)
Monocytes Relative: 10 %
NEUTROS ABS: 2.9 10*3/uL (ref 1.4–6.5)
NEUTROS PCT: 66 %
PLATELETS: 313 10*3/uL (ref 150–440)
RBC: 4.36 MIL/uL — AB (ref 4.40–5.90)
RDW: 20.7 % — ABNORMAL HIGH (ref 11.5–14.5)
WBC: 4.4 10*3/uL (ref 3.8–10.6)

## 2015-08-26 MED ORDER — SODIUM CHLORIDE 0.9% FLUSH
10.0000 mL | Freq: Once | INTRAVENOUS | Status: AC
  Administered 2015-08-26: 10 mL via INTRAVENOUS
  Filled 2015-08-26: qty 10

## 2015-08-26 MED ORDER — HEPARIN SOD (PORK) LOCK FLUSH 100 UNIT/ML IV SOLN
500.0000 [IU] | Freq: Once | INTRAVENOUS | Status: AC
  Administered 2015-08-26: 500 [IU] via INTRAVENOUS
  Filled 2015-08-26: qty 5

## 2015-09-02 ENCOUNTER — Inpatient Hospital Stay (HOSPITAL_BASED_OUTPATIENT_CLINIC_OR_DEPARTMENT_OTHER): Payer: Medicaid Other | Admitting: Internal Medicine

## 2015-09-02 ENCOUNTER — Ambulatory Visit: Payer: Medicaid Other

## 2015-09-02 ENCOUNTER — Other Ambulatory Visit: Payer: Medicaid Other

## 2015-09-02 ENCOUNTER — Ambulatory Visit: Payer: Medicaid Other | Admitting: Internal Medicine

## 2015-09-02 ENCOUNTER — Inpatient Hospital Stay: Payer: Medicaid Other

## 2015-09-02 VITALS — BP 143/97 | HR 91 | Temp 97.9°F | Resp 18 | Wt 241.5 lb

## 2015-09-02 DIAGNOSIS — I10 Essential (primary) hypertension: Secondary | ICD-10-CM | POA: Diagnosis not present

## 2015-09-02 DIAGNOSIS — E785 Hyperlipidemia, unspecified: Secondary | ICD-10-CM | POA: Diagnosis not present

## 2015-09-02 DIAGNOSIS — C20 Malignant neoplasm of rectum: Secondary | ICD-10-CM | POA: Diagnosis not present

## 2015-09-02 DIAGNOSIS — Z923 Personal history of irradiation: Secondary | ICD-10-CM

## 2015-09-02 DIAGNOSIS — H540 Blindness, both eyes: Secondary | ICD-10-CM

## 2015-09-02 DIAGNOSIS — E786 Lipoprotein deficiency: Secondary | ICD-10-CM | POA: Diagnosis not present

## 2015-09-02 DIAGNOSIS — Z5111 Encounter for antineoplastic chemotherapy: Secondary | ICD-10-CM | POA: Diagnosis not present

## 2015-09-02 DIAGNOSIS — Z79899 Other long term (current) drug therapy: Secondary | ICD-10-CM

## 2015-09-02 DIAGNOSIS — Z800 Family history of malignant neoplasm of digestive organs: Secondary | ICD-10-CM

## 2015-09-02 LAB — COMPREHENSIVE METABOLIC PANEL
ALBUMIN: 3.8 g/dL (ref 3.5–5.0)
ALK PHOS: 59 U/L (ref 38–126)
ALT: 40 U/L (ref 17–63)
AST: 25 U/L (ref 15–41)
Anion gap: 4 — ABNORMAL LOW (ref 5–15)
BUN: 15 mg/dL (ref 6–20)
CALCIUM: 8.6 mg/dL — AB (ref 8.9–10.3)
CO2: 25 mmol/L (ref 22–32)
CREATININE: 0.86 mg/dL (ref 0.61–1.24)
Chloride: 107 mmol/L (ref 101–111)
GFR calc non Af Amer: >60 mL/min (ref >60)
GLUCOSE: 109 mg/dL — AB (ref 65–99)
Potassium: 3.5 mmol/L (ref 3.5–5.1)
SODIUM: 136 mmol/L (ref 135–145)
Total Bilirubin: 0.5 mg/dL (ref 0.3–1.2)
Total Protein: 7.9 g/dL (ref 6.5–8.1)

## 2015-09-02 LAB — CBC WITH DIFFERENTIAL/PLATELET
Basophils Absolute: 0 10*3/uL (ref 0–0.1)
Basophils Relative: 1 %
EOS ABS: 0.2 10*3/uL (ref 0–0.7)
Eosinophils Relative: 4 %
HCT: 35.3 % — ABNORMAL LOW (ref 40.0–52.0)
HEMOGLOBIN: 11.8 g/dL — AB (ref 13.0–18.0)
LYMPHS ABS: 1.1 10*3/uL (ref 1.0–3.6)
Lymphocytes Relative: 25 %
MCH: 27.6 pg (ref 26.0–34.0)
MCHC: 33.5 g/dL (ref 32.0–36.0)
MCV: 82.4 fL (ref 80.0–100.0)
Monocytes Absolute: 0.6 10*3/uL (ref 0.2–1.0)
Monocytes Relative: 14 %
NEUTROS PCT: 56 %
Neutro Abs: 2.4 10*3/uL (ref 1.4–6.5)
Platelets: 254 10*3/uL (ref 150–440)
RBC: 4.28 MIL/uL — AB (ref 4.40–5.90)
RDW: 21.5 % — ABNORMAL HIGH (ref 11.5–14.5)
WBC: 4.3 10*3/uL (ref 3.8–10.6)

## 2015-09-02 MED ORDER — HEPARIN SOD (PORK) LOCK FLUSH 100 UNIT/ML IV SOLN
500.0000 [IU] | Freq: Once | INTRAVENOUS | Status: AC
  Administered 2015-09-02: 500 [IU] via INTRAVENOUS

## 2015-09-02 MED ORDER — SODIUM CHLORIDE 0.9% FLUSH
10.0000 mL | Freq: Once | INTRAVENOUS | Status: AC
  Administered 2015-09-02: 10 mL via INTRAVENOUS
  Filled 2015-09-02: qty 10

## 2015-09-02 NOTE — Assessment & Plan Note (Addendum)
T3 N0 stage II rectal cancer s/p on neoadjuvant radiation chemotherapy  [finished July 14th]. Recommend follow up with Dr.Byrnett in 4 weeks or so.   # Elevated HTN-  Improved-   # hypokalemia- 3.3.improved.   # labs/ MD/ in 6 weeks. I spoke to Dr. Bary Castilla.
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
## 2015-09-04 ENCOUNTER — Other Ambulatory Visit: Payer: Medicaid Other

## 2015-09-04 ENCOUNTER — Ambulatory Visit: Payer: Medicaid Other | Admitting: Internal Medicine

## 2015-09-04 ENCOUNTER — Ambulatory Visit: Payer: Medicaid Other

## 2015-09-21 ENCOUNTER — Encounter: Payer: Self-pay | Admitting: *Deleted

## 2015-09-22 ENCOUNTER — Telehealth: Payer: Self-pay | Admitting: *Deleted

## 2015-09-22 NOTE — Telephone Encounter (Signed)
Patient notified of appointment change.  Date and time were acceptable to him.

## 2015-09-23 ENCOUNTER — Ambulatory Visit: Payer: Medicaid Other | Admitting: Radiation Oncology

## 2015-09-24 ENCOUNTER — Ambulatory Visit (INDEPENDENT_AMBULATORY_CARE_PROVIDER_SITE_OTHER): Payer: Medicaid Other | Admitting: General Surgery

## 2015-09-24 ENCOUNTER — Encounter: Payer: Self-pay | Admitting: General Surgery

## 2015-09-24 VITALS — BP 130/76 | HR 74 | Resp 12 | Ht 78.0 in | Wt 237.0 lb

## 2015-09-24 DIAGNOSIS — C20 Malignant neoplasm of rectum: Secondary | ICD-10-CM | POA: Diagnosis not present

## 2015-09-24 DIAGNOSIS — H47619 Cortical blindness, unspecified side of brain: Secondary | ICD-10-CM

## 2015-09-24 NOTE — Patient Instructions (Addendum)
The patient is aware to call back for any questions or concerns.  
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
## 2015-09-26 DIAGNOSIS — H47619 Cortical blindness, unspecified side of brain: Secondary | ICD-10-CM | POA: Insufficient documentation

## 2015-09-28 ENCOUNTER — Other Ambulatory Visit: Payer: Self-pay

## 2015-09-28 DIAGNOSIS — C20 Malignant neoplasm of rectum: Secondary | ICD-10-CM

## 2015-09-30 ENCOUNTER — Other Ambulatory Visit: Payer: Medicaid Other

## 2015-10-14 ENCOUNTER — Inpatient Hospital Stay: Payer: Medicaid Other | Admitting: Internal Medicine

## 2015-10-14 ENCOUNTER — Inpatient Hospital Stay: Payer: Medicaid Other

## 2015-10-14 ENCOUNTER — Other Ambulatory Visit: Payer: Medicaid Other

## 2015-10-14 ENCOUNTER — Ambulatory Visit: Payer: Medicaid Other | Attending: Radiation Oncology | Admitting: Radiation Oncology

## 2015-10-14 ENCOUNTER — Ambulatory Visit: Payer: Medicaid Other | Admitting: Internal Medicine

## 2015-10-23 ENCOUNTER — Inpatient Hospital Stay: Payer: Medicaid Other

## 2015-10-23 ENCOUNTER — Inpatient Hospital Stay: Payer: Medicaid Other | Admitting: Internal Medicine

## 2015-11-02 ENCOUNTER — Ambulatory Visit: Payer: Medicaid Other | Admitting: Internal Medicine

## 2015-11-02 ENCOUNTER — Other Ambulatory Visit: Payer: Medicaid Other

## 2015-11-24 ENCOUNTER — Inpatient Hospital Stay: Payer: Medicaid Other

## 2015-11-24 ENCOUNTER — Inpatient Hospital Stay: Payer: Medicaid Other | Attending: Internal Medicine

## 2015-12-04 ENCOUNTER — Telehealth: Payer: Self-pay

## 2015-12-04 NOTE — Telephone Encounter (Signed)
Patient has not shown up for numerous appointments.  T/C to mother and asked if patient still has port-a-cath.  Mother states she "thinks" [facility] removed patients port.  Informed mother the importance of keeping port flushed due to prevent blood clots.  Instructed mother to please call cancer center and schedule port flush if patients does still have a port-a-cath.  Also patient has missed his SCP visits and mother states she would like SCP mailed to her.  SCP mailed to patient.

## 2015-12-08 ENCOUNTER — Telehealth: Payer: Self-pay | Admitting: *Deleted

## 2015-12-08 NOTE — Telephone Encounter (Signed)
Dr. B reviewed chart. Patient was at [facility] for surgery during the no show visits.  Per md, ok to r/s this apt.

## 2015-12-08 NOTE — Telephone Encounter (Signed)
Patient called to ask to be scheduled to restart his treatments. He has no showed for his last 4 appts.  Do you want to reschedule?

## 2015-12-16 ENCOUNTER — Inpatient Hospital Stay: Payer: Medicaid Other | Attending: Internal Medicine | Admitting: Internal Medicine

## 2015-12-16 ENCOUNTER — Inpatient Hospital Stay: Payer: Medicaid Other

## 2015-12-16 VITALS — BP 137/95 | HR 95 | Temp 97.0°F | Resp 18 | Wt 233.4 lb

## 2015-12-16 DIAGNOSIS — Z9221 Personal history of antineoplastic chemotherapy: Secondary | ICD-10-CM | POA: Insufficient documentation

## 2015-12-16 DIAGNOSIS — C20 Malignant neoplasm of rectum: Secondary | ICD-10-CM | POA: Diagnosis not present

## 2015-12-16 DIAGNOSIS — D649 Anemia, unspecified: Secondary | ICD-10-CM | POA: Diagnosis not present

## 2015-12-16 DIAGNOSIS — Z933 Colostomy status: Secondary | ICD-10-CM | POA: Insufficient documentation

## 2015-12-16 DIAGNOSIS — Z87828 Personal history of other (healed) physical injury and trauma: Secondary | ICD-10-CM | POA: Insufficient documentation

## 2015-12-16 DIAGNOSIS — Z79899 Other long term (current) drug therapy: Secondary | ICD-10-CM | POA: Diagnosis not present

## 2015-12-16 DIAGNOSIS — H543 Unqualified visual loss, both eyes: Secondary | ICD-10-CM | POA: Diagnosis not present

## 2015-12-16 DIAGNOSIS — I10 Essential (primary) hypertension: Secondary | ICD-10-CM

## 2015-12-16 DIAGNOSIS — M549 Dorsalgia, unspecified: Secondary | ICD-10-CM | POA: Insufficient documentation

## 2015-12-16 DIAGNOSIS — Z902 Acquired absence of lung [part of]: Secondary | ICD-10-CM | POA: Insufficient documentation

## 2015-12-16 DIAGNOSIS — Z7982 Long term (current) use of aspirin: Secondary | ICD-10-CM | POA: Diagnosis not present

## 2015-12-16 DIAGNOSIS — Z800 Family history of malignant neoplasm of digestive organs: Secondary | ICD-10-CM | POA: Insufficient documentation

## 2015-12-16 DIAGNOSIS — Z5111 Encounter for antineoplastic chemotherapy: Secondary | ICD-10-CM | POA: Insufficient documentation

## 2015-12-16 DIAGNOSIS — E785 Hyperlipidemia, unspecified: Secondary | ICD-10-CM | POA: Diagnosis not present

## 2015-12-16 DIAGNOSIS — Z923 Personal history of irradiation: Secondary | ICD-10-CM | POA: Diagnosis not present

## 2015-12-16 LAB — COMPREHENSIVE METABOLIC PANEL
ALT: 60 U/L (ref 17–63)
ANION GAP: 6 (ref 5–15)
AST: 31 U/L (ref 15–41)
Albumin: 4.1 g/dL (ref 3.5–5.0)
Alkaline Phosphatase: 103 U/L (ref 38–126)
BUN: 12 mg/dL (ref 6–20)
CHLORIDE: 106 mmol/L (ref 101–111)
CO2: 25 mmol/L (ref 22–32)
CREATININE: 1.1 mg/dL (ref 0.61–1.24)
Calcium: 9 mg/dL (ref 8.9–10.3)
GFR calc non Af Amer: >60 mL/min (ref >60)
Glucose, Bld: 110 mg/dL — ABNORMAL HIGH (ref 65–99)
Potassium: 3.5 mmol/L (ref 3.5–5.1)
SODIUM: 137 mmol/L (ref 135–145)
Total Bilirubin: 0.4 mg/dL (ref 0.3–1.2)
Total Protein: 8.3 g/dL — ABNORMAL HIGH (ref 6.5–8.1)

## 2015-12-16 LAB — CBC WITH DIFFERENTIAL/PLATELET
BASOS ABS: 0.1 10*3/uL (ref 0–0.1)
Basophils Relative: 1 %
EOS ABS: 0.1 10*3/uL (ref 0–0.7)
EOS PCT: 2 %
HCT: 38.6 % — ABNORMAL LOW (ref 40.0–52.0)
Hemoglobin: 13 g/dL (ref 13.0–18.0)
LYMPHS ABS: 1.6 10*3/uL (ref 1.0–3.6)
Lymphocytes Relative: 30 %
MCH: 28 pg (ref 26.0–34.0)
MCHC: 33.5 g/dL (ref 32.0–36.0)
MCV: 83.5 fL (ref 80.0–100.0)
Monocytes Absolute: 0.4 10*3/uL (ref 0.2–1.0)
Monocytes Relative: 8 %
Neutro Abs: 3.2 10*3/uL (ref 1.4–6.5)
Neutrophils Relative %: 59 %
PLATELETS: 226 10*3/uL (ref 150–440)
RBC: 4.62 MIL/uL (ref 4.40–5.90)
RDW: 16.8 % — ABNORMAL HIGH (ref 11.5–14.5)
WBC: 5.4 10*3/uL (ref 3.8–10.6)

## 2015-12-16 MED ORDER — ONDANSETRON HCL 8 MG PO TABS: ORAL_TABLET | ORAL | 1 refills | Status: DC

## 2015-12-16 MED ORDER — PROCHLORPERAZINE MALEATE 10 MG PO TABS: 10.0000 mg | ORAL_TABLET | Freq: Four times a day (QID) | ORAL | 1 refills | Status: DC | PRN

## 2015-12-16 NOTE — Assessment & Plan Note (Addendum)
s/p on neoadjuvant radiation chemotherapy- ypT3ypN1-stage III rectal cancer; negative margins. Recommend total of 6 months of preoperative chemotherapy. 5 months of FOLFOX [10 treatments]. Discussed goal of treatment is cure.   # Discussed cold sensitivity with oxaliplatin/neuropathy.  # Proceed with FOLFOX chemotherapy starting next week.  # Elevated HTN-  Improved   # follow up next week for chemo; and again in 3 weeks/ labs/MD. I reviewed the pathology from [facility].

## 2015-12-16 NOTE — Progress Notes (Signed)
START ON PATHWAY REGIMEN - Colorectal  ROS56: [id number] q14 Days x 4 Months   A cycle is every 14 days:     Oxaliplatin (Eloxatin(R)) 85 mg/m2 in 250 mL D5W IV over 2 hours day 1, q14 days Dose Mod: None     Leucovorin 400 mg/m2 in 250 mL D5W IV over 2 hours day 1, q14 days, followed immediately by Dose Mod: None     5-Fluorouracil 400 mg/m2 IV bolus over 2-4 minutes day 1, q14 days Dose Mod: None     5-Fluorouracil 2,400 mg/m2 in _____mL NS CIV as a 46 hour infusion starting on day 1, q14 days Dose Mod: None Additional Orders: **Note: order sheet contains two q14 day cycles**  **Always confirm dose/schedule in your pharmacy ordering system**    Patient Characteristics: Rectal Neoadjuvant/Adjuvant, T3 - T4, N0 or Any T, N+***, Postoperative - Had Neoadjuvant Therapy - Preoperative Node Positive AJCC T Stage: X AJCC N Stage: X AJCC Stage Grouping: IIIA AJCC M Stage: X Current evidence of distant metastases? No  Intent of Therapy: Curative Intent, Discussed with Patient

## 2015-12-16 NOTE — Progress Notes (Signed)
Patient is here for follow up  
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
## 2015-12-23 ENCOUNTER — Inpatient Hospital Stay: Payer: Medicaid Other

## 2015-12-25 ENCOUNTER — Inpatient Hospital Stay: Payer: Medicaid Other

## 2016-01-06 ENCOUNTER — Inpatient Hospital Stay: Payer: Medicaid Other

## 2016-01-06 ENCOUNTER — Inpatient Hospital Stay (HOSPITAL_BASED_OUTPATIENT_CLINIC_OR_DEPARTMENT_OTHER): Payer: Medicaid Other | Admitting: Internal Medicine

## 2016-01-06 ENCOUNTER — Other Ambulatory Visit: Payer: Self-pay | Admitting: Internal Medicine

## 2016-01-06 VITALS — BP 138/93 | HR 108 | Temp 98.1°F | Resp 18 | Wt 232.0 lb

## 2016-01-06 VITALS — BP 133/86 | HR 79 | Temp 98.5°F | Resp 18

## 2016-01-06 DIAGNOSIS — Z79899 Other long term (current) drug therapy: Secondary | ICD-10-CM

## 2016-01-06 DIAGNOSIS — Z923 Personal history of irradiation: Secondary | ICD-10-CM

## 2016-01-06 DIAGNOSIS — Z7982 Long term (current) use of aspirin: Secondary | ICD-10-CM

## 2016-01-06 DIAGNOSIS — Z5111 Encounter for antineoplastic chemotherapy: Secondary | ICD-10-CM | POA: Diagnosis not present

## 2016-01-06 DIAGNOSIS — E785 Hyperlipidemia, unspecified: Secondary | ICD-10-CM

## 2016-01-06 DIAGNOSIS — D649 Anemia, unspecified: Secondary | ICD-10-CM

## 2016-01-06 DIAGNOSIS — Z87828 Personal history of other (healed) physical injury and trauma: Secondary | ICD-10-CM

## 2016-01-06 DIAGNOSIS — M549 Dorsalgia, unspecified: Secondary | ICD-10-CM

## 2016-01-06 DIAGNOSIS — C801 Malignant (primary) neoplasm, unspecified: Secondary | ICD-10-CM

## 2016-01-06 DIAGNOSIS — Z902 Acquired absence of lung [part of]: Secondary | ICD-10-CM

## 2016-01-06 DIAGNOSIS — H543 Unqualified visual loss, both eyes: Secondary | ICD-10-CM

## 2016-01-06 DIAGNOSIS — Z9221 Personal history of antineoplastic chemotherapy: Secondary | ICD-10-CM

## 2016-01-06 DIAGNOSIS — I10 Essential (primary) hypertension: Secondary | ICD-10-CM

## 2016-01-06 DIAGNOSIS — C20 Malignant neoplasm of rectum: Secondary | ICD-10-CM

## 2016-01-06 DIAGNOSIS — Z800 Family history of malignant neoplasm of digestive organs: Secondary | ICD-10-CM

## 2016-01-06 DIAGNOSIS — Z933 Colostomy status: Secondary | ICD-10-CM

## 2016-01-06 LAB — COMPREHENSIVE METABOLIC PANEL
ALBUMIN: 3.8 g/dL (ref 3.5–5.0)
ALK PHOS: 91 U/L (ref 38–126)
ALT: 47 U/L (ref 17–63)
ANION GAP: 7 (ref 5–15)
AST: 30 U/L (ref 15–41)
BILIRUBIN TOTAL: 0.5 mg/dL (ref 0.3–1.2)
BUN: 13 mg/dL (ref 6–20)
CALCIUM: 8.9 mg/dL (ref 8.9–10.3)
CO2: 21 mmol/L — AB (ref 22–32)
Chloride: 109 mmol/L (ref 101–111)
Creatinine, Ser: 1.02 mg/dL (ref 0.61–1.24)
GFR calc Af Amer: >60 mL/min (ref >60)
GFR calc non Af Amer: >60 mL/min (ref >60)
GLUCOSE: 103 mg/dL — AB (ref 65–99)
Potassium: 3.4 mmol/L — ABNORMAL LOW (ref 3.5–5.1)
SODIUM: 137 mmol/L (ref 135–145)
Total Protein: 8.2 g/dL — ABNORMAL HIGH (ref 6.5–8.1)

## 2016-01-06 LAB — CBC WITH DIFFERENTIAL/PLATELET
BASOS ABS: 0.1 10*3/uL (ref 0–0.1)
BASOS PCT: 1 %
EOS ABS: 0.1 10*3/uL (ref 0–0.7)
Eosinophils Relative: 3 %
HEMATOCRIT: 38.4 % — AB (ref 40.0–52.0)
HEMOGLOBIN: 12.8 g/dL — AB (ref 13.0–18.0)
Lymphocytes Relative: 35 %
Lymphs Abs: 1.9 10*3/uL (ref 1.0–3.6)
MCH: 27 pg (ref 26.0–34.0)
MCHC: 33.2 g/dL (ref 32.0–36.0)
MCV: 81.2 fL (ref 80.0–100.0)
Monocytes Absolute: 0.5 10*3/uL (ref 0.2–1.0)
Monocytes Relative: 9 %
NEUTROS ABS: 2.8 10*3/uL (ref 1.4–6.5)
NEUTROS PCT: 52 %
Platelets: 263 10*3/uL (ref 150–440)
RBC: 4.73 MIL/uL (ref 4.40–5.90)
RDW: 16.3 % — ABNORMAL HIGH (ref 11.5–14.5)
WBC: 5.3 10*3/uL (ref 3.8–10.6)

## 2016-01-06 MED ORDER — PALONOSETRON HCL INJECTION 0.25 MG/5ML
0.2500 mg | Freq: Once | INTRAVENOUS | Status: AC
  Administered 2016-01-06: 0.25 mg via INTRAVENOUS
  Filled 2016-01-06: qty 5

## 2016-01-06 MED ORDER — SODIUM CHLORIDE 0.9% FLUSH
10.0000 mL | INTRAVENOUS | Status: DC | PRN
  Administered 2016-01-06: 10 mL
  Filled 2016-01-06: qty 10

## 2016-01-06 MED ORDER — SODIUM CHLORIDE 0.9 % IV SOLN: 10.0000 mg | Freq: Once | INTRAVENOUS | Status: DC

## 2016-01-06 MED ORDER — LEUCOVORIN CALCIUM INJECTION 350 MG
950.0000 mg | Freq: Once | INTRAVENOUS | Status: AC
  Administered 2016-01-06: 950 mg via INTRAVENOUS
  Filled 2016-01-06: qty 47.5

## 2016-01-06 MED ORDER — OXALIPLATIN CHEMO INJECTION 100 MG/20ML
200.0000 mg | Freq: Once | INTRAVENOUS | Status: AC
  Administered 2016-01-06: 200 mg via INTRAVENOUS
  Filled 2016-01-06: qty 40

## 2016-01-06 MED ORDER — PROCHLORPERAZINE EDISYLATE 5 MG/ML IJ SOLN
10.0000 mg | Freq: Once | INTRAMUSCULAR | Status: AC
  Administered 2016-01-06: 10 mg via INTRAVENOUS
  Filled 2016-01-06: qty 2

## 2016-01-06 MED ORDER — FLUOROURACIL CHEMO INJECTION 2.5 GM/50ML
400.0000 mg/m2 | Freq: Once | INTRAVENOUS | Status: AC
  Administered 2016-01-06: 950 mg via INTRAVENOUS
  Filled 2016-01-06: qty 19

## 2016-01-06 MED ORDER — PROCHLORPERAZINE EDISYLATE 5 MG/ML IJ SOLN: 10.0000 mg | INTRAMUSCULAR | Status: DC | PRN

## 2016-01-06 MED ORDER — SODIUM CHLORIDE 0.9 % IV SOLN
2400.0000 mg/m2 | INTRAVENOUS | Status: DC
  Administered 2016-01-06: 5800 mg via INTRAVENOUS
  Filled 2016-01-06: qty 116

## 2016-01-06 MED ORDER — DEXTROSE 5 % IV SOLN
Freq: Once | INTRAVENOUS | Status: AC
  Administered 2016-01-06: 11:00:00 via INTRAVENOUS
  Filled 2016-01-06: qty 1000

## 2016-01-06 MED ORDER — DEXAMETHASONE SODIUM PHOSPHATE 10 MG/ML IJ SOLN
10.0000 mg | Freq: Once | INTRAMUSCULAR | Status: AC
  Administered 2016-01-06: 10 mg via INTRAVENOUS
  Filled 2016-01-06: qty 1

## 2016-01-06 NOTE — Progress Notes (Signed)
Overheard pt complaining to his mother that he felt "light headed and I just don't feel right, I'm hot and sweaty and my head just don't feel right", VS taken and WDL, Dr Rogue Bussing notified and came to chairside. Pt instructed by this RN to try and eat/drink, given OJ d/t pt not eating/drinking "all day" per pt. Pt now eating sandwich and continues drinking fluids. Will continue to monitor.

## 2016-01-06 NOTE — Progress Notes (Signed)
Pt verbalized that he is feeling better and no longer light headed, educated pt/mother on importance nutrition/fluids, verbalized understanding.

## 2016-01-06 NOTE — Assessment & Plan Note (Signed)
s/p on neoadjuvant radiation chemotherapy- ypT3ypN1-stage III rectal cancer; negative margins. Start  adjuvant FOLFOX chemo.  # nausea/vomitting- G-1; resolved.   # Elevated HTN-  Improved   # FOLFOX Chemotherapy in 2 weeks CBC CMP/M.D.
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
## 2016-01-08 ENCOUNTER — Inpatient Hospital Stay: Payer: Medicaid Other | Attending: Internal Medicine

## 2016-01-08 DIAGNOSIS — E876 Hypokalemia: Secondary | ICD-10-CM | POA: Diagnosis not present

## 2016-01-08 DIAGNOSIS — Z79899 Other long term (current) drug therapy: Secondary | ICD-10-CM | POA: Diagnosis not present

## 2016-01-08 DIAGNOSIS — E785 Hyperlipidemia, unspecified: Secondary | ICD-10-CM | POA: Insufficient documentation

## 2016-01-08 DIAGNOSIS — Z7982 Long term (current) use of aspirin: Secondary | ICD-10-CM | POA: Diagnosis not present

## 2016-01-08 DIAGNOSIS — R200 Anesthesia of skin: Secondary | ICD-10-CM | POA: Diagnosis not present

## 2016-01-08 DIAGNOSIS — Z5111 Encounter for antineoplastic chemotherapy: Secondary | ICD-10-CM | POA: Insufficient documentation

## 2016-01-08 DIAGNOSIS — C20 Malignant neoplasm of rectum: Secondary | ICD-10-CM

## 2016-01-08 DIAGNOSIS — R202 Paresthesia of skin: Secondary | ICD-10-CM | POA: Insufficient documentation

## 2016-01-08 DIAGNOSIS — Z923 Personal history of irradiation: Secondary | ICD-10-CM | POA: Diagnosis not present

## 2016-01-08 DIAGNOSIS — H547 Unspecified visual loss: Secondary | ICD-10-CM | POA: Diagnosis not present

## 2016-01-08 DIAGNOSIS — Z87828 Personal history of other (healed) physical injury and trauma: Secondary | ICD-10-CM | POA: Insufficient documentation

## 2016-01-08 DIAGNOSIS — D649 Anemia, unspecified: Secondary | ICD-10-CM | POA: Diagnosis not present

## 2016-01-08 DIAGNOSIS — C801 Malignant (primary) neoplasm, unspecified: Secondary | ICD-10-CM

## 2016-01-08 DIAGNOSIS — Z800 Family history of malignant neoplasm of digestive organs: Secondary | ICD-10-CM | POA: Diagnosis not present

## 2016-01-08 DIAGNOSIS — R197 Diarrhea, unspecified: Secondary | ICD-10-CM | POA: Insufficient documentation

## 2016-01-08 DIAGNOSIS — I10 Essential (primary) hypertension: Secondary | ICD-10-CM | POA: Diagnosis not present

## 2016-01-08 MED ORDER — SODIUM CHLORIDE 0.9 % IJ SOLN
10.0000 mL | Freq: Once | INTRAMUSCULAR | Status: AC
  Administered 2016-01-08: 10 mL via INTRAVENOUS
  Filled 2016-01-08: qty 10

## 2016-01-08 MED ORDER — HEPARIN SOD (PORK) LOCK FLUSH 100 UNIT/ML IV SOLN
500.0000 [IU] | Freq: Once | INTRAVENOUS | Status: AC
  Administered 2016-01-08: 500 [IU] via INTRAVENOUS

## 2016-01-08 MED ORDER — HEPARIN SOD (PORK) LOCK FLUSH 100 UNIT/ML IV SOLN
INTRAVENOUS | Status: AC
  Filled 2016-01-08: qty 5

## 2016-01-20 ENCOUNTER — Inpatient Hospital Stay: Payer: Medicaid Other

## 2016-01-20 ENCOUNTER — Inpatient Hospital Stay (HOSPITAL_BASED_OUTPATIENT_CLINIC_OR_DEPARTMENT_OTHER): Payer: Medicaid Other | Admitting: Internal Medicine

## 2016-01-20 VITALS — BP 130/87 | HR 92 | Temp 97.1°F | Resp 18 | Wt 234.9 lb

## 2016-01-20 DIAGNOSIS — Z79899 Other long term (current) drug therapy: Secondary | ICD-10-CM

## 2016-01-20 DIAGNOSIS — C20 Malignant neoplasm of rectum: Secondary | ICD-10-CM

## 2016-01-20 DIAGNOSIS — Z923 Personal history of irradiation: Secondary | ICD-10-CM

## 2016-01-20 DIAGNOSIS — Z5111 Encounter for antineoplastic chemotherapy: Secondary | ICD-10-CM | POA: Diagnosis not present

## 2016-01-20 DIAGNOSIS — C801 Malignant (primary) neoplasm, unspecified: Secondary | ICD-10-CM

## 2016-01-20 DIAGNOSIS — R202 Paresthesia of skin: Secondary | ICD-10-CM

## 2016-01-20 DIAGNOSIS — R197 Diarrhea, unspecified: Secondary | ICD-10-CM

## 2016-01-20 DIAGNOSIS — R200 Anesthesia of skin: Secondary | ICD-10-CM

## 2016-01-20 DIAGNOSIS — Z7982 Long term (current) use of aspirin: Secondary | ICD-10-CM

## 2016-01-20 DIAGNOSIS — I10 Essential (primary) hypertension: Secondary | ICD-10-CM

## 2016-01-20 DIAGNOSIS — Z87828 Personal history of other (healed) physical injury and trauma: Secondary | ICD-10-CM

## 2016-01-20 DIAGNOSIS — E876 Hypokalemia: Secondary | ICD-10-CM | POA: Diagnosis not present

## 2016-01-20 DIAGNOSIS — Z800 Family history of malignant neoplasm of digestive organs: Secondary | ICD-10-CM

## 2016-01-20 DIAGNOSIS — D649 Anemia, unspecified: Secondary | ICD-10-CM

## 2016-01-20 DIAGNOSIS — E785 Hyperlipidemia, unspecified: Secondary | ICD-10-CM

## 2016-01-20 DIAGNOSIS — H547 Unspecified visual loss: Secondary | ICD-10-CM

## 2016-01-20 LAB — CBC WITH DIFFERENTIAL/PLATELET
BASOS ABS: 0 10*3/uL (ref 0–0.1)
BASOS PCT: 1 %
EOS ABS: 0.1 10*3/uL (ref 0–0.7)
EOS PCT: 2 %
HEMATOCRIT: 38.2 % — AB (ref 40.0–52.0)
Hemoglobin: 12.6 g/dL — ABNORMAL LOW (ref 13.0–18.0)
Lymphocytes Relative: 35 %
Lymphs Abs: 1.7 10*3/uL (ref 1.0–3.6)
MCH: 27 pg (ref 26.0–34.0)
MCHC: 33 g/dL (ref 32.0–36.0)
MCV: 81.6 fL (ref 80.0–100.0)
MONO ABS: 0.3 10*3/uL (ref 0.2–1.0)
MONOS PCT: 7 %
Neutro Abs: 2.7 10*3/uL (ref 1.4–6.5)
Neutrophils Relative %: 55 %
PLATELETS: 210 10*3/uL (ref 150–440)
RBC: 4.69 MIL/uL (ref 4.40–5.90)
RDW: 16.9 % — AB (ref 11.5–14.5)
WBC: 4.8 10*3/uL (ref 3.8–10.6)

## 2016-01-20 LAB — COMPREHENSIVE METABOLIC PANEL
ALBUMIN: 4 g/dL (ref 3.5–5.0)
ALT: 42 U/L (ref 17–63)
ANION GAP: 8 (ref 5–15)
AST: 31 U/L (ref 15–41)
Alkaline Phosphatase: 85 U/L (ref 38–126)
BILIRUBIN TOTAL: 0.5 mg/dL (ref 0.3–1.2)
BUN: 15 mg/dL (ref 6–20)
CHLORIDE: 108 mmol/L (ref 101–111)
CO2: 21 mmol/L — ABNORMAL LOW (ref 22–32)
Calcium: 9 mg/dL (ref 8.9–10.3)
Creatinine, Ser: 0.97 mg/dL (ref 0.61–1.24)
GFR calc Af Amer: >60 mL/min (ref >60)
GFR calc non Af Amer: >60 mL/min (ref >60)
GLUCOSE: 136 mg/dL — AB (ref 65–99)
POTASSIUM: 3.3 mmol/L — AB (ref 3.5–5.1)
SODIUM: 137 mmol/L (ref 135–145)
TOTAL PROTEIN: 8.2 g/dL — AB (ref 6.5–8.1)

## 2016-01-20 MED ORDER — SODIUM CHLORIDE 0.9 % IV SOLN: 10.0000 mg | Freq: Once | INTRAVENOUS | Status: DC

## 2016-01-20 MED ORDER — LEUCOVORIN CALCIUM INJECTION 350 MG
950.0000 mg | Freq: Once | INTRAVENOUS | Status: AC
  Administered 2016-01-20: 950 mg via INTRAVENOUS
  Filled 2016-01-20: qty 17.5

## 2016-01-20 MED ORDER — DEXAMETHASONE SODIUM PHOSPHATE 10 MG/ML IJ SOLN
10.0000 mg | Freq: Once | INTRAMUSCULAR | Status: AC
  Administered 2016-01-20: 10 mg via INTRAVENOUS
  Filled 2016-01-20: qty 1

## 2016-01-20 MED ORDER — DEXTROSE 5 % IV SOLN
Freq: Once | INTRAVENOUS | Status: AC
  Administered 2016-01-20: 10:00:00 via INTRAVENOUS
  Filled 2016-01-20: qty 1000

## 2016-01-20 MED ORDER — HEPARIN SOD (PORK) LOCK FLUSH 100 UNIT/ML IV SOLN: 500.0000 [IU] | Freq: Once | INTRAVENOUS | Status: DC

## 2016-01-20 MED ORDER — SODIUM CHLORIDE 0.9 % IJ SOLN
10.0000 mL | Freq: Once | INTRAMUSCULAR | Status: AC
  Administered 2016-01-20: 10 mL via INTRAVENOUS
  Filled 2016-01-20: qty 10

## 2016-01-20 MED ORDER — OXALIPLATIN CHEMO INJECTION 100 MG/20ML
200.0000 mg | Freq: Once | INTRAVENOUS | Status: AC
  Administered 2016-01-20: 200 mg via INTRAVENOUS
  Filled 2016-01-20: qty 40

## 2016-01-20 MED ORDER — POTASSIUM CHLORIDE CRYS ER 20 MEQ PO TBCR: 20.0000 meq | EXTENDED_RELEASE_TABLET | Freq: Two times a day (BID) | ORAL | 3 refills | Status: DC

## 2016-01-20 MED ORDER — PALONOSETRON HCL INJECTION 0.25 MG/5ML
0.2500 mg | Freq: Once | INTRAVENOUS | Status: AC
  Administered 2016-01-20: 0.25 mg via INTRAVENOUS
  Filled 2016-01-20: qty 5

## 2016-01-20 MED ORDER — SODIUM CHLORIDE 0.9 % IV SOLN
2400.0000 mg/m2 | INTRAVENOUS | Status: DC
  Administered 2016-01-20: 5800 mg via INTRAVENOUS
  Filled 2016-01-20: qty 116

## 2016-01-20 NOTE — Progress Notes (Signed)
Patient is here for follow up, he is doing well no complaints Question about chemo

## 2016-01-20 NOTE — Assessment & Plan Note (Addendum)
s/p on neoadjuvant radiation chemotherapy- ypT3ypN1-stage III rectal cancer; negative margins. On adjuvant FOLFOX s/p #1.  # Proceed with cycle #2; discontinue bolus 5FU [sec or diarrhea]  # Diarrhea- G-1-2.  recommend adding immodium.   # Hypokalemia- K 3.3- supp K at home. Script given.   # nausea/vomitting- G-1; resolved.   # FOLFOX Chemotherapy in 2 weeks CBC CMP/M.D [covering provider].
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
## 2016-01-22 ENCOUNTER — Inpatient Hospital Stay: Payer: Medicaid Other

## 2016-01-22 VITALS — BP 137/88 | HR 96 | Temp 96.5°F | Resp 18

## 2016-01-22 DIAGNOSIS — C20 Malignant neoplasm of rectum: Secondary | ICD-10-CM

## 2016-01-22 DIAGNOSIS — C801 Malignant (primary) neoplasm, unspecified: Secondary | ICD-10-CM

## 2016-01-22 DIAGNOSIS — Z5111 Encounter for antineoplastic chemotherapy: Secondary | ICD-10-CM | POA: Diagnosis not present

## 2016-01-22 MED ORDER — SODIUM CHLORIDE 0.9% FLUSH
10.0000 mL | INTRAVENOUS | Status: DC | PRN
  Administered 2016-01-22: 10 mL
  Filled 2016-01-22: qty 10

## 2016-01-22 MED ORDER — HEPARIN SOD (PORK) LOCK FLUSH 100 UNIT/ML IV SOLN
500.0000 [IU] | Freq: Once | INTRAVENOUS | Status: AC | PRN
  Administered 2016-01-22: 500 [IU]
  Filled 2016-01-22: qty 5

## 2016-02-02 ENCOUNTER — Other Ambulatory Visit: Payer: Self-pay | Admitting: Oncology

## 2016-02-03 ENCOUNTER — Inpatient Hospital Stay (HOSPITAL_BASED_OUTPATIENT_CLINIC_OR_DEPARTMENT_OTHER): Payer: Medicaid Other | Admitting: Oncology

## 2016-02-03 ENCOUNTER — Inpatient Hospital Stay: Payer: Medicaid Other

## 2016-02-03 VITALS — BP 158/111 | HR 86 | Temp 96.8°F | Resp 18 | Wt 234.1 lb

## 2016-02-03 DIAGNOSIS — R197 Diarrhea, unspecified: Secondary | ICD-10-CM

## 2016-02-03 DIAGNOSIS — Z5111 Encounter for antineoplastic chemotherapy: Secondary | ICD-10-CM | POA: Diagnosis not present

## 2016-02-03 DIAGNOSIS — Z79899 Other long term (current) drug therapy: Secondary | ICD-10-CM

## 2016-02-03 DIAGNOSIS — C20 Malignant neoplasm of rectum: Secondary | ICD-10-CM

## 2016-02-03 DIAGNOSIS — E876 Hypokalemia: Secondary | ICD-10-CM | POA: Diagnosis not present

## 2016-02-03 DIAGNOSIS — Z87828 Personal history of other (healed) physical injury and trauma: Secondary | ICD-10-CM

## 2016-02-03 DIAGNOSIS — C801 Malignant (primary) neoplasm, unspecified: Secondary | ICD-10-CM

## 2016-02-03 DIAGNOSIS — E785 Hyperlipidemia, unspecified: Secondary | ICD-10-CM

## 2016-02-03 DIAGNOSIS — R200 Anesthesia of skin: Secondary | ICD-10-CM | POA: Diagnosis not present

## 2016-02-03 DIAGNOSIS — Z7982 Long term (current) use of aspirin: Secondary | ICD-10-CM

## 2016-02-03 DIAGNOSIS — D649 Anemia, unspecified: Secondary | ICD-10-CM

## 2016-02-03 DIAGNOSIS — R202 Paresthesia of skin: Secondary | ICD-10-CM

## 2016-02-03 DIAGNOSIS — Z923 Personal history of irradiation: Secondary | ICD-10-CM

## 2016-02-03 DIAGNOSIS — Z800 Family history of malignant neoplasm of digestive organs: Secondary | ICD-10-CM

## 2016-02-03 DIAGNOSIS — H547 Unspecified visual loss: Secondary | ICD-10-CM

## 2016-02-03 DIAGNOSIS — I10 Essential (primary) hypertension: Secondary | ICD-10-CM

## 2016-02-03 LAB — CBC WITH DIFFERENTIAL/PLATELET
BASOS ABS: 0 10*3/uL (ref 0–0.1)
Basophils Relative: 1 %
EOS ABS: 0.1 10*3/uL (ref 0–0.7)
EOS PCT: 2 %
HCT: 38.7 % — ABNORMAL LOW (ref 40.0–52.0)
Hemoglobin: 12.9 g/dL — ABNORMAL LOW (ref 13.0–18.0)
LYMPHS ABS: 1.5 10*3/uL (ref 1.0–3.6)
LYMPHS PCT: 29 %
MCH: 27.2 pg (ref 26.0–34.0)
MCHC: 33.2 g/dL (ref 32.0–36.0)
MCV: 81.9 fL (ref 80.0–100.0)
MONO ABS: 0.6 10*3/uL (ref 0.2–1.0)
Monocytes Relative: 11 %
Neutro Abs: 3.1 10*3/uL (ref 1.4–6.5)
Neutrophils Relative %: 57 %
PLATELETS: 172 10*3/uL (ref 150–440)
RBC: 4.72 MIL/uL (ref 4.40–5.90)
RDW: 17 % — AB (ref 11.5–14.5)
WBC: 5.4 10*3/uL (ref 3.8–10.6)

## 2016-02-03 LAB — COMPREHENSIVE METABOLIC PANEL WITH GFR
ALT: 37 U/L (ref 17–63)
AST: 23 U/L (ref 15–41)
Albumin: 3.9 g/dL (ref 3.5–5.0)
Alkaline Phosphatase: 79 U/L (ref 38–126)
Anion gap: 7 (ref 5–15)
BUN: 13 mg/dL (ref 6–20)
CO2: 22 mmol/L (ref 22–32)
Calcium: 8.9 mg/dL (ref 8.9–10.3)
Chloride: 109 mmol/L (ref 101–111)
Creatinine, Ser: 0.99 mg/dL (ref 0.61–1.24)
GFR calc Af Amer: >60 mL/min
GFR calc non Af Amer: >60 mL/min
Glucose, Bld: 108 mg/dL — ABNORMAL HIGH (ref 65–99)
Potassium: 3.5 mmol/L (ref 3.5–5.1)
Sodium: 138 mmol/L (ref 135–145)
Total Bilirubin: 0.5 mg/dL (ref 0.3–1.2)
Total Protein: 7.7 g/dL (ref 6.5–8.1)

## 2016-02-03 MED ORDER — HEPARIN SOD (PORK) LOCK FLUSH 100 UNIT/ML IV SOLN: 500.0000 [IU] | Freq: Once | INTRAVENOUS | Status: DC

## 2016-02-03 MED ORDER — DEXTROSE 5 % IV SOLN
950.0000 mg | Freq: Once | INTRAVENOUS | Status: AC
  Administered 2016-02-03: 950 mg via INTRAVENOUS
  Filled 2016-02-03: qty 47.5

## 2016-02-03 MED ORDER — DEXAMETHASONE SODIUM PHOSPHATE 10 MG/ML IJ SOLN
10.0000 mg | Freq: Once | INTRAMUSCULAR | Status: AC
  Administered 2016-02-03: 10 mg via INTRAVENOUS
  Filled 2016-02-03: qty 1

## 2016-02-03 MED ORDER — SODIUM CHLORIDE 0.9 % IJ SOLN
10.0000 mL | Freq: Once | INTRAMUSCULAR | Status: AC
  Administered 2016-02-03: 10 mL via INTRAVENOUS
  Filled 2016-02-03: qty 10

## 2016-02-03 MED ORDER — DEXTROSE 5 % IV SOLN
Freq: Once | INTRAVENOUS | Status: AC
  Administered 2016-02-03: 10:00:00 via INTRAVENOUS
  Filled 2016-02-03: qty 1000

## 2016-02-03 MED ORDER — SODIUM CHLORIDE 0.9 % IV SOLN
2400.0000 mg/m2 | INTRAVENOUS | Status: DC
  Administered 2016-02-03: 5800 mg via INTRAVENOUS
  Filled 2016-02-03: qty 100

## 2016-02-03 MED ORDER — OXALIPLATIN CHEMO INJECTION 100 MG/20ML
200.0000 mg | Freq: Once | INTRAVENOUS | Status: AC
  Administered 2016-02-03: 200 mg via INTRAVENOUS
  Filled 2016-02-03: qty 40

## 2016-02-03 MED ORDER — SODIUM CHLORIDE 0.9 % IV SOLN: 10.0000 mg | Freq: Once | INTRAVENOUS | Status: DC

## 2016-02-03 MED ORDER — PALONOSETRON HCL INJECTION 0.25 MG/5ML
0.2500 mg | Freq: Once | INTRAVENOUS | Status: AC
  Administered 2016-02-03: 0.25 mg via INTRAVENOUS
  Filled 2016-02-03: qty 5

## 2016-02-03 NOTE — Progress Notes (Signed)
Offers no complaints  

## 2016-02-05 ENCOUNTER — Inpatient Hospital Stay: Payer: Medicaid Other

## 2016-02-05 VITALS — BP 142/97 | HR 87 | Temp 97.7°F | Resp 18

## 2016-02-05 DIAGNOSIS — C801 Malignant (primary) neoplasm, unspecified: Secondary | ICD-10-CM

## 2016-02-05 DIAGNOSIS — Z5111 Encounter for antineoplastic chemotherapy: Secondary | ICD-10-CM | POA: Diagnosis not present

## 2016-02-05 DIAGNOSIS — C20 Malignant neoplasm of rectum: Secondary | ICD-10-CM

## 2016-02-05 MED ORDER — HEPARIN SOD (PORK) LOCK FLUSH 100 UNIT/ML IV SOLN
500.0000 [IU] | Freq: Once | INTRAVENOUS | Status: AC | PRN
  Administered 2016-02-05: 500 [IU]
  Filled 2016-02-05: qty 5
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
## 2016-02-17 ENCOUNTER — Inpatient Hospital Stay: Payer: Medicaid Other

## 2016-02-17 ENCOUNTER — Inpatient Hospital Stay: Payer: Medicaid Other | Attending: Internal Medicine | Admitting: Internal Medicine

## 2016-02-17 VITALS — BP 136/94 | HR 93 | Temp 97.8°F | Wt 236.2 lb

## 2016-02-17 DIAGNOSIS — Z933 Colostomy status: Secondary | ICD-10-CM

## 2016-02-17 DIAGNOSIS — D649 Anemia, unspecified: Secondary | ICD-10-CM | POA: Diagnosis not present

## 2016-02-17 DIAGNOSIS — Z7982 Long term (current) use of aspirin: Secondary | ICD-10-CM | POA: Diagnosis not present

## 2016-02-17 DIAGNOSIS — Z87828 Personal history of other (healed) physical injury and trauma: Secondary | ICD-10-CM

## 2016-02-17 DIAGNOSIS — Z79899 Other long term (current) drug therapy: Secondary | ICD-10-CM

## 2016-02-17 DIAGNOSIS — H547 Unspecified visual loss: Secondary | ICD-10-CM | POA: Diagnosis not present

## 2016-02-17 DIAGNOSIS — C801 Malignant (primary) neoplasm, unspecified: Secondary | ICD-10-CM

## 2016-02-17 DIAGNOSIS — E876 Hypokalemia: Secondary | ICD-10-CM | POA: Diagnosis not present

## 2016-02-17 DIAGNOSIS — C20 Malignant neoplasm of rectum: Secondary | ICD-10-CM

## 2016-02-17 DIAGNOSIS — Z5111 Encounter for antineoplastic chemotherapy: Secondary | ICD-10-CM | POA: Insufficient documentation

## 2016-02-17 DIAGNOSIS — Z800 Family history of malignant neoplasm of digestive organs: Secondary | ICD-10-CM | POA: Diagnosis not present

## 2016-02-17 DIAGNOSIS — Z923 Personal history of irradiation: Secondary | ICD-10-CM | POA: Diagnosis not present

## 2016-02-17 DIAGNOSIS — E785 Hyperlipidemia, unspecified: Secondary | ICD-10-CM

## 2016-02-17 DIAGNOSIS — I10 Essential (primary) hypertension: Secondary | ICD-10-CM

## 2016-02-17 DIAGNOSIS — R200 Anesthesia of skin: Secondary | ICD-10-CM | POA: Diagnosis not present

## 2016-02-17 DIAGNOSIS — R112 Nausea with vomiting, unspecified: Secondary | ICD-10-CM | POA: Diagnosis not present

## 2016-02-17 DIAGNOSIS — R197 Diarrhea, unspecified: Secondary | ICD-10-CM | POA: Diagnosis not present

## 2016-02-17 DIAGNOSIS — R202 Paresthesia of skin: Secondary | ICD-10-CM | POA: Diagnosis not present

## 2016-02-17 LAB — CBC WITH DIFFERENTIAL/PLATELET
BASOS PCT: 1 %
Basophils Absolute: 0.1 10*3/uL (ref 0–0.1)
EOS ABS: 0.1 10*3/uL (ref 0–0.7)
Eosinophils Relative: 2 %
HEMATOCRIT: 38 % — AB (ref 40.0–52.0)
Hemoglobin: 12.8 g/dL — ABNORMAL LOW (ref 13.0–18.0)
LYMPHS ABS: 1.5 10*3/uL (ref 1.0–3.6)
Lymphocytes Relative: 25 %
MCH: 27.5 pg (ref 26.0–34.0)
MCHC: 33.7 g/dL (ref 32.0–36.0)
MCV: 81.6 fL (ref 80.0–100.0)
MONO ABS: 0.5 10*3/uL (ref 0.2–1.0)
MONOS PCT: 9 %
NEUTROS ABS: 3.8 10*3/uL (ref 1.4–6.5)
NEUTROS PCT: 63 %
Platelets: 137 10*3/uL — ABNORMAL LOW (ref 150–440)
RBC: 4.65 MIL/uL (ref 4.40–5.90)
RDW: 17.3 % — AB (ref 11.5–14.5)
WBC: 6 10*3/uL (ref 3.8–10.6)

## 2016-02-17 LAB — COMPREHENSIVE METABOLIC PANEL
ALT: 29 U/L (ref 17–63)
AST: 26 U/L (ref 15–41)
Albumin: 3.9 g/dL (ref 3.5–5.0)
Alkaline Phosphatase: 75 U/L (ref 38–126)
Anion gap: 8 (ref 5–15)
BUN: 11 mg/dL (ref 6–20)
CALCIUM: 9 mg/dL (ref 8.9–10.3)
CO2: 21 mmol/L — ABNORMAL LOW (ref 22–32)
Chloride: 109 mmol/L (ref 101–111)
Creatinine, Ser: 0.99 mg/dL (ref 0.61–1.24)
GFR calc non Af Amer: >60 mL/min (ref >60)
Glucose, Bld: 102 mg/dL — ABNORMAL HIGH (ref 65–99)
POTASSIUM: 3.1 mmol/L — AB (ref 3.5–5.1)
Sodium: 138 mmol/L (ref 135–145)
TOTAL PROTEIN: 7.8 g/dL (ref 6.5–8.1)
Total Bilirubin: 0.6 mg/dL (ref 0.3–1.2)

## 2016-02-17 MED ORDER — SODIUM CHLORIDE 0.9% FLUSH
10.0000 mL | INTRAVENOUS | Status: DC | PRN
  Administered 2016-02-17: 10 mL
  Filled 2016-02-17: qty 10

## 2016-02-17 MED ORDER — OXALIPLATIN CHEMO INJECTION 100 MG/20ML
60.0000 mg/m2 | Freq: Once | INTRAVENOUS | Status: AC
  Administered 2016-02-17: 145 mg via INTRAVENOUS
  Filled 2016-02-17: qty 9

## 2016-02-17 MED ORDER — DEXAMETHASONE SODIUM PHOSPHATE 10 MG/ML IJ SOLN
10.0000 mg | Freq: Once | INTRAMUSCULAR | Status: AC
  Administered 2016-02-17: 10 mg via INTRAVENOUS
  Filled 2016-02-17: qty 1

## 2016-02-17 MED ORDER — DEXTROSE 5 % IV SOLN
Freq: Once | INTRAVENOUS | Status: AC
  Administered 2016-02-17: 11:00:00 via INTRAVENOUS
  Filled 2016-02-17: qty 1000

## 2016-02-17 MED ORDER — DEXTROSE 5 % IV SOLN
950.0000 mg | Freq: Once | INTRAVENOUS | Status: AC
  Administered 2016-02-17: 950 mg via INTRAVENOUS
  Filled 2016-02-17: qty 30

## 2016-02-17 MED ORDER — SODIUM CHLORIDE 0.9 % IV SOLN
2400.0000 mg/m2 | INTRAVENOUS | Status: DC
  Administered 2016-02-17: 5800 mg via INTRAVENOUS
  Filled 2016-02-17: qty 100

## 2016-02-17 MED ORDER — PALONOSETRON HCL INJECTION 0.25 MG/5ML
0.2500 mg | Freq: Once | INTRAVENOUS | Status: AC
  Administered 2016-02-17: 0.25 mg via INTRAVENOUS
  Filled 2016-02-17: qty 5

## 2016-02-17 NOTE — Progress Notes (Signed)
Patient here today for follow up.  Patient c/o of neuropathy in hands and feet, nausea and vomiting

## 2016-02-17 NOTE — Assessment & Plan Note (Addendum)
s/p on neoadjuvant radiation chemotherapy- ypT3ypN1-stage III rectal cancer; negative margins. On adjuvant FOLFOX s/p # 3.  # Proceed with cycle #4; discontinue bolus 5FU [sec or diarrhea]; also decrease the dose of oxaliplatin to 60mg /m2.  Discussed that if neuropathy continues to be a problem- I would recommend discontinuation of oxaliplatin. However I would not want to discontinue as goal of treatment is cure. Labs reviewed acceptable for treatment.  # PN- decrease the dose of chemo /ox to 60mg /m2.   # Diarrhea- G-1. on imodium; improved.   # Hypokalemia- K 3.1- supp K at home.has pills at home.    # nausea/vomitting- G-1-2; needs to pick from pharmacy.   # FOLFOX Chemotherapy in 2 weeks CBC CMP/M.D
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
## 2016-02-19 ENCOUNTER — Inpatient Hospital Stay: Payer: Medicaid Other

## 2016-02-19 VITALS — BP 125/86 | HR 85 | Temp 97.6°F | Resp 20

## 2016-02-19 DIAGNOSIS — C801 Malignant (primary) neoplasm, unspecified: Secondary | ICD-10-CM

## 2016-02-19 DIAGNOSIS — C20 Malignant neoplasm of rectum: Secondary | ICD-10-CM

## 2016-02-19 DIAGNOSIS — Z5111 Encounter for antineoplastic chemotherapy: Secondary | ICD-10-CM | POA: Diagnosis not present

## 2016-02-19 MED ORDER — SODIUM CHLORIDE 0.9% FLUSH
10.0000 mL | INTRAVENOUS | Status: DC | PRN
Start: 2016-02-19 — End: 2016-02-19
  Administered 2016-02-19: 10 mL
  Filled 2016-02-19: qty 10

## 2016-02-19 MED ORDER — HEPARIN SOD (PORK) LOCK FLUSH 100 UNIT/ML IV SOLN
500.0000 [IU] | Freq: Once | INTRAVENOUS | Status: AC | PRN
  Administered 2016-02-19: 500 [IU]
  Filled 2016-02-19: qty 5

## 2016-02-19 NOTE — Progress Notes (Signed)
Patient reports an increase in fatigue and nausea/vomitting but these symptoms are no more than his usual after treatment.    I offered to call Dr. Rogue Bussing to discuss possible IVF and antemetic but patient declined.  Advised him to take the nausea meds he has available at home (he wasn't sure of the name) even if not feeling nauseas and try drinking plenty of fluids.

## 2016-03-02 ENCOUNTER — Inpatient Hospital Stay: Payer: Medicaid Other

## 2016-03-02 ENCOUNTER — Inpatient Hospital Stay (HOSPITAL_BASED_OUTPATIENT_CLINIC_OR_DEPARTMENT_OTHER): Payer: Medicaid Other | Admitting: Internal Medicine

## 2016-03-02 VITALS — BP 126/90 | HR 101 | Temp 97.2°F | Wt 238.2 lb

## 2016-03-02 DIAGNOSIS — R197 Diarrhea, unspecified: Secondary | ICD-10-CM

## 2016-03-02 DIAGNOSIS — R200 Anesthesia of skin: Secondary | ICD-10-CM

## 2016-03-02 DIAGNOSIS — E876 Hypokalemia: Secondary | ICD-10-CM

## 2016-03-02 DIAGNOSIS — Z87828 Personal history of other (healed) physical injury and trauma: Secondary | ICD-10-CM

## 2016-03-02 DIAGNOSIS — Z800 Family history of malignant neoplasm of digestive organs: Secondary | ICD-10-CM

## 2016-03-02 DIAGNOSIS — E785 Hyperlipidemia, unspecified: Secondary | ICD-10-CM

## 2016-03-02 DIAGNOSIS — R202 Paresthesia of skin: Secondary | ICD-10-CM

## 2016-03-02 DIAGNOSIS — C20 Malignant neoplasm of rectum: Secondary | ICD-10-CM | POA: Diagnosis not present

## 2016-03-02 DIAGNOSIS — I10 Essential (primary) hypertension: Secondary | ICD-10-CM

## 2016-03-02 DIAGNOSIS — R112 Nausea with vomiting, unspecified: Secondary | ICD-10-CM

## 2016-03-02 DIAGNOSIS — Z923 Personal history of irradiation: Secondary | ICD-10-CM

## 2016-03-02 DIAGNOSIS — Z7982 Long term (current) use of aspirin: Secondary | ICD-10-CM

## 2016-03-02 DIAGNOSIS — C801 Malignant (primary) neoplasm, unspecified: Secondary | ICD-10-CM

## 2016-03-02 DIAGNOSIS — H547 Unspecified visual loss: Secondary | ICD-10-CM

## 2016-03-02 DIAGNOSIS — Z79899 Other long term (current) drug therapy: Secondary | ICD-10-CM

## 2016-03-02 DIAGNOSIS — Z5111 Encounter for antineoplastic chemotherapy: Secondary | ICD-10-CM | POA: Diagnosis not present

## 2016-03-02 DIAGNOSIS — Z933 Colostomy status: Secondary | ICD-10-CM

## 2016-03-02 DIAGNOSIS — D649 Anemia, unspecified: Secondary | ICD-10-CM

## 2016-03-02 LAB — CBC WITH DIFFERENTIAL/PLATELET
BASOS PCT: 1 %
Basophils Absolute: 0.1 10*3/uL (ref 0–0.1)
Eosinophils Absolute: 0.1 10*3/uL (ref 0–0.7)
Eosinophils Relative: 1 %
HEMATOCRIT: 38.8 % — AB (ref 40.0–52.0)
HEMOGLOBIN: 13.1 g/dL (ref 13.0–18.0)
LYMPHS ABS: 1.4 10*3/uL (ref 1.0–3.6)
LYMPHS PCT: 29 %
MCH: 28 pg (ref 26.0–34.0)
MCHC: 33.8 g/dL (ref 32.0–36.0)
MCV: 82.8 fL (ref 80.0–100.0)
MONO ABS: 0.5 10*3/uL (ref 0.2–1.0)
MONOS PCT: 11 %
NEUTROS ABS: 2.7 10*3/uL (ref 1.4–6.5)
Neutrophils Relative %: 58 %
Platelets: 168 10*3/uL (ref 150–440)
RBC: 4.69 MIL/uL (ref 4.40–5.90)
RDW: 18.1 % — AB (ref 11.5–14.5)
WBC: 4.7 10*3/uL (ref 3.8–10.6)

## 2016-03-02 LAB — COMPREHENSIVE METABOLIC PANEL
ALBUMIN: 3.9 g/dL (ref 3.5–5.0)
ALK PHOS: 70 U/L (ref 38–126)
ALT: 44 U/L (ref 17–63)
ANION GAP: 6 (ref 5–15)
AST: 35 U/L (ref 15–41)
BILIRUBIN TOTAL: 0.7 mg/dL (ref 0.3–1.2)
BUN: 13 mg/dL (ref 6–20)
CALCIUM: 9.3 mg/dL (ref 8.9–10.3)
CO2: 25 mmol/L (ref 22–32)
Chloride: 106 mmol/L (ref 101–111)
Creatinine, Ser: 0.87 mg/dL (ref 0.61–1.24)
GFR calc Af Amer: >60 mL/min (ref >60)
Glucose, Bld: 102 mg/dL — ABNORMAL HIGH (ref 65–99)
Potassium: 3.3 mmol/L — ABNORMAL LOW (ref 3.5–5.1)
Sodium: 137 mmol/L (ref 135–145)
Total Protein: 8.2 g/dL — ABNORMAL HIGH (ref 6.5–8.1)

## 2016-03-02 MED ORDER — DEXTROSE 5 % IV SOLN
Freq: Once | INTRAVENOUS | Status: AC
  Administered 2016-03-02: 11:00:00 via INTRAVENOUS
  Filled 2016-03-02: qty 1000

## 2016-03-02 MED ORDER — OXALIPLATIN CHEMO INJECTION 100 MG/20ML
60.0000 mg/m2 | Freq: Once | INTRAVENOUS | Status: AC
  Administered 2016-03-02: 145 mg via INTRAVENOUS
  Filled 2016-03-02: qty 9

## 2016-03-02 MED ORDER — SODIUM CHLORIDE 0.9 % IV SOLN: 10.0000 mg | Freq: Once | INTRAVENOUS | Status: DC

## 2016-03-02 MED ORDER — PALONOSETRON HCL INJECTION 0.25 MG/5ML
0.2500 mg | Freq: Once | INTRAVENOUS | Status: AC
  Administered 2016-03-02: 0.25 mg via INTRAVENOUS
  Filled 2016-03-02: qty 5

## 2016-03-02 MED ORDER — DEXAMETHASONE SODIUM PHOSPHATE 10 MG/ML IJ SOLN
10.0000 mg | Freq: Once | INTRAMUSCULAR | Status: AC
  Administered 2016-03-02: 10 mg via INTRAVENOUS
  Filled 2016-03-02: qty 1

## 2016-03-02 MED ORDER — DEXTROSE 5 % IV SOLN
950.0000 mg | Freq: Once | INTRAVENOUS | Status: AC
  Administered 2016-03-02: 950 mg via INTRAVENOUS
  Filled 2016-03-02: qty 47.5

## 2016-03-02 MED ORDER — SODIUM CHLORIDE 0.9 % IV SOLN
2400.0000 mg/m2 | INTRAVENOUS | Status: DC
  Administered 2016-03-02: 5800 mg via INTRAVENOUS
  Filled 2016-03-02: qty 100

## 2016-03-02 MED ORDER — SODIUM CHLORIDE 0.9% FLUSH
10.0000 mL | INTRAVENOUS | Status: DC | PRN
  Administered 2016-03-02: 10 mL via INTRAVENOUS
  Filled 2016-03-02: qty 10

## 2016-03-02 MED ORDER — HEPARIN SOD (PORK) LOCK FLUSH 100 UNIT/ML IV SOLN
500.0000 [IU] | Freq: Once | INTRAVENOUS | Status: DC
  Filled 2016-03-02: qty 5

## 2016-03-02 NOTE — Progress Notes (Signed)
Patient here today for follow up.   

## 2016-03-02 NOTE — Assessment & Plan Note (Signed)
s/p on neoadjuvant radiation chemotherapy- ypT3ypN1-stage III rectal cancer; negative margins. On adjuvant FOLFOX s/p # 4. Tolerating better [improved diarrhea/ Ox- dose decreased to 60mg /m2]  # Proceed with cycle #5;labs okay;  # PN- decrease the dose of chemo /ox to 60mg /m2- improved.   # Diarrhea- G-1. on imodium; improved.   # Hypokalemia- K 3.3- supp K at home.    # nausea/vomitting- G-1-2;  Prn anti-emetics.   # FOLFOX Chemotherapy in 2 weeks CBC CMP/M.D
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
## 2016-03-04 ENCOUNTER — Inpatient Hospital Stay: Payer: Medicaid Other

## 2016-03-04 VITALS — BP 142/93 | HR 93 | Temp 98.9°F

## 2016-03-04 DIAGNOSIS — C20 Malignant neoplasm of rectum: Secondary | ICD-10-CM

## 2016-03-04 DIAGNOSIS — C801 Malignant (primary) neoplasm, unspecified: Secondary | ICD-10-CM

## 2016-03-04 DIAGNOSIS — Z5111 Encounter for antineoplastic chemotherapy: Secondary | ICD-10-CM | POA: Diagnosis not present

## 2016-03-04 MED ORDER — SODIUM CHLORIDE 0.9% FLUSH
10.0000 mL | INTRAVENOUS | Status: DC | PRN
  Administered 2016-03-04: 10 mL
  Filled 2016-03-04: qty 10

## 2016-03-04 MED ORDER — HEPARIN SOD (PORK) LOCK FLUSH 100 UNIT/ML IV SOLN
INTRAVENOUS | Status: AC
  Filled 2016-03-04: qty 5

## 2016-03-04 MED ORDER — HEPARIN SOD (PORK) LOCK FLUSH 100 UNIT/ML IV SOLN
500.0000 [IU] | Freq: Once | INTRAVENOUS | Status: AC | PRN
  Administered 2016-03-04: 500 [IU]

## 2016-03-16 ENCOUNTER — Inpatient Hospital Stay: Payer: Medicaid Other

## 2016-03-16 ENCOUNTER — Inpatient Hospital Stay: Payer: Medicaid Other | Attending: Internal Medicine | Admitting: Internal Medicine

## 2016-03-16 VITALS — BP 129/84 | HR 108 | Temp 96.8°F | Wt 232.1 lb

## 2016-03-16 DIAGNOSIS — C801 Malignant (primary) neoplasm, unspecified: Secondary | ICD-10-CM

## 2016-03-16 DIAGNOSIS — Z79899 Other long term (current) drug therapy: Secondary | ICD-10-CM

## 2016-03-16 DIAGNOSIS — C20 Malignant neoplasm of rectum: Secondary | ICD-10-CM

## 2016-03-16 DIAGNOSIS — Z7982 Long term (current) use of aspirin: Secondary | ICD-10-CM | POA: Diagnosis not present

## 2016-03-16 DIAGNOSIS — Z87828 Personal history of other (healed) physical injury and trauma: Secondary | ICD-10-CM

## 2016-03-16 DIAGNOSIS — E876 Hypokalemia: Secondary | ICD-10-CM | POA: Diagnosis not present

## 2016-03-16 DIAGNOSIS — R112 Nausea with vomiting, unspecified: Secondary | ICD-10-CM

## 2016-03-16 DIAGNOSIS — Z923 Personal history of irradiation: Secondary | ICD-10-CM

## 2016-03-16 DIAGNOSIS — Z800 Family history of malignant neoplasm of digestive organs: Secondary | ICD-10-CM | POA: Insufficient documentation

## 2016-03-16 DIAGNOSIS — Z5111 Encounter for antineoplastic chemotherapy: Secondary | ICD-10-CM | POA: Insufficient documentation

## 2016-03-16 DIAGNOSIS — I10 Essential (primary) hypertension: Secondary | ICD-10-CM

## 2016-03-16 DIAGNOSIS — E785 Hyperlipidemia, unspecified: Secondary | ICD-10-CM

## 2016-03-16 DIAGNOSIS — Z801 Family history of malignant neoplasm of trachea, bronchus and lung: Secondary | ICD-10-CM

## 2016-03-16 DIAGNOSIS — R197 Diarrhea, unspecified: Secondary | ICD-10-CM

## 2016-03-16 LAB — CBC WITH DIFFERENTIAL/PLATELET
Basophils Absolute: 0.1 10*3/uL (ref 0–0.1)
Basophils Relative: 1 %
EOS ABS: 0.1 10*3/uL (ref 0–0.7)
Eosinophils Relative: 1 %
HCT: 41.5 % (ref 40.0–52.0)
Hemoglobin: 14.1 g/dL (ref 13.0–18.0)
LYMPHS ABS: 1.7 10*3/uL (ref 1.0–3.6)
LYMPHS PCT: 29 %
MCH: 28.4 pg (ref 26.0–34.0)
MCHC: 33.9 g/dL (ref 32.0–36.0)
MCV: 83.9 fL (ref 80.0–100.0)
MONO ABS: 0.7 10*3/uL (ref 0.2–1.0)
MONOS PCT: 12 %
Neutro Abs: 3.3 10*3/uL (ref 1.4–6.5)
Neutrophils Relative %: 57 %
PLATELETS: 241 10*3/uL (ref 150–440)
RBC: 4.95 MIL/uL (ref 4.40–5.90)
RDW: 19.3 % — AB (ref 11.5–14.5)
WBC: 5.8 10*3/uL (ref 3.8–10.6)

## 2016-03-16 LAB — COMPREHENSIVE METABOLIC PANEL
ALT: 36 U/L (ref 17–63)
ANION GAP: 7 (ref 5–15)
AST: 31 U/L (ref 15–41)
Albumin: 4.2 g/dL (ref 3.5–5.0)
Alkaline Phosphatase: 87 U/L (ref 38–126)
BUN: 16 mg/dL (ref 6–20)
CHLORIDE: 110 mmol/L (ref 101–111)
CO2: 22 mmol/L (ref 22–32)
CREATININE: 1 mg/dL (ref 0.61–1.24)
Calcium: 9.2 mg/dL (ref 8.9–10.3)
Glucose, Bld: 133 mg/dL — ABNORMAL HIGH (ref 65–99)
POTASSIUM: 3.5 mmol/L (ref 3.5–5.1)
SODIUM: 139 mmol/L (ref 135–145)
Total Bilirubin: 0.4 mg/dL (ref 0.3–1.2)
Total Protein: 8.7 g/dL — ABNORMAL HIGH (ref 6.5–8.1)

## 2016-03-16 MED ORDER — DEXTROSE 5 % IV SOLN
Freq: Once | INTRAVENOUS | Status: AC
  Administered 2016-03-16: 10:00:00 via INTRAVENOUS
  Filled 2016-03-16: qty 1000

## 2016-03-16 MED ORDER — LEUCOVORIN CALCIUM INJECTION 350 MG
950.0000 mg | Freq: Once | INTRAVENOUS | Status: AC
  Administered 2016-03-16: 950 mg via INTRAVENOUS
  Filled 2016-03-16: qty 17.5

## 2016-03-16 MED ORDER — SODIUM CHLORIDE 0.9 % IV SOLN
2400.0000 mg/m2 | INTRAVENOUS | Status: DC
  Administered 2016-03-16: 5800 mg via INTRAVENOUS
  Filled 2016-03-16: qty 100

## 2016-03-16 MED ORDER — SODIUM CHLORIDE 0.9 % IV SOLN: 10.0000 mg | Freq: Once | INTRAVENOUS | Status: DC

## 2016-03-16 MED ORDER — OXALIPLATIN CHEMO INJECTION 100 MG/20ML
60.0000 mg/m2 | Freq: Once | INTRAVENOUS | Status: AC
  Administered 2016-03-16: 145 mg via INTRAVENOUS
  Filled 2016-03-16: qty 20

## 2016-03-16 MED ORDER — DEXAMETHASONE SODIUM PHOSPHATE 10 MG/ML IJ SOLN
10.0000 mg | Freq: Once | INTRAMUSCULAR | Status: AC
  Administered 2016-03-16: 10 mg via INTRAVENOUS
  Filled 2016-03-16: qty 1

## 2016-03-16 MED ORDER — PALONOSETRON HCL INJECTION 0.25 MG/5ML
0.2500 mg | Freq: Once | INTRAVENOUS | Status: AC
  Administered 2016-03-16: 0.25 mg via INTRAVENOUS
  Filled 2016-03-16: qty 5

## 2016-03-16 NOTE — Assessment & Plan Note (Addendum)
s/p on neoadjuvant radiation chemotherapy- ypT3ypN1-stage III rectal cancer; negative margins. On adjuvant FOLFOX s/p # 5. Tolerating better [improved diarrhea/ Ox- dose decreased to 60mg /m2]  # Proceed with cycle #6 ;labs okay;  # PN- decrease the dose of chemo /ox to 60mg /m2- improved.   # Diarrhea- G-1. on imodium; improved.   # Hypokalemia- K 3.5- supp K at home.    # nausea/vomitting- G-1 improved.;  Prn anti-emetics.   # FOLFOX Chemotherapy in 2 weeks CBC CMP/M.D
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
## 2016-03-16 NOTE — Progress Notes (Signed)
Patient here today for follow up.  Patient states no new concerns today  

## 2016-03-18 ENCOUNTER — Inpatient Hospital Stay: Payer: Medicaid Other

## 2016-03-18 VITALS — BP 143/90 | HR 83 | Temp 98.4°F

## 2016-03-18 DIAGNOSIS — Z5111 Encounter for antineoplastic chemotherapy: Secondary | ICD-10-CM | POA: Diagnosis not present

## 2016-03-18 DIAGNOSIS — C801 Malignant (primary) neoplasm, unspecified: Secondary | ICD-10-CM

## 2016-03-18 DIAGNOSIS — C20 Malignant neoplasm of rectum: Secondary | ICD-10-CM

## 2016-03-18 MED ORDER — HEPARIN SOD (PORK) LOCK FLUSH 100 UNIT/ML IV SOLN
500.0000 [IU] | Freq: Once | INTRAVENOUS | Status: AC | PRN
  Administered 2016-03-18: 500 [IU]
  Filled 2016-03-18: qty 5

## 2016-03-18 MED ORDER — SODIUM CHLORIDE 0.9% FLUSH
10.0000 mL | INTRAVENOUS | Status: DC | PRN
Start: 2016-03-18 — End: 2016-03-18
  Administered 2016-03-18: 10 mL
  Filled 2016-03-18: qty 10

## 2016-03-30 ENCOUNTER — Inpatient Hospital Stay: Payer: Medicaid Other

## 2016-03-30 ENCOUNTER — Inpatient Hospital Stay (HOSPITAL_BASED_OUTPATIENT_CLINIC_OR_DEPARTMENT_OTHER): Payer: Medicaid Other | Admitting: Internal Medicine

## 2016-03-30 VITALS — BP 132/85 | HR 98 | Temp 97.8°F | Wt 238.0 lb

## 2016-03-30 DIAGNOSIS — C20 Malignant neoplasm of rectum: Secondary | ICD-10-CM

## 2016-03-30 DIAGNOSIS — Z923 Personal history of irradiation: Secondary | ICD-10-CM | POA: Diagnosis not present

## 2016-03-30 DIAGNOSIS — Z7982 Long term (current) use of aspirin: Secondary | ICD-10-CM | POA: Diagnosis not present

## 2016-03-30 DIAGNOSIS — R112 Nausea with vomiting, unspecified: Secondary | ICD-10-CM | POA: Diagnosis not present

## 2016-03-30 DIAGNOSIS — I10 Essential (primary) hypertension: Secondary | ICD-10-CM

## 2016-03-30 DIAGNOSIS — R197 Diarrhea, unspecified: Secondary | ICD-10-CM

## 2016-03-30 DIAGNOSIS — E876 Hypokalemia: Secondary | ICD-10-CM | POA: Diagnosis not present

## 2016-03-30 DIAGNOSIS — Z87828 Personal history of other (healed) physical injury and trauma: Secondary | ICD-10-CM

## 2016-03-30 DIAGNOSIS — Z79899 Other long term (current) drug therapy: Secondary | ICD-10-CM | POA: Diagnosis not present

## 2016-03-30 DIAGNOSIS — E785 Hyperlipidemia, unspecified: Secondary | ICD-10-CM | POA: Diagnosis not present

## 2016-03-30 DIAGNOSIS — Z800 Family history of malignant neoplasm of digestive organs: Secondary | ICD-10-CM | POA: Diagnosis not present

## 2016-03-30 DIAGNOSIS — Z5111 Encounter for antineoplastic chemotherapy: Secondary | ICD-10-CM | POA: Diagnosis not present

## 2016-03-30 LAB — COMPREHENSIVE METABOLIC PANEL
ALK PHOS: 73 U/L (ref 38–126)
ALT: 31 U/L (ref 17–63)
ANION GAP: 9 (ref 5–15)
AST: 33 U/L (ref 15–41)
Albumin: 3.9 g/dL (ref 3.5–5.0)
BILIRUBIN TOTAL: 0.6 mg/dL (ref 0.3–1.2)
BUN: 11 mg/dL (ref 6–20)
CO2: 23 mmol/L (ref 22–32)
Calcium: 9.1 mg/dL (ref 8.9–10.3)
Chloride: 108 mmol/L (ref 101–111)
Creatinine, Ser: 0.96 mg/dL (ref 0.61–1.24)
Glucose, Bld: 114 mg/dL — ABNORMAL HIGH (ref 65–99)
Potassium: 3.2 mmol/L — ABNORMAL LOW (ref 3.5–5.1)
Sodium: 140 mmol/L (ref 135–145)
TOTAL PROTEIN: 8.1 g/dL (ref 6.5–8.1)

## 2016-03-30 LAB — CBC WITH DIFFERENTIAL/PLATELET
Basophils Absolute: 0.1 10*3/uL (ref 0–0.1)
Basophils Relative: 1 %
EOS ABS: 0.1 10*3/uL (ref 0–0.7)
Eosinophils Relative: 2 %
HEMATOCRIT: 38.4 % — AB (ref 40.0–52.0)
HEMOGLOBIN: 13 g/dL (ref 13.0–18.0)
LYMPHS ABS: 1.4 10*3/uL (ref 1.0–3.6)
Lymphocytes Relative: 27 %
MCH: 28.5 pg (ref 26.0–34.0)
MCHC: 33.9 g/dL (ref 32.0–36.0)
MCV: 84 fL (ref 80.0–100.0)
MONOS PCT: 11 %
Monocytes Absolute: 0.6 10*3/uL (ref 0.2–1.0)
NEUTROS ABS: 3.1 10*3/uL (ref 1.4–6.5)
NEUTROS PCT: 59 %
Platelets: 170 10*3/uL (ref 150–440)
RBC: 4.57 MIL/uL (ref 4.40–5.90)
RDW: 19.9 % — ABNORMAL HIGH (ref 11.5–14.5)
WBC: 5.3 10*3/uL (ref 3.8–10.6)

## 2016-03-30 MED ORDER — DEXTROSE 5 % IV SOLN
60.0000 mg/m2 | Freq: Once | INTRAVENOUS | Status: AC
  Administered 2016-03-30: 145 mg via INTRAVENOUS
  Filled 2016-03-30: qty 10

## 2016-03-30 MED ORDER — SODIUM CHLORIDE 0.9% FLUSH
10.0000 mL | INTRAVENOUS | Status: DC | PRN
  Administered 2016-03-30: 10 mL
  Filled 2016-03-30: qty 10

## 2016-03-30 MED ORDER — DEXTROSE 5 % IV SOLN
Freq: Once | INTRAVENOUS | Status: AC
  Administered 2016-03-30: 10:00:00 via INTRAVENOUS
  Filled 2016-03-30: qty 1000

## 2016-03-30 MED ORDER — PALONOSETRON HCL INJECTION 0.25 MG/5ML
0.2500 mg | Freq: Once | INTRAVENOUS | Status: AC
  Administered 2016-03-30: 0.25 mg via INTRAVENOUS

## 2016-03-30 MED ORDER — DEXAMETHASONE SODIUM PHOSPHATE 10 MG/ML IJ SOLN
10.0000 mg | Freq: Once | INTRAMUSCULAR | Status: AC
  Administered 2016-03-30: 10 mg via INTRAVENOUS
  Filled 2016-03-30: qty 1

## 2016-03-30 MED ORDER — LEUCOVORIN CALCIUM INJECTION 350 MG
950.0000 mg | Freq: Once | INTRAVENOUS | Status: AC
  Administered 2016-03-30: 950 mg via INTRAVENOUS
  Filled 2016-03-30: qty 47.5

## 2016-03-30 MED ORDER — PROCHLORPERAZINE MALEATE 10 MG PO TABS: 10.0000 mg | ORAL_TABLET | Freq: Four times a day (QID) | ORAL | 1 refills | Status: DC | PRN

## 2016-03-30 MED ORDER — SODIUM CHLORIDE 0.9 % IV SOLN
2400.0000 mg/m2 | INTRAVENOUS | Status: DC
  Administered 2016-03-30: 5800 mg via INTRAVENOUS
  Filled 2016-03-30: qty 100

## 2016-03-30 MED ORDER — ONDANSETRON HCL 8 MG PO TABS: ORAL_TABLET | ORAL | 1 refills | Status: DC

## 2016-03-30 NOTE — Assessment & Plan Note (Signed)
s/p on neoadjuvant radiation chemotherapy- ypT3ypN1-stage III rectal cancer; negative margins. On adjuvant FOLFOX s/p # 6. Tolerating better [improved diarrhea/ Ox- dose decreased to 60mg /m2]  # Proceed with cycle #7 ;labs okay;  # PN- decrease the dose of chemo /ox to 60mg /m2- improved.   # Diarrhea- G-1. on imodium/lomotil; improved.   # Hypokalemia- K 3.2- supp K at home.; recommend taking again at home.   # nausea/vomitting- G-1; Prn anti-emetics prescribed today.   # FOLFOX Chemotherapy in 2 weeks CBC CMP/M.D
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
## 2016-03-30 NOTE — Progress Notes (Signed)
Patient here today for follow up.  Patient states no new concerns today  

## 2016-04-01 ENCOUNTER — Inpatient Hospital Stay: Payer: Medicaid Other

## 2016-04-01 VITALS — BP 132/87 | HR 91 | Temp 98.5°F | Resp 16

## 2016-04-01 DIAGNOSIS — Z5111 Encounter for antineoplastic chemotherapy: Secondary | ICD-10-CM | POA: Diagnosis not present

## 2016-04-01 DIAGNOSIS — C20 Malignant neoplasm of rectum: Secondary | ICD-10-CM

## 2016-04-01 MED ORDER — HEPARIN SOD (PORK) LOCK FLUSH 100 UNIT/ML IV SOLN
500.0000 [IU] | Freq: Once | INTRAVENOUS | Status: AC | PRN
  Administered 2016-04-01: 500 [IU]

## 2016-04-01 MED ORDER — SODIUM CHLORIDE 0.9% FLUSH
10.0000 mL | INTRAVENOUS | Status: DC | PRN
  Administered 2016-04-01: 10 mL
  Filled 2016-04-01: qty 10

## 2016-04-13 ENCOUNTER — Inpatient Hospital Stay: Payer: Medicaid Other

## 2016-04-13 ENCOUNTER — Inpatient Hospital Stay: Payer: Medicaid Other | Attending: Internal Medicine | Admitting: Internal Medicine

## 2016-04-13 VITALS — BP 118/83 | HR 101 | Temp 97.3°F | Wt 235.0 lb

## 2016-04-13 DIAGNOSIS — Z800 Family history of malignant neoplasm of digestive organs: Secondary | ICD-10-CM | POA: Diagnosis not present

## 2016-04-13 DIAGNOSIS — E785 Hyperlipidemia, unspecified: Secondary | ICD-10-CM | POA: Insufficient documentation

## 2016-04-13 DIAGNOSIS — D649 Anemia, unspecified: Secondary | ICD-10-CM | POA: Insufficient documentation

## 2016-04-13 DIAGNOSIS — H543 Unqualified visual loss, both eyes: Secondary | ICD-10-CM | POA: Diagnosis not present

## 2016-04-13 DIAGNOSIS — E876 Hypokalemia: Secondary | ICD-10-CM | POA: Insufficient documentation

## 2016-04-13 DIAGNOSIS — R197 Diarrhea, unspecified: Secondary | ICD-10-CM | POA: Diagnosis not present

## 2016-04-13 DIAGNOSIS — Z87828 Personal history of other (healed) physical injury and trauma: Secondary | ICD-10-CM | POA: Diagnosis not present

## 2016-04-13 DIAGNOSIS — Z79899 Other long term (current) drug therapy: Secondary | ICD-10-CM | POA: Insufficient documentation

## 2016-04-13 DIAGNOSIS — R112 Nausea with vomiting, unspecified: Secondary | ICD-10-CM | POA: Diagnosis not present

## 2016-04-13 DIAGNOSIS — C20 Malignant neoplasm of rectum: Secondary | ICD-10-CM

## 2016-04-13 DIAGNOSIS — Z7982 Long term (current) use of aspirin: Secondary | ICD-10-CM | POA: Diagnosis not present

## 2016-04-13 DIAGNOSIS — I10 Essential (primary) hypertension: Secondary | ICD-10-CM | POA: Diagnosis not present

## 2016-04-13 DIAGNOSIS — Z923 Personal history of irradiation: Secondary | ICD-10-CM | POA: Insufficient documentation

## 2016-04-13 DIAGNOSIS — Z5111 Encounter for antineoplastic chemotherapy: Secondary | ICD-10-CM | POA: Diagnosis present

## 2016-04-13 LAB — COMPREHENSIVE METABOLIC PANEL
ALT: 31 U/L (ref 17–63)
ANION GAP: 8 (ref 5–15)
AST: 30 U/L (ref 15–41)
Albumin: 4.2 g/dL (ref 3.5–5.0)
Alkaline Phosphatase: 84 U/L (ref 38–126)
BUN: 11 mg/dL (ref 6–20)
CHLORIDE: 105 mmol/L (ref 101–111)
CO2: 25 mmol/L (ref 22–32)
CREATININE: 0.87 mg/dL (ref 0.61–1.24)
Calcium: 9.1 mg/dL (ref 8.9–10.3)
Glucose, Bld: 110 mg/dL — ABNORMAL HIGH (ref 65–99)
POTASSIUM: 2.9 mmol/L — AB (ref 3.5–5.1)
SODIUM: 138 mmol/L (ref 135–145)
Total Bilirubin: 0.7 mg/dL (ref 0.3–1.2)
Total Protein: 8.3 g/dL — ABNORMAL HIGH (ref 6.5–8.1)

## 2016-04-13 LAB — CBC WITH DIFFERENTIAL/PLATELET
Basophils Absolute: 0 10*3/uL (ref 0–0.1)
Basophils Relative: 1 %
EOS ABS: 0.1 10*3/uL (ref 0–0.7)
Eosinophils Relative: 2 %
HCT: 40.5 % (ref 40.0–52.0)
Hemoglobin: 13.7 g/dL (ref 13.0–18.0)
LYMPHS ABS: 1.7 10*3/uL (ref 1.0–3.6)
LYMPHS PCT: 30 %
MCH: 28.8 pg (ref 26.0–34.0)
MCHC: 33.9 g/dL (ref 32.0–36.0)
MCV: 84.9 fL (ref 80.0–100.0)
MONO ABS: 0.6 10*3/uL (ref 0.2–1.0)
Monocytes Relative: 11 %
Neutro Abs: 3.2 10*3/uL (ref 1.4–6.5)
Neutrophils Relative %: 56 %
PLATELETS: 188 10*3/uL (ref 150–440)
RBC: 4.78 MIL/uL (ref 4.40–5.90)
RDW: 20.6 % — AB (ref 11.5–14.5)
WBC: 5.6 10*3/uL (ref 3.8–10.6)

## 2016-04-13 MED ORDER — SODIUM CHLORIDE 0.9 % IV SOLN
Freq: Once | INTRAVENOUS | Status: AC
  Administered 2016-04-13: 10:00:00 via INTRAVENOUS
  Filled 2016-04-13: qty 10

## 2016-04-13 MED ORDER — DEXTROSE 5 % IV SOLN
Freq: Once | INTRAVENOUS | Status: AC
  Administered 2016-04-13: 10:00:00 via INTRAVENOUS
  Filled 2016-04-13: qty 1000

## 2016-04-13 MED ORDER — HEPARIN SOD (PORK) LOCK FLUSH 100 UNIT/ML IV SOLN: 500.0000 [IU] | Freq: Once | INTRAVENOUS | Status: DC

## 2016-04-13 MED ORDER — SODIUM CHLORIDE 0.9% FLUSH
10.0000 mL | INTRAVENOUS | Status: DC | PRN
  Administered 2016-04-13: 10 mL via INTRAVENOUS
  Filled 2016-04-13: qty 10

## 2016-04-13 MED ORDER — OXALIPLATIN CHEMO INJECTION 100 MG/20ML
60.0000 mg/m2 | Freq: Once | INTRAVENOUS | Status: AC
  Administered 2016-04-13: 145 mg via INTRAVENOUS
  Filled 2016-04-13: qty 20

## 2016-04-13 MED ORDER — DEXAMETHASONE SODIUM PHOSPHATE 10 MG/ML IJ SOLN
10.0000 mg | Freq: Once | INTRAMUSCULAR | Status: AC
  Administered 2016-04-13: 10 mg via INTRAVENOUS
  Filled 2016-04-13: qty 1

## 2016-04-13 MED ORDER — DEXTROSE 5 % IV SOLN
950.0000 mg | Freq: Once | INTRAVENOUS | Status: AC
  Administered 2016-04-13: 950 mg via INTRAVENOUS
  Filled 2016-04-13: qty 30

## 2016-04-13 MED ORDER — SODIUM CHLORIDE 0.9% FLUSH
10.0000 mL | INTRAVENOUS | Status: DC | PRN
  Filled 2016-04-13: qty 10

## 2016-04-13 MED ORDER — SODIUM CHLORIDE 0.9 % IV SOLN: 10.0000 mg | Freq: Once | INTRAVENOUS | Status: DC

## 2016-04-13 MED ORDER — SODIUM CHLORIDE 0.9 % IV SOLN
2400.0000 mg/m2 | INTRAVENOUS | Status: DC
  Administered 2016-04-13: 5800 mg via INTRAVENOUS
  Filled 2016-04-13: qty 100

## 2016-04-13 MED ORDER — PALONOSETRON HCL INJECTION 0.25 MG/5ML
0.2500 mg | Freq: Once | INTRAVENOUS | Status: AC
  Administered 2016-04-13: 0.25 mg via INTRAVENOUS
  Filled 2016-04-13: qty 5

## 2016-04-13 NOTE — Progress Notes (Signed)
Patient here today for follow up.  Patient states no new concerns today  
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
## 2016-04-13 NOTE — Assessment & Plan Note (Addendum)
s/p on neoadjuvant radiation chemotherapy- ypT3ypN1-stage III rectal cancer; negative margins. On adjuvant FOLFOX s/p # 7. Tolerating better [improved diarrhea/ Ox- dose decreased to 60mg /m2]  # Proceed with cycle #8 Labs today reviewed;  acceptable for treatment today.   # PN- decrease the dose of chemo /ox to 60mg /m2- improved. Only cold sensitivity.   # Diarrhea- G-1. on imodium/lomotil; improved. # nausea/vomitting- G-1; Prn anti-emetics.   # Hypokalemia- K 2.9 today. supp K at home.; recommend taking again at home.   # FOLFOX Chemotherapy in 2 weeks CBC CMP/M.D; 4 W

## 2016-04-14 ENCOUNTER — Ambulatory Visit: Payer: Medicaid Other

## 2016-04-14 ENCOUNTER — Ambulatory Visit: Payer: Medicaid Other | Admitting: Internal Medicine

## 2016-04-14 ENCOUNTER — Other Ambulatory Visit: Payer: Medicaid Other

## 2016-04-15 ENCOUNTER — Inpatient Hospital Stay: Payer: Medicaid Other

## 2016-04-15 VITALS — BP 127/93 | HR 100 | Resp 20

## 2016-04-15 DIAGNOSIS — Z5111 Encounter for antineoplastic chemotherapy: Secondary | ICD-10-CM | POA: Diagnosis not present

## 2016-04-15 DIAGNOSIS — C20 Malignant neoplasm of rectum: Secondary | ICD-10-CM

## 2016-04-15 MED ORDER — HEPARIN SOD (PORK) LOCK FLUSH 100 UNIT/ML IV SOLN
500.0000 [IU] | Freq: Once | INTRAVENOUS | Status: AC | PRN
  Administered 2016-04-15: 500 [IU]
  Filled 2016-04-15: qty 5

## 2016-04-27 ENCOUNTER — Inpatient Hospital Stay: Payer: Medicaid Other

## 2016-04-27 ENCOUNTER — Inpatient Hospital Stay (HOSPITAL_BASED_OUTPATIENT_CLINIC_OR_DEPARTMENT_OTHER): Payer: Medicaid Other | Admitting: Internal Medicine

## 2016-04-27 VITALS — BP 129/87 | HR 87 | Temp 97.0°F | Resp 18 | Wt 237.0 lb

## 2016-04-27 DIAGNOSIS — Z923 Personal history of irradiation: Secondary | ICD-10-CM | POA: Diagnosis not present

## 2016-04-27 DIAGNOSIS — H543 Unqualified visual loss, both eyes: Secondary | ICD-10-CM

## 2016-04-27 DIAGNOSIS — Z79899 Other long term (current) drug therapy: Secondary | ICD-10-CM | POA: Diagnosis not present

## 2016-04-27 DIAGNOSIS — D649 Anemia, unspecified: Secondary | ICD-10-CM

## 2016-04-27 DIAGNOSIS — R112 Nausea with vomiting, unspecified: Secondary | ICD-10-CM

## 2016-04-27 DIAGNOSIS — C20 Malignant neoplasm of rectum: Secondary | ICD-10-CM

## 2016-04-27 DIAGNOSIS — R197 Diarrhea, unspecified: Secondary | ICD-10-CM

## 2016-04-27 DIAGNOSIS — I10 Essential (primary) hypertension: Secondary | ICD-10-CM

## 2016-04-27 DIAGNOSIS — Z800 Family history of malignant neoplasm of digestive organs: Secondary | ICD-10-CM

## 2016-04-27 DIAGNOSIS — Z7982 Long term (current) use of aspirin: Secondary | ICD-10-CM | POA: Diagnosis not present

## 2016-04-27 DIAGNOSIS — Z5111 Encounter for antineoplastic chemotherapy: Secondary | ICD-10-CM | POA: Diagnosis not present

## 2016-04-27 DIAGNOSIS — E785 Hyperlipidemia, unspecified: Secondary | ICD-10-CM | POA: Diagnosis not present

## 2016-04-27 DIAGNOSIS — E876 Hypokalemia: Secondary | ICD-10-CM

## 2016-04-27 DIAGNOSIS — Z87828 Personal history of other (healed) physical injury and trauma: Secondary | ICD-10-CM | POA: Diagnosis not present

## 2016-04-27 LAB — CBC WITH DIFFERENTIAL/PLATELET
BASOS ABS: 0 10*3/uL (ref 0–0.1)
Basophils Relative: 1 %
Eosinophils Absolute: 0.1 10*3/uL (ref 0–0.7)
Eosinophils Relative: 2 %
HEMATOCRIT: 39.7 % — AB (ref 40.0–52.0)
HEMOGLOBIN: 13.7 g/dL (ref 13.0–18.0)
LYMPHS ABS: 1.5 10*3/uL (ref 1.0–3.6)
LYMPHS PCT: 33 %
MCH: 29.4 pg (ref 26.0–34.0)
MCHC: 34.5 g/dL (ref 32.0–36.0)
MCV: 85.2 fL (ref 80.0–100.0)
Monocytes Absolute: 0.5 10*3/uL (ref 0.2–1.0)
Monocytes Relative: 12 %
NEUTROS ABS: 2.4 10*3/uL (ref 1.4–6.5)
NEUTROS PCT: 52 %
PLATELETS: 158 10*3/uL (ref 150–440)
RBC: 4.66 MIL/uL (ref 4.40–5.90)
RDW: 20.3 % — ABNORMAL HIGH (ref 11.5–14.5)
WBC: 4.6 10*3/uL (ref 3.8–10.6)

## 2016-04-27 LAB — COMPREHENSIVE METABOLIC PANEL
ALK PHOS: 78 U/L (ref 38–126)
ALT: 45 U/L (ref 17–63)
AST: 45 U/L — AB (ref 15–41)
Albumin: 3.9 g/dL (ref 3.5–5.0)
Anion gap: 9 (ref 5–15)
BILIRUBIN TOTAL: 0.6 mg/dL (ref 0.3–1.2)
BUN: 10 mg/dL (ref 6–20)
CALCIUM: 9.3 mg/dL (ref 8.9–10.3)
CHLORIDE: 105 mmol/L (ref 101–111)
CO2: 24 mmol/L (ref 22–32)
CREATININE: 1.06 mg/dL (ref 0.61–1.24)
Glucose, Bld: 119 mg/dL — ABNORMAL HIGH (ref 65–99)
Potassium: 3.4 mmol/L — ABNORMAL LOW (ref 3.5–5.1)
Sodium: 138 mmol/L (ref 135–145)
Total Protein: 8.2 g/dL — ABNORMAL HIGH (ref 6.5–8.1)

## 2016-04-27 MED ORDER — DEXAMETHASONE SODIUM PHOSPHATE 10 MG/ML IJ SOLN
10.0000 mg | Freq: Once | INTRAMUSCULAR | Status: AC
Start: 2016-04-27 — End: 2016-04-27
  Administered 2016-04-27: 10 mg via INTRAVENOUS
  Filled 2016-04-27: qty 1

## 2016-04-27 MED ORDER — HEPARIN SOD (PORK) LOCK FLUSH 100 UNIT/ML IV SOLN
500.0000 [IU] | Freq: Once | INTRAVENOUS | Status: DC
  Filled 2016-04-27: qty 5

## 2016-04-27 MED ORDER — DEXTROSE 5 % IV SOLN
Freq: Once | INTRAVENOUS | Status: AC
  Administered 2016-04-27: 10:00:00 via INTRAVENOUS
  Filled 2016-04-27: qty 1000

## 2016-04-27 MED ORDER — PALONOSETRON HCL INJECTION 0.25 MG/5ML
0.2500 mg | Freq: Once | INTRAVENOUS | Status: AC
  Administered 2016-04-27: 0.25 mg via INTRAVENOUS
  Filled 2016-04-27: qty 5

## 2016-04-27 MED ORDER — DEXTROSE 5 % IV SOLN
950.0000 mg | Freq: Once | INTRAVENOUS | Status: AC
  Administered 2016-04-27: 950 mg via INTRAVENOUS
  Filled 2016-04-27: qty 17.5

## 2016-04-27 MED ORDER — OXALIPLATIN CHEMO INJECTION 100 MG/20ML
60.0000 mg/m2 | Freq: Once | INTRAVENOUS | Status: AC
  Administered 2016-04-27: 145 mg via INTRAVENOUS
  Filled 2016-04-27: qty 20

## 2016-04-27 MED ORDER — SODIUM CHLORIDE 0.9 % IV SOLN
2400.0000 mg/m2 | INTRAVENOUS | Status: AC
  Administered 2016-04-27: 5800 mg via INTRAVENOUS
  Filled 2016-04-27: qty 116

## 2016-04-27 MED ORDER — POTASSIUM CHLORIDE CRYS ER 20 MEQ PO TBCR: 20.0000 meq | EXTENDED_RELEASE_TABLET | Freq: Two times a day (BID) | ORAL | 3 refills | Status: DC

## 2016-04-27 MED ORDER — SODIUM CHLORIDE 0.9% FLUSH
10.0000 mL | INTRAVENOUS | Status: DC | PRN
  Administered 2016-04-27: 10 mL via INTRAVENOUS
  Filled 2016-04-27: qty 10

## 2016-04-27 NOTE — Assessment & Plan Note (Addendum)
s/p on neoadjuvant radiation chemotherapy- ypT3ypN1-stage III rectal cancer; negative margins. On adjuvant FOLFOX s/p # 8. Tolerating better [improved diarrhea/ Ox- dose decreased to 60mg /m2]  # Proceed with cycle #9 Labs today reviewed;  acceptable for treatment today.   # PN- decrease the dose of chemo /ox to 60mg /m2- improved. Only cold sensitivity.   # Diarrhea- G-1. on imodium/lomotil; improved. # nausea/vomitting- G-1; Prn anti-emetics.   # Hypokalemia- K 3.4 today. supp K at home.; recommend taking again at home. New script given  # FOLFOX Chemotherapy in 2 weeks CBC CMP/M.D; will plan CT scan in 2-3 months.

## 2016-04-27 NOTE — Progress Notes (Signed)
Patient here today for follow up.  Patient states no new concerns today  
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
## 2016-04-29 ENCOUNTER — Inpatient Hospital Stay: Payer: Medicaid Other

## 2016-04-29 VITALS — BP 119/79 | HR 80 | Resp 20

## 2016-04-29 DIAGNOSIS — Z5111 Encounter for antineoplastic chemotherapy: Secondary | ICD-10-CM | POA: Diagnosis not present

## 2016-04-29 DIAGNOSIS — C20 Malignant neoplasm of rectum: Secondary | ICD-10-CM

## 2016-04-29 MED ORDER — HEPARIN SOD (PORK) LOCK FLUSH 100 UNIT/ML IV SOLN
500.0000 [IU] | Freq: Once | INTRAVENOUS | Status: AC | PRN
  Administered 2016-04-29: 500 [IU]
  Filled 2016-04-29: qty 5

## 2016-04-29 MED ORDER — SODIUM CHLORIDE 0.9% FLUSH
10.0000 mL | INTRAVENOUS | Status: DC | PRN
  Administered 2016-04-29: 10 mL
  Filled 2016-04-29: qty 10

## 2016-05-11 ENCOUNTER — Inpatient Hospital Stay: Payer: Medicaid Other

## 2016-05-11 ENCOUNTER — Inpatient Hospital Stay: Payer: Medicaid Other | Attending: Internal Medicine | Admitting: Internal Medicine

## 2016-05-11 VITALS — BP 134/90 | HR 94 | Temp 97.4°F | Resp 18 | Wt 240.0 lb

## 2016-05-11 DIAGNOSIS — E876 Hypokalemia: Secondary | ICD-10-CM | POA: Insufficient documentation

## 2016-05-11 DIAGNOSIS — Z7982 Long term (current) use of aspirin: Secondary | ICD-10-CM | POA: Insufficient documentation

## 2016-05-11 DIAGNOSIS — I10 Essential (primary) hypertension: Secondary | ICD-10-CM | POA: Diagnosis not present

## 2016-05-11 DIAGNOSIS — C20 Malignant neoplasm of rectum: Secondary | ICD-10-CM

## 2016-05-11 DIAGNOSIS — D649 Anemia, unspecified: Secondary | ICD-10-CM | POA: Diagnosis not present

## 2016-05-11 DIAGNOSIS — E785 Hyperlipidemia, unspecified: Secondary | ICD-10-CM | POA: Diagnosis not present

## 2016-05-11 DIAGNOSIS — R197 Diarrhea, unspecified: Secondary | ICD-10-CM | POA: Diagnosis not present

## 2016-05-11 DIAGNOSIS — Z79899 Other long term (current) drug therapy: Secondary | ICD-10-CM | POA: Diagnosis not present

## 2016-05-11 DIAGNOSIS — Z800 Family history of malignant neoplasm of digestive organs: Secondary | ICD-10-CM | POA: Insufficient documentation

## 2016-05-11 DIAGNOSIS — Z5111 Encounter for antineoplastic chemotherapy: Secondary | ICD-10-CM | POA: Insufficient documentation

## 2016-05-11 LAB — COMPREHENSIVE METABOLIC PANEL
ALBUMIN: 4 g/dL (ref 3.5–5.0)
ALK PHOS: 78 U/L (ref 38–126)
ALT: 43 U/L (ref 17–63)
ANION GAP: 6 (ref 5–15)
AST: 37 U/L (ref 15–41)
BUN: 11 mg/dL (ref 6–20)
CALCIUM: 9.3 mg/dL (ref 8.9–10.3)
CHLORIDE: 108 mmol/L (ref 101–111)
CO2: 25 mmol/L (ref 22–32)
CREATININE: 0.89 mg/dL (ref 0.61–1.24)
GFR calc non Af Amer: >60 mL/min (ref >60)
GLUCOSE: 104 mg/dL — AB (ref 65–99)
Potassium: 3.5 mmol/L (ref 3.5–5.1)
SODIUM: 139 mmol/L (ref 135–145)
Total Bilirubin: 0.7 mg/dL (ref 0.3–1.2)
Total Protein: 8 g/dL (ref 6.5–8.1)

## 2016-05-11 LAB — CBC WITH DIFFERENTIAL/PLATELET
Basophils Absolute: 0 10*3/uL (ref 0–0.1)
Basophils Relative: 1 %
EOS PCT: 2 %
Eosinophils Absolute: 0.1 10*3/uL (ref 0–0.7)
HEMATOCRIT: 38.8 % — AB (ref 40.0–52.0)
Hemoglobin: 13.3 g/dL (ref 13.0–18.0)
LYMPHS PCT: 27 %
Lymphs Abs: 1.5 10*3/uL (ref 1.0–3.6)
MCH: 30 pg (ref 26.0–34.0)
MCHC: 34.3 g/dL (ref 32.0–36.0)
MCV: 87.3 fL (ref 80.0–100.0)
MONOS PCT: 12 %
Monocytes Absolute: 0.7 10*3/uL (ref 0.2–1.0)
NEUTROS ABS: 3.3 10*3/uL (ref 1.4–6.5)
Neutrophils Relative %: 58 %
PLATELETS: 191 10*3/uL (ref 150–440)
RBC: 4.44 MIL/uL (ref 4.40–5.90)
RDW: 20.1 % — AB (ref 11.5–14.5)
WBC: 5.6 10*3/uL (ref 3.8–10.6)

## 2016-05-11 MED ORDER — OXALIPLATIN CHEMO INJECTION 100 MG/20ML
60.0000 mg/m2 | Freq: Once | INTRAVENOUS | Status: AC
  Administered 2016-05-11: 145 mg via INTRAVENOUS
  Filled 2016-05-11: qty 20

## 2016-05-11 MED ORDER — DEXAMETHASONE SODIUM PHOSPHATE 10 MG/ML IJ SOLN
10.0000 mg | Freq: Once | INTRAMUSCULAR | Status: AC
  Administered 2016-05-11: 10 mg via INTRAVENOUS
  Filled 2016-05-11: qty 1

## 2016-05-11 MED ORDER — SODIUM CHLORIDE 0.9 % IV SOLN: 10.0000 mg | Freq: Once | INTRAVENOUS | Status: DC

## 2016-05-11 MED ORDER — DEXTROSE 5 % IV SOLN
Freq: Once | INTRAVENOUS | Status: AC
  Administered 2016-05-11: 10:00:00 via INTRAVENOUS
  Filled 2016-05-11: qty 1000

## 2016-05-11 MED ORDER — LEUCOVORIN CALCIUM INJECTION 350 MG
950.0000 mg | Freq: Once | INTRAVENOUS | Status: AC
  Administered 2016-05-11: 950 mg via INTRAVENOUS
  Filled 2016-05-11: qty 47.5

## 2016-05-11 MED ORDER — SODIUM CHLORIDE 0.9 % IV SOLN
2400.0000 mg/m2 | INTRAVENOUS | Status: DC
  Administered 2016-05-11: 5800 mg via INTRAVENOUS
  Filled 2016-05-11: qty 20

## 2016-05-11 MED ORDER — HEPARIN SOD (PORK) LOCK FLUSH 100 UNIT/ML IV SOLN: 500.0000 [IU] | Freq: Once | INTRAVENOUS | Status: DC | PRN

## 2016-05-11 MED ORDER — PALONOSETRON HCL INJECTION 0.25 MG/5ML
0.2500 mg | Freq: Once | INTRAVENOUS | Status: AC
  Administered 2016-05-11: 0.25 mg via INTRAVENOUS
  Filled 2016-05-11: qty 5
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
## 2016-05-11 NOTE — Progress Notes (Signed)
Patient offers no complaints today.  BP elevated 134/90 HR 94.  Patient states he has been out of BP medication for a week. Will call PCP for refill.

## 2016-05-11 NOTE — Assessment & Plan Note (Addendum)
s/p on neoadjuvant radiation chemotherapy- ypT3ypN1-stage III rectal cancer; negative margins. On adjuvant FOLFOX s/p # 9 Tolerating better [improved diarrhea/ Ox- dose decreased to 60mg /m2]  # Proceed with cycle #10 Labs today reviewed;  acceptable for treatment today. Today will be his last dose of adjuvant chemotherapy.  # PN- decrease the dose of chemo /ox to 60mg /m2- improved. Only cold sensitivity.   # Diarrhea- G-1. on imodium/lomotil; improved. # nausea/vomitting- G-1; Prn anti-emetics.   # Hypokalemia- K 3.5  today. supp K at home.; recommend taking again at home.   # port- keep in for now; flush  # colostomy reversal- recommend follow up with surgeon in 2 months after the  [place].  # Follow up in 2 months/port flush; CBC CMP/ CEA; M.D;CT prior.   Cc; Dr.Tim Sadiq; [facility].

## 2016-05-13 ENCOUNTER — Inpatient Hospital Stay: Payer: Medicaid Other

## 2016-05-13 VITALS — BP 138/74 | HR 91 | Temp 98.2°F | Resp 18

## 2016-05-13 DIAGNOSIS — Z5111 Encounter for antineoplastic chemotherapy: Secondary | ICD-10-CM | POA: Diagnosis not present

## 2016-05-13 DIAGNOSIS — C20 Malignant neoplasm of rectum: Secondary | ICD-10-CM

## 2016-05-13 MED ORDER — SODIUM CHLORIDE 0.9% FLUSH
10.0000 mL | INTRAVENOUS | Status: DC | PRN
  Administered 2016-05-13: 10 mL via INTRAVENOUS
  Filled 2016-05-13: qty 10

## 2016-05-13 MED ORDER — HEPARIN SOD (PORK) LOCK FLUSH 100 UNIT/ML IV SOLN
500.0000 [IU] | Freq: Once | INTRAVENOUS | Status: AC
  Administered 2016-05-13: 500 [IU] via INTRAVENOUS

## 2016-05-13 MED ORDER — HEPARIN SOD (PORK) LOCK FLUSH 100 UNIT/ML IV SOLN
500.0000 [IU] | Freq: Once | INTRAVENOUS | Status: AC | PRN
  Filled 2016-05-13: qty 5

## 2016-05-13 MED ORDER — SODIUM CHLORIDE 0.9% FLUSH
10.0000 mL | INTRAVENOUS | Status: DC | PRN
  Filled 2016-05-13: qty 10

## 2016-06-09 ENCOUNTER — Emergency Department: Payer: Medicaid Other

## 2016-06-09 ENCOUNTER — Observation Stay
Admission: EM | Admit: 2016-06-09 | Discharge: 2016-06-10 | Disposition: A | Discharge status: Home / Self Care | Payer: Medicaid Other | Attending: Internal Medicine | Admitting: Internal Medicine

## 2016-06-09 DIAGNOSIS — N23 Unspecified renal colic: Secondary | ICD-10-CM

## 2016-06-09 DIAGNOSIS — Z885 Allergy status to narcotic agent status: Secondary | ICD-10-CM | POA: Insufficient documentation

## 2016-06-09 DIAGNOSIS — N281 Cyst of kidney, acquired: Secondary | ICD-10-CM | POA: Diagnosis not present

## 2016-06-09 DIAGNOSIS — E785 Hyperlipidemia, unspecified: Secondary | ICD-10-CM | POA: Insufficient documentation

## 2016-06-09 DIAGNOSIS — N179 Acute kidney failure, unspecified: Secondary | ICD-10-CM | POA: Diagnosis not present

## 2016-06-09 DIAGNOSIS — Z800 Family history of malignant neoplasm of digestive organs: Secondary | ICD-10-CM | POA: Diagnosis not present

## 2016-06-09 DIAGNOSIS — N132 Hydronephrosis with renal and ureteral calculous obstruction: Principal | ICD-10-CM | POA: Insufficient documentation

## 2016-06-09 DIAGNOSIS — I10 Essential (primary) hypertension: Secondary | ICD-10-CM | POA: Diagnosis not present

## 2016-06-09 DIAGNOSIS — C189 Malignant neoplasm of colon, unspecified: Secondary | ICD-10-CM | POA: Diagnosis not present

## 2016-06-09 DIAGNOSIS — N1330 Unspecified hydronephrosis: Secondary | ICD-10-CM | POA: Diagnosis present

## 2016-06-09 DIAGNOSIS — N289 Disorder of kidney and ureter, unspecified: Secondary | ICD-10-CM

## 2016-06-09 DIAGNOSIS — E876 Hypokalemia: Secondary | ICD-10-CM | POA: Diagnosis not present

## 2016-06-09 DIAGNOSIS — R52 Pain, unspecified: Secondary | ICD-10-CM

## 2016-06-09 DIAGNOSIS — N202 Calculus of kidney with calculus of ureter: Secondary | ICD-10-CM

## 2016-06-09 DIAGNOSIS — Z933 Colostomy status: Secondary | ICD-10-CM | POA: Diagnosis not present

## 2016-06-09 HISTORY — DX: Colostomy status: Z93.3

## 2016-06-09 LAB — URINALYSIS, COMPLETE (UACMP) WITH MICROSCOPIC
Bacteria, UA: NONE SEEN
Bilirubin Urine: NEGATIVE
GLUCOSE, UA: NEGATIVE mg/dL
Ketones, ur: NEGATIVE mg/dL
NITRITE: NEGATIVE
PH: 5 (ref 5.0–8.0)
PROTEIN: NEGATIVE mg/dL
SPECIFIC GRAVITY, URINE: 1.014 (ref 1.005–1.030)

## 2016-06-09 LAB — COMPREHENSIVE METABOLIC PANEL
ALT: 34 U/L (ref 17–63)
AST: 39 U/L (ref 15–41)
Albumin: 4.1 g/dL (ref 3.5–5.0)
Alkaline Phosphatase: 88 U/L (ref 38–126)
Anion gap: 11 (ref 5–15)
BUN: 15 mg/dL (ref 6–20)
CHLORIDE: 106 mmol/L (ref 101–111)
CO2: 23 mmol/L (ref 22–32)
CREATININE: 1.52 mg/dL — AB (ref 0.61–1.24)
Calcium: 9.3 mg/dL (ref 8.9–10.3)
GFR calc Af Amer: >60 mL/min (ref >60)
GFR, EST NON AFRICAN AMERICAN: 52 mL/min — AB (ref >60)
GLUCOSE: 133 mg/dL — AB (ref 65–99)
POTASSIUM: 3.3 mmol/L — AB (ref 3.5–5.1)
Sodium: 140 mmol/L (ref 135–145)
Total Bilirubin: 0.6 mg/dL (ref 0.3–1.2)
Total Protein: 8.5 g/dL — ABNORMAL HIGH (ref 6.5–8.1)

## 2016-06-09 LAB — CBC
HEMATOCRIT: 42.1 % (ref 40.0–52.0)
Hemoglobin: 14 g/dL (ref 13.0–18.0)
MCH: 30.1 pg (ref 26.0–34.0)
MCHC: 33.3 g/dL (ref 32.0–36.0)
MCV: 90.5 fL (ref 80.0–100.0)
PLATELETS: 222 10*3/uL (ref 150–440)
RBC: 4.65 MIL/uL (ref 4.40–5.90)
RDW: 17 % — AB (ref 11.5–14.5)
WBC: 10 10*3/uL (ref 3.8–10.6)

## 2016-06-09 LAB — LIPASE, BLOOD: LIPASE: 20 U/L (ref 11–51)

## 2016-06-09 LAB — MAGNESIUM: Magnesium: 1.7 mg/dL (ref 1.7–2.4)

## 2016-06-09 MED ORDER — POTASSIUM CHLORIDE CRYS ER 20 MEQ PO TBCR
20.0000 meq | EXTENDED_RELEASE_TABLET | Freq: Two times a day (BID) | ORAL | Status: DC
  Administered 2016-06-09 – 2016-06-10 (×2): 20 meq via ORAL
  Filled 2016-06-09 (×2): qty 1

## 2016-06-09 MED ORDER — ONDANSETRON HCL 4 MG/2ML IJ SOLN
4.0000 mg | Freq: Once | INTRAMUSCULAR | Status: AC
  Administered 2016-06-09: 4 mg via INTRAVENOUS
  Filled 2016-06-09: qty 2

## 2016-06-09 MED ORDER — TRAMADOL HCL 50 MG PO TABS: 50.0000 mg | ORAL_TABLET | Freq: Four times a day (QID) | ORAL | Status: DC | PRN

## 2016-06-09 MED ORDER — AMLODIPINE BESYLATE 5 MG PO TABS
5.0000 mg | ORAL_TABLET | ORAL | Status: DC
  Administered 2016-06-10: 5 mg via ORAL
  Filled 2016-06-09: qty 1

## 2016-06-09 MED ORDER — IOPAMIDOL (ISOVUE-300) INJECTION 61%
100.0000 mL | Freq: Once | INTRAVENOUS | Status: AC | PRN
  Administered 2016-06-09: 100 mL via INTRAVENOUS

## 2016-06-09 MED ORDER — OXYCODONE-ACETAMINOPHEN 5-325 MG PO TABS
1.0000 | ORAL_TABLET | ORAL | Status: DC | PRN
  Administered 2016-06-09 – 2016-06-10 (×3): 2 via ORAL
  Filled 2016-06-09 (×3): qty 2

## 2016-06-09 MED ORDER — SODIUM CHLORIDE 0.9 % IV SOLN
INTRAVENOUS | Status: DC
  Administered 2016-06-09 – 2016-06-10 (×3): via INTRAVENOUS

## 2016-06-09 MED ORDER — HYDROMORPHONE HCL 1 MG/ML IJ SOLN
INTRAMUSCULAR | Status: AC
  Administered 2016-06-09: 1 mg via INTRAVENOUS
  Filled 2016-06-09: qty 1

## 2016-06-09 MED ORDER — HYDROMORPHONE HCL 1 MG/ML IJ SOLN
1.0000 mg | Freq: Once | INTRAMUSCULAR | Status: AC
  Administered 2016-06-09: 1 mg via INTRAVENOUS
  Filled 2016-06-09: qty 1

## 2016-06-09 MED ORDER — SODIUM CHLORIDE 0.9 % IV BOLUS (SEPSIS)
1000.0000 mL | Freq: Once | INTRAVENOUS | Status: AC
  Administered 2016-06-09: 1000 mL via INTRAVENOUS

## 2016-06-09 MED ORDER — ALBUTEROL SULFATE (2.5 MG/3ML) 0.083% IN NEBU: 2.5000 mg | INHALATION_SOLUTION | RESPIRATORY_TRACT | Status: DC | PRN

## 2016-06-09 MED ORDER — ONDANSETRON HCL 4 MG/2ML IJ SOLN: 4.0000 mg | Freq: Four times a day (QID) | INTRAMUSCULAR | Status: DC | PRN

## 2016-06-09 MED ORDER — ONDANSETRON HCL 4 MG PO TABS: 4.0000 mg | ORAL_TABLET | Freq: Four times a day (QID) | ORAL | Status: DC | PRN

## 2016-06-09 MED ORDER — KETOROLAC TROMETHAMINE 30 MG/ML IJ SOLN: 30.0000 mg | Freq: Four times a day (QID) | INTRAMUSCULAR | Status: DC | PRN

## 2016-06-09 MED ORDER — HEPARIN SODIUM (PORCINE) 5000 UNIT/ML IJ SOLN
5000.0000 [IU] | Freq: Three times a day (TID) | INTRAMUSCULAR | Status: DC
  Administered 2016-06-09 – 2016-06-10 (×3): 5000 [IU] via SUBCUTANEOUS
  Filled 2016-06-09 (×3): qty 1

## 2016-06-09 MED ORDER — FENTANYL CITRATE (PF) 100 MCG/2ML IJ SOLN
75.0000 ug | Freq: Once | INTRAMUSCULAR | Status: AC
  Administered 2016-06-09: 75 ug via INTRAVENOUS
  Filled 2016-06-09: qty 2

## 2016-06-09 MED ORDER — IOPAMIDOL (ISOVUE-300) INJECTION 61%
30.0000 mL | Freq: Once | INTRAVENOUS | Status: AC | PRN
  Administered 2016-06-09: 30 mL via ORAL

## 2016-06-09 MED ORDER — TAMSULOSIN HCL 0.4 MG PO CAPS
0.4000 mg | ORAL_CAPSULE | Freq: Once | ORAL | Status: AC
  Administered 2016-06-09: 0.4 mg via ORAL
  Filled 2016-06-09: qty 1

## 2016-06-09 MED ORDER — HYDROMORPHONE HCL 1 MG/ML IJ SOLN
1.0000 mg | Freq: Once | INTRAMUSCULAR | Status: AC
  Administered 2016-06-09: 1 mg via INTRAVENOUS
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
## 2016-06-09 NOTE — ED Notes (Signed)
Dr. Chen at bedside at this time
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
## 2016-06-09 NOTE — ED Notes (Signed)
Pt returned from US at this time.

## 2016-06-09 NOTE — ED Notes (Signed)
Pt given 2 warm blankets per request.

## 2016-06-09 NOTE — Progress Notes (Signed)
CH responded to an OR for an AD. Pt presented in pain from kidney stones. [place] asked if this was a good time for education. Pt agreed. [place] educated the Pt on the AD. [place] asked if this was a good time for prayer. Pt agreed. CH provided the ministry of prayer. CH is available for follow up as needed.    06/09/16 1700  Clinical Encounter Type  Visited With Patient  Visit Type Initial;Spiritual support  Referral From Nurse  Consult/Referral To Chaplain  Spiritual Encounters  Spiritual Needs Literature;Prayer

## 2016-06-09 NOTE — ED Notes (Signed)
Pt taken to CT.

## 2016-06-09 NOTE — ED Triage Notes (Signed)
Pt c/o cramping abdominal pain that began yesterday. Pt states pain is around where colostomy is. Pt reports decreased stool production since yesterday when pain began. Denies nausea and vomiting. Pt alert and oriented X4, active, cooperative, pt in NAD. RR even and unlabored, color WNL.

## 2016-06-09 NOTE — ED Notes (Signed)
This RN to bedside, pt given another dose of Dilaudid. Pt asking questions regarding room assignment, this RN explained process, pt states understanding at this. VSS and WNL. Will continue to monitor for further patient needs.

## 2016-06-09 NOTE — ED Notes (Signed)
MD notified that patient had 1 episode of vomiting at this time, pt noted to have streaks of bright red in his blood. MD notified. Received orders for Zofran.

## 2016-06-09 NOTE — Consult Note (Signed)
Patient being admitted to hospital for uncontrolled abdominal pain. Patient with left 3 mm proximal ureteral stone with no sign of infection. Recommend trying to optimize pain management overnight. I have reserved OR time for tomorrow if pain remains uncontrolled in the morning. Will need to be NPO after midnight.  Will see and discuss this with the patient later this afternoon.

## 2016-06-09 NOTE — ED Notes (Signed)
NAD noted at this time. Pt resting in bed, requesting lights be turned off. Pt is alert and oriented, skin warm, dry, and intact. Will continue to monitor for further patient needs.
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
## 2016-06-09 NOTE — ED Notes (Signed)
Dr. Mariea Clonts to bedside at this time.

## 2016-06-10 ENCOUNTER — Telehealth: Payer: Self-pay | Admitting: Urology

## 2016-06-10 ENCOUNTER — Encounter
Admission: EM | Disposition: A | Discharge status: Home / Self Care | Payer: Self-pay | Source: Home / Self Care | Attending: Emergency Medicine

## 2016-06-10 DIAGNOSIS — N202 Calculus of kidney with calculus of ureter: Secondary | ICD-10-CM

## 2016-06-10 LAB — BASIC METABOLIC PANEL
Anion gap: 5 (ref 5–15)
BUN: 12 mg/dL (ref 6–20)
CALCIUM: 8.4 mg/dL — AB (ref 8.9–10.3)
CHLORIDE: 111 mmol/L (ref 101–111)
CO2: 25 mmol/L (ref 22–32)
CREATININE: 1.4 mg/dL — AB (ref 0.61–1.24)
GFR calc non Af Amer: 58 mL/min — ABNORMAL LOW (ref >60)
Glucose, Bld: 112 mg/dL — ABNORMAL HIGH (ref 65–99)
Potassium: 3.2 mmol/L — ABNORMAL LOW (ref 3.5–5.1)
Sodium: 141 mmol/L (ref 135–145)

## 2016-06-10 LAB — HIV ANTIBODY (ROUTINE TESTING W REFLEX): HIV Screen 4th Generation wRfx: NONREACTIVE

## 2016-06-10 SURGERY — URETEROSCOPY, WITH LITHOTRIPSY USING HOLMIUM LASER
Anesthesia: Choice | Laterality: Left

## 2016-06-10 MED ORDER — OXYCODONE-ACETAMINOPHEN 5-325 MG PO TABS: 1.0000 | ORAL_TABLET | ORAL | 0 refills | Status: DC | PRN

## 2016-06-10 MED ORDER — POTASSIUM CHLORIDE CRYS ER 20 MEQ PO TBCR: 40.0000 meq | EXTENDED_RELEASE_TABLET | Freq: Once | ORAL | Status: DC

## 2016-06-10 MED ORDER — TAMSULOSIN HCL 0.4 MG PO CAPS: 0.4000 mg | ORAL_CAPSULE | Freq: Every day | ORAL | 0 refills | Status: DC

## 2016-06-10 NOTE — Discharge Instructions (Signed)
Follow-up with primary care physician in 5-7 days. PCP to consider repeating BMP during follow-up visit Follow-up with urology in 10-14 days

## 2016-06-10 NOTE — Progress Notes (Addendum)
Patient required only PO pain meds x1 overnight No pain currently N/V resolved No stone caught in strainer  Vitals:   06/09/16 1549 06/09/16 2153 06/09/16 2154 06/10/16 0525  BP: (!) 166/99 (!) 170/90 (!) 158/97 139/85  Pulse: 83 75 72 74  Resp:  16 16 16   Temp: 97.8 F (36.6 C) 98 F (36.7 C) 98 F (36.7 C) 98.6 F (37 C)  TempSrc: Oral Oral Oral Oral  SpO2: 99% 99% 99% 97%  Weight:      Height:       I/O last 3 completed shifts: In: 1793.8 [I.V.:1793.8] Out: 1000 [Urine:900; Stool:100] No intake/output data recorded.   NAD Soft NT ND. No CVA tenderness No foley  BMP Latest Ref Rng & Units 06/10/2016 06/09/2016 05/11/2016  Glucose 65 - 99 mg/dL 112(H) 133(H) 104(H)  BUN 6 - 20 mg/dL 12 15 11   Creatinine 0.61 - 1.24 mg/dL 1.40(H) 1.52(H) 0.89  BUN/Creat Ratio 9 - 20 - - -  Sodium 135 - 145 mmol/L 141 140 139  Potassium 3.5 - 5.1 mmol/L 3.2(L) 3.3(L) 3.5  Chloride 101 - 111 mmol/L 111 106 108  CO2 22 - 32 mmol/L 25 23 25   Calcium 8.9 - 10.3 mg/dL 8.4(L) 9.3 9.3    1. Left 2.5 mm ureteral stone 2. Left 2 mm nonobstructing stone -Pain now very well controlled with minimal PO meds only. Patient has elected to continue medical expulsive therapy and not undergo surgery -diet resumed -ok for discharge home from urology standpoint -Rx for percocet and flomax placed in chart -will have patient f/u as outpatient with [facility] prior
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
## 2016-06-10 NOTE — Progress Notes (Signed)
Patient is NPO, urine output strained w/o kidney stone. Patient on PRN pain med for pin control, stable overnight w/o any acute event.

## 2016-06-10 NOTE — Telephone Encounter (Signed)
Left message for the patient to call back to confirm the app  [facility]

## 2016-06-10 NOTE — Telephone Encounter (Signed)
-----   Message from Nickie Retort, MD sent at 06/10/2016  7:43 AM EDT ----- Patient needs to see me in 2 weeks with KUB prior. thanks

## 2016-06-10 NOTE — Progress Notes (Signed)
06/10/2016  13:00  [name] to be D/C'd Home per MD order.  Discussed prescriptions and follow up appointments with the patient. Prescriptions given to patient, medication list explained in detail. Pt verbalized understanding.  Allergies as of 06/10/2016      Reactions   Vicodin [hydrocodone-acetaminophen] Itching      Medication List    STOP taking these medications   ondansetron 8 MG tablet Commonly known as:  ZOFRAN     TAKE these medications   amLODipine 5 MG tablet Commonly known as:  NORVASC Take 5 mg by mouth every morning.   loperamide 2 MG tablet Commonly known as:  IMODIUM A-D Take 1 tablet (2 mg total) by mouth 4 (four) times daily as needed for diarrhea or loose stools.   oxyCODONE-acetaminophen 5-325 MG tablet Commonly known as:  ROXICET Take 1-2 tablets by mouth every 4 (four) hours as needed for severe pain. Notes to patient:  Last administered 06/10/16 morning   potassium chloride SA 20 MEQ tablet Commonly known as:  K-DUR,KLOR-CON Take 1 tablet (20 mEq total) by mouth 2 (two) times daily. Notes to patient:  Last administered morning 06/10/16   prochlorperazine 10 MG tablet Commonly known as:  COMPAZINE Take 1 tablet (10 mg total) by mouth every 6 (six) hours as needed for nausea or vomiting.   tamsulosin 0.4 MG Caps capsule Commonly known as:  FLOMAX Take 1 capsule (0.4 mg total) by mouth daily. Notes to patient:  06/09/16 afternoon       Vitals:   06/10/16 0801 06/10/16 1203  BP: (!) 146/101 (!) 136/91  Pulse:  76  Resp:  20  Temp:  98.1 F (36.7 C)    Skin clean, dry and intact without evidence of skin break down, no evidence of skin tears noted. IV catheter discontinued intact. Site without signs and symptoms of complications. Dressing and pressure applied. Pt denies pain at this time. No complaints noted.  An After Visit Summary was printed and given to the patient. Patient escorted via [place], and D/C home via private auto.  [name] [name]

## 2016-06-16 ENCOUNTER — Emergency Department
Admission: EM | Admit: 2016-06-16 | Discharge: 2016-06-16 | Disposition: A | Discharge status: Home / Self Care | Payer: Medicaid Other | Attending: Emergency Medicine | Admitting: Emergency Medicine

## 2016-06-16 ENCOUNTER — Encounter: Payer: Self-pay | Admitting: Emergency Medicine

## 2016-06-16 DIAGNOSIS — Z79899 Other long term (current) drug therapy: Secondary | ICD-10-CM | POA: Insufficient documentation

## 2016-06-16 DIAGNOSIS — Z85038 Personal history of other malignant neoplasm of large intestine: Secondary | ICD-10-CM | POA: Diagnosis not present

## 2016-06-16 DIAGNOSIS — N201 Calculus of ureter: Secondary | ICD-10-CM | POA: Insufficient documentation

## 2016-06-16 DIAGNOSIS — N200 Calculus of kidney: Secondary | ICD-10-CM

## 2016-06-16 DIAGNOSIS — I10 Essential (primary) hypertension: Secondary | ICD-10-CM | POA: Insufficient documentation

## 2016-06-16 DIAGNOSIS — R1032 Left lower quadrant pain: Secondary | ICD-10-CM | POA: Diagnosis present

## 2016-06-16 LAB — URINALYSIS, COMPLETE (UACMP) WITH MICROSCOPIC
Bacteria, UA: NONE SEEN
Bilirubin Urine: NEGATIVE
Glucose, UA: NEGATIVE mg/dL
KETONES UR: NEGATIVE mg/dL
Leukocytes, UA: NEGATIVE
NITRITE: NEGATIVE
PH: 5 (ref 5.0–8.0)
Protein, ur: 30 mg/dL — AB
SPECIFIC GRAVITY, URINE: 1.017 (ref 1.005–1.030)

## 2016-06-16 LAB — BASIC METABOLIC PANEL
Anion gap: 7 (ref 5–15)
BUN: 17 mg/dL (ref 6–20)
CHLORIDE: 108 mmol/L (ref 101–111)
CO2: 22 mmol/L (ref 22–32)
Calcium: 9.1 mg/dL (ref 8.9–10.3)
Creatinine, Ser: 1.46 mg/dL — ABNORMAL HIGH (ref 0.61–1.24)
GFR calc Af Amer: >60 mL/min (ref >60)
GFR calc non Af Amer: 55 mL/min — ABNORMAL LOW (ref >60)
Glucose, Bld: 134 mg/dL — ABNORMAL HIGH (ref 65–99)
POTASSIUM: 3.5 mmol/L (ref 3.5–5.1)
SODIUM: 137 mmol/L (ref 135–145)

## 2016-06-16 LAB — CBC
HEMATOCRIT: 41.9 % (ref 40.0–52.0)
HEMOGLOBIN: 14.2 g/dL (ref 13.0–18.0)
MCH: 30.5 pg (ref 26.0–34.0)
MCHC: 33.9 g/dL (ref 32.0–36.0)
MCV: 89.8 fL (ref 80.0–100.0)
Platelets: 220 10*3/uL (ref 150–440)
RBC: 4.67 MIL/uL (ref 4.40–5.90)
RDW: 16.1 % — ABNORMAL HIGH (ref 11.5–14.5)
WBC: 8 10*3/uL (ref 3.8–10.6)

## 2016-06-16 MED ORDER — OXYCODONE-ACETAMINOPHEN 5-325 MG PO TABS: 1.0000 | ORAL_TABLET | Freq: Four times a day (QID) | ORAL | 0 refills | Status: DC | PRN

## 2016-06-16 MED ORDER — KETOROLAC TROMETHAMINE 60 MG/2ML IM SOLN
60.0000 mg | Freq: Once | INTRAMUSCULAR | Status: AC
  Administered 2016-06-16: 60 mg via INTRAMUSCULAR
  Filled 2016-06-16: qty 2

## 2016-06-16 MED ORDER — ONDANSETRON 4 MG PO TBDP
4.0000 mg | ORAL_TABLET | Freq: Once | ORAL | Status: AC
  Administered 2016-06-16: 4 mg via ORAL
  Filled 2016-06-16: qty 1

## 2016-06-16 MED ORDER — ONDANSETRON 4 MG PO TBDP: 4.0000 mg | ORAL_TABLET | Freq: Three times a day (TID) | ORAL | 0 refills | Status: DC | PRN

## 2016-06-16 NOTE — ED Notes (Signed)
Bladder scan completed. 75mL of urine in bladder. Pt did urinate prior to scan.

## 2016-06-16 NOTE — ED Notes (Signed)
Esign unavailable. Pt verbalized understanding of discharge instructions and has no questions at this time.
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
## 2016-06-16 NOTE — Discharge Instructions (Signed)
Please call the number provided for urology today or tomorrow to arrange a follow-up appointment for Monday or Tuesday of next week for consideration of possible lithotripsy if needed. Return to the emergency department for any worsening pain or fever.

## 2016-06-16 NOTE — ED Triage Notes (Signed)
Pt to ED from home c/o left flank pain since last night.  States was seen here last week dx with kidney stone.  States having urge to urinate with only a few drops.  States nausea and vomiting.

## 2016-06-17 LAB — URINE CULTURE: Culture: NO GROWTH

## 2016-06-23 ENCOUNTER — Ambulatory Visit: Payer: Self-pay | Admitting: Urology

## 2016-06-23 ENCOUNTER — Encounter: Payer: Self-pay | Admitting: Urology

## 2016-08-01 ENCOUNTER — Ambulatory Visit: Admission: RE | Admit: 2016-08-01 | Payer: Medicaid Other | Source: Ambulatory Visit

## 2016-08-03 ENCOUNTER — Inpatient Hospital Stay: Payer: Medicaid Other | Attending: Internal Medicine

## 2016-08-03 ENCOUNTER — Inpatient Hospital Stay (HOSPITAL_BASED_OUTPATIENT_CLINIC_OR_DEPARTMENT_OTHER): Payer: Medicaid Other | Admitting: Internal Medicine

## 2016-08-03 ENCOUNTER — Ambulatory Visit: Payer: Medicaid Other | Admitting: Internal Medicine

## 2016-08-03 ENCOUNTER — Other Ambulatory Visit: Payer: Medicaid Other

## 2016-08-03 VITALS — BP 125/85 | HR 88 | Temp 96.9°F | Wt 236.5 lb

## 2016-08-03 DIAGNOSIS — I10 Essential (primary) hypertension: Secondary | ICD-10-CM | POA: Insufficient documentation

## 2016-08-03 DIAGNOSIS — Z9221 Personal history of antineoplastic chemotherapy: Secondary | ICD-10-CM | POA: Diagnosis not present

## 2016-08-03 DIAGNOSIS — Z95828 Presence of other vascular implants and grafts: Secondary | ICD-10-CM

## 2016-08-03 DIAGNOSIS — Z933 Colostomy status: Secondary | ICD-10-CM

## 2016-08-03 DIAGNOSIS — G629 Polyneuropathy, unspecified: Secondary | ICD-10-CM | POA: Diagnosis not present

## 2016-08-03 DIAGNOSIS — Z79899 Other long term (current) drug therapy: Secondary | ICD-10-CM

## 2016-08-03 DIAGNOSIS — D649 Anemia, unspecified: Secondary | ICD-10-CM | POA: Insufficient documentation

## 2016-08-03 DIAGNOSIS — E785 Hyperlipidemia, unspecified: Secondary | ICD-10-CM | POA: Insufficient documentation

## 2016-08-03 DIAGNOSIS — Z87442 Personal history of urinary calculi: Secondary | ICD-10-CM

## 2016-08-03 DIAGNOSIS — E876 Hypokalemia: Secondary | ICD-10-CM | POA: Insufficient documentation

## 2016-08-03 DIAGNOSIS — Z85048 Personal history of other malignant neoplasm of rectum, rectosigmoid junction, and anus: Secondary | ICD-10-CM

## 2016-08-03 DIAGNOSIS — C20 Malignant neoplasm of rectum: Secondary | ICD-10-CM

## 2016-08-03 DIAGNOSIS — Z923 Personal history of irradiation: Secondary | ICD-10-CM | POA: Insufficient documentation

## 2016-08-03 LAB — COMPREHENSIVE METABOLIC PANEL
ALBUMIN: 4.2 g/dL (ref 3.5–5.0)
ALK PHOS: 90 U/L (ref 38–126)
ALT: 36 U/L (ref 17–63)
ANION GAP: 8 (ref 5–15)
AST: 30 U/L (ref 15–41)
BILIRUBIN TOTAL: 0.7 mg/dL (ref 0.3–1.2)
BUN: 15 mg/dL (ref 6–20)
CALCIUM: 9 mg/dL (ref 8.9–10.3)
CO2: 24 mmol/L (ref 22–32)
Chloride: 107 mmol/L (ref 101–111)
Creatinine, Ser: 1.12 mg/dL (ref 0.61–1.24)
GFR calc Af Amer: >60 mL/min (ref >60)
GFR calc non Af Amer: >60 mL/min (ref >60)
GLUCOSE: 116 mg/dL — AB (ref 65–99)
Potassium: 3.4 mmol/L — ABNORMAL LOW (ref 3.5–5.1)
SODIUM: 139 mmol/L (ref 135–145)
TOTAL PROTEIN: 8.2 g/dL — AB (ref 6.5–8.1)

## 2016-08-03 LAB — CBC WITH DIFFERENTIAL/PLATELET
BASOS ABS: 0 10*3/uL (ref 0–0.1)
BASOS PCT: 1 %
EOS ABS: 0.1 10*3/uL (ref 0–0.7)
Eosinophils Relative: 2 %
HEMATOCRIT: 41.4 % (ref 40.0–52.0)
HEMOGLOBIN: 14.3 g/dL (ref 13.0–18.0)
Lymphocytes Relative: 31 %
Lymphs Abs: 1.6 10*3/uL (ref 1.0–3.6)
MCH: 30.7 pg (ref 26.0–34.0)
MCHC: 34.5 g/dL (ref 32.0–36.0)
MCV: 89.1 fL (ref 80.0–100.0)
Monocytes Absolute: 0.4 10*3/uL (ref 0.2–1.0)
Monocytes Relative: 8 %
NEUTROS ABS: 3.1 10*3/uL (ref 1.4–6.5)
NEUTROS PCT: 58 %
Platelets: 215 10*3/uL (ref 150–440)
RBC: 4.65 MIL/uL (ref 4.40–5.90)
RDW: 14.2 % (ref 11.5–14.5)
WBC: 5.3 10*3/uL (ref 3.8–10.6)

## 2016-08-03 MED ORDER — HEPARIN SOD (PORK) LOCK FLUSH 100 UNIT/ML IV SOLN
500.0000 [IU] | Freq: Once | INTRAVENOUS | Status: AC
Start: 2016-08-03 — End: 2016-08-03
  Administered 2016-08-03: 500 [IU] via INTRAVENOUS

## 2016-08-03 MED ORDER — SODIUM CHLORIDE 0.9% FLUSH
10.0000 mL | INTRAVENOUS | Status: DC | PRN
  Administered 2016-08-03: 10 mL via INTRAVENOUS
  Filled 2016-08-03: qty 10

## 2016-08-03 NOTE — Progress Notes (Signed)
Patient here today for follow up.   
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
## 2016-08-03 NOTE — Assessment & Plan Note (Addendum)
s/p on neoadjuvant radiation chemotherapy- ypT3ypN1-stage III rectal cancer; negative margins. s/p adjuvant FOLFOX. Clinically no evidence of recurrence. CT scan [done in May 2018]- did not show any evidence of recurrence.   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 Declines Neurontin.  # Hypokalemia- K 3.5; improved; stop home suppl.   # port- discussed re: explantation; wants to keep for now.   # colostomy reversal- recommend follow up with surgeon at [facility]. Gave numbers for Dr.Sadiq for pt to call/and make appointments.   # port flush in 2 months; follow up with me in 4 months/ labs.   Cc; Dr.Tim Sadiq; [facility].

## 2016-08-04 ENCOUNTER — Ambulatory Visit: Payer: Medicaid Other

## 2016-08-04 LAB — CEA: CEA: 1.2 ng/mL (ref 0.0–4.7)

## 2016-08-08 ENCOUNTER — Ambulatory Visit: Payer: Medicaid Other | Admitting: Internal Medicine

## 2016-08-08 ENCOUNTER — Other Ambulatory Visit: Payer: Medicaid Other

## 2016-08-31 ENCOUNTER — Other Ambulatory Visit: Payer: Medicaid Other

## 2016-08-31 ENCOUNTER — Ambulatory Visit: Payer: Medicaid Other | Admitting: Internal Medicine

## 2016-09-14 ENCOUNTER — Inpatient Hospital Stay: Payer: Medicaid Other

## 2016-10-05 ENCOUNTER — Inpatient Hospital Stay: Payer: Medicaid Other | Attending: Internal Medicine

## 2016-10-05 DIAGNOSIS — Z85048 Personal history of other malignant neoplasm of rectum, rectosigmoid junction, and anus: Secondary | ICD-10-CM | POA: Insufficient documentation

## 2016-10-05 DIAGNOSIS — Z452 Encounter for adjustment and management of vascular access device: Secondary | ICD-10-CM | POA: Diagnosis not present

## 2016-10-05 DIAGNOSIS — Z95828 Presence of other vascular implants and grafts: Secondary | ICD-10-CM

## 2016-10-05 MED ORDER — HEPARIN SOD (PORK) LOCK FLUSH 100 UNIT/ML IV SOLN
500.0000 [IU] | Freq: Once | INTRAVENOUS | Status: AC
  Administered 2016-10-05: 500 [IU] via INTRAVENOUS

## 2016-10-05 MED ORDER — SODIUM CHLORIDE 0.9% FLUSH
10.0000 mL | INTRAVENOUS | Status: DC | PRN
  Administered 2016-10-05: 10 mL via INTRAVENOUS
  Filled 2016-10-05: qty 10

## 2016-11-30 ENCOUNTER — Inpatient Hospital Stay: Payer: Medicaid Other

## 2016-11-30 ENCOUNTER — Inpatient Hospital Stay: Payer: Medicaid Other | Admitting: Internal Medicine

## 2016-12-21 ENCOUNTER — Inpatient Hospital Stay (HOSPITAL_BASED_OUTPATIENT_CLINIC_OR_DEPARTMENT_OTHER): Payer: Medicaid Other | Admitting: Internal Medicine

## 2016-12-21 ENCOUNTER — Inpatient Hospital Stay: Payer: Medicaid Other | Attending: Internal Medicine

## 2016-12-21 VITALS — BP 156/119 | HR 131 | Temp 98.1°F | Resp 16 | Wt 233.0 lb

## 2016-12-21 DIAGNOSIS — R000 Tachycardia, unspecified: Secondary | ICD-10-CM

## 2016-12-21 DIAGNOSIS — Z923 Personal history of irradiation: Secondary | ICD-10-CM | POA: Insufficient documentation

## 2016-12-21 DIAGNOSIS — C20 Malignant neoplasm of rectum: Secondary | ICD-10-CM

## 2016-12-21 DIAGNOSIS — G629 Polyneuropathy, unspecified: Secondary | ICD-10-CM | POA: Diagnosis not present

## 2016-12-21 DIAGNOSIS — Z85048 Personal history of other malignant neoplasm of rectum, rectosigmoid junction, and anus: Secondary | ICD-10-CM | POA: Diagnosis not present

## 2016-12-21 DIAGNOSIS — I10 Essential (primary) hypertension: Secondary | ICD-10-CM | POA: Insufficient documentation

## 2016-12-21 DIAGNOSIS — Z9221 Personal history of antineoplastic chemotherapy: Secondary | ICD-10-CM | POA: Insufficient documentation

## 2016-12-21 DIAGNOSIS — E785 Hyperlipidemia, unspecified: Secondary | ICD-10-CM

## 2016-12-21 DIAGNOSIS — E876 Hypokalemia: Secondary | ICD-10-CM | POA: Insufficient documentation

## 2016-12-21 DIAGNOSIS — D649 Anemia, unspecified: Secondary | ICD-10-CM | POA: Diagnosis not present

## 2016-12-21 DIAGNOSIS — Z79899 Other long term (current) drug therapy: Secondary | ICD-10-CM

## 2016-12-21 LAB — CBC WITH DIFFERENTIAL/PLATELET
Basophils Absolute: 0 10*3/uL (ref 0–0.1)
Basophils Relative: 1 %
Eosinophils Absolute: 0.1 10*3/uL (ref 0–0.7)
Eosinophils Relative: 2 %
HEMATOCRIT: 44.9 % (ref 40.0–52.0)
HEMOGLOBIN: 15.1 g/dL (ref 13.0–18.0)
LYMPHS ABS: 1.6 10*3/uL (ref 1.0–3.6)
Lymphocytes Relative: 31 %
MCH: 28.8 pg (ref 26.0–34.0)
MCHC: 33.6 g/dL (ref 32.0–36.0)
MCV: 85.8 fL (ref 80.0–100.0)
MONOS PCT: 8 %
Monocytes Absolute: 0.4 10*3/uL (ref 0.2–1.0)
NEUTROS ABS: 2.9 10*3/uL (ref 1.4–6.5)
NEUTROS PCT: 58 %
Platelets: 214 10*3/uL (ref 150–440)
RBC: 5.24 MIL/uL (ref 4.40–5.90)
RDW: 14.9 % — ABNORMAL HIGH (ref 11.5–14.5)
WBC: 5 10*3/uL (ref 3.8–10.6)

## 2016-12-21 LAB — COMPREHENSIVE METABOLIC PANEL
ALT: 17 U/L (ref 17–63)
ANION GAP: 7 (ref 5–15)
AST: 19 U/L (ref 15–41)
Albumin: 4 g/dL (ref 3.5–5.0)
Alkaline Phosphatase: 77 U/L (ref 38–126)
BUN: 12 mg/dL (ref 6–20)
CHLORIDE: 99 mmol/L — AB (ref 101–111)
CO2: 30 mmol/L (ref 22–32)
CREATININE: 1.23 mg/dL (ref 0.61–1.24)
Calcium: 9 mg/dL (ref 8.9–10.3)
GFR calc non Af Amer: >60 mL/min (ref >60)
Glucose, Bld: 118 mg/dL — ABNORMAL HIGH (ref 65–99)
Potassium: 3 mmol/L — ABNORMAL LOW (ref 3.5–5.1)
SODIUM: 136 mmol/L (ref 135–145)
Total Bilirubin: 0.9 mg/dL (ref 0.3–1.2)
Total Protein: 8.1 g/dL (ref 6.5–8.1)

## 2016-12-21 MED ORDER — POTASSIUM CHLORIDE CRYS ER 20 MEQ PO TBCR: 20.0000 meq | EXTENDED_RELEASE_TABLET | Freq: Every day | ORAL | 3 refills | Status: DC

## 2016-12-21 MED ORDER — HEPARIN SOD (PORK) LOCK FLUSH 100 UNIT/ML IV SOLN
500.0000 [IU] | Freq: Once | INTRAVENOUS | Status: AC
  Administered 2016-12-21: 500 [IU] via INTRAVENOUS

## 2016-12-21 MED ORDER — METOPROLOL TARTRATE 25 MG PO TABS: 25.0000 mg | ORAL_TABLET | Freq: Two times a day (BID) | ORAL | 4 refills | Status: DC

## 2016-12-21 MED ORDER — GABAPENTIN 300 MG PO CAPS: ORAL_CAPSULE | ORAL | 0 refills | Status: DC

## 2016-12-21 MED ORDER — SODIUM CHLORIDE 0.9% FLUSH
10.0000 mL | Freq: Once | INTRAVENOUS | Status: AC
  Administered 2016-12-21: 10 mL via INTRAVENOUS
  Filled 2016-12-21: qty 10
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
## 2016-12-21 NOTE — Assessment & Plan Note (Addendum)
s/p on neoadjuvant radiation chemotherapy- ypT3ypN1-stage III rectal cancer; negative margins. s/p adjuvant FOLFOX.   # Clinically no evidence of recurrence. CT scan [done in May 2018]- did not show any evidence of recurrence.   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 A new prescription given.  # Hypokalemia- K 3.0; retstart K 20 every day.   # Elevated Blood pressure/tachycardia- repeat 141/103; pulse-116.added metoprolol 25 BID. Sent script to pharmacy.   # s/p colostomy reversal-    # port malfunction- ? Need for portogram; will order at next visit.   # follow up in 2 months/port flush/labs; CT scan prior.

## 2017-01-23 ENCOUNTER — Emergency Department
Admission: EM | Admit: 2017-01-23 | Discharge: 2017-01-23 | Disposition: A | Discharge status: Home / Self Care | Payer: Medicaid Other | Attending: Emergency Medicine | Admitting: Emergency Medicine

## 2017-01-23 ENCOUNTER — Emergency Department: Payer: Medicaid Other

## 2017-01-23 DIAGNOSIS — I10 Essential (primary) hypertension: Secondary | ICD-10-CM | POA: Insufficient documentation

## 2017-01-23 DIAGNOSIS — N200 Calculus of kidney: Secondary | ICD-10-CM

## 2017-01-23 DIAGNOSIS — Z79899 Other long term (current) drug therapy: Secondary | ICD-10-CM | POA: Diagnosis not present

## 2017-01-23 DIAGNOSIS — R109 Unspecified abdominal pain: Secondary | ICD-10-CM | POA: Diagnosis present

## 2017-01-23 DIAGNOSIS — Z85038 Personal history of other malignant neoplasm of large intestine: Secondary | ICD-10-CM | POA: Insufficient documentation

## 2017-01-23 LAB — COMPREHENSIVE METABOLIC PANEL
ALBUMIN: 4.1 g/dL (ref 3.5–5.0)
ALK PHOS: 73 U/L (ref 38–126)
ALT: 19 U/L (ref 17–63)
ANION GAP: 12 (ref 5–15)
AST: 26 U/L (ref 15–41)
BILIRUBIN TOTAL: 0.7 mg/dL (ref 0.3–1.2)
BUN: 22 mg/dL — AB (ref 6–20)
CALCIUM: 9.2 mg/dL (ref 8.9–10.3)
CO2: 24 mmol/L (ref 22–32)
Chloride: 104 mmol/L (ref 101–111)
Creatinine, Ser: 1.81 mg/dL — ABNORMAL HIGH (ref 0.61–1.24)
GFR calc Af Amer: 49 mL/min — ABNORMAL LOW (ref >60)
GFR, EST NON AFRICAN AMERICAN: 42 mL/min — AB (ref >60)
GLUCOSE: 109 mg/dL — AB (ref 65–99)
POTASSIUM: 4 mmol/L (ref 3.5–5.1)
Sodium: 140 mmol/L (ref 135–145)
TOTAL PROTEIN: 8.2 g/dL — AB (ref 6.5–8.1)

## 2017-01-23 LAB — CBC
HEMATOCRIT: 42.7 % (ref 40.0–52.0)
Hemoglobin: 14.8 g/dL (ref 13.0–18.0)
MCH: 29.4 pg (ref 26.0–34.0)
MCHC: 34.8 g/dL (ref 32.0–36.0)
MCV: 84.6 fL (ref 80.0–100.0)
PLATELETS: 202 10*3/uL (ref 150–440)
RBC: 5.05 MIL/uL (ref 4.40–5.90)
RDW: 15.1 % — AB (ref 11.5–14.5)
WBC: 6.1 10*3/uL (ref 3.8–10.6)

## 2017-01-23 LAB — URINALYSIS, COMPLETE (UACMP) WITH MICROSCOPIC
BILIRUBIN URINE: NEGATIVE
Bacteria, UA: NONE SEEN
GLUCOSE, UA: NEGATIVE mg/dL
Ketones, ur: NEGATIVE mg/dL
NITRITE: NEGATIVE
PH: 5 (ref 5.0–8.0)
Protein, ur: 30 mg/dL — AB
SPECIFIC GRAVITY, URINE: 1.014 (ref 1.005–1.030)
Squamous Epithelial / LPF: NONE SEEN

## 2017-01-23 MED ORDER — ONDANSETRON 4 MG PO TBDP: 4.0000 mg | ORAL_TABLET | Freq: Three times a day (TID) | ORAL | 0 refills | Status: DC | PRN

## 2017-01-23 MED ORDER — SODIUM CHLORIDE 0.9 % IV BOLUS (SEPSIS)
1000.0000 mL | Freq: Once | INTRAVENOUS | Status: AC
  Administered 2017-01-23: 1000 mL via INTRAVENOUS

## 2017-01-23 MED ORDER — ONDANSETRON HCL 4 MG/2ML IJ SOLN
4.0000 mg | Freq: Once | INTRAMUSCULAR | Status: AC
  Administered 2017-01-23: 4 mg via INTRAVENOUS
  Filled 2017-01-23: qty 2

## 2017-01-23 MED ORDER — MORPHINE SULFATE (PF) 4 MG/ML IV SOLN
4.0000 mg | Freq: Once | INTRAVENOUS | Status: AC
  Administered 2017-01-23: 4 mg via INTRAVENOUS
  Filled 2017-01-23: qty 1

## 2017-01-23 MED ORDER — OXYCODONE-ACETAMINOPHEN 5-325 MG PO TABS: 1.0000 | ORAL_TABLET | ORAL | 0 refills | Status: DC | PRN

## 2017-01-23 NOTE — ED Notes (Signed)
Pt alert and oriented X4, active, cooperative, pt in NAD. RR even and unlabored, color WNL.  Pt informed to return if any life threatening symptoms occur.  Discharge and followup instructions reviewed. Left with sister.

## 2017-01-23 NOTE — ED Notes (Signed)
Sister's number, call when ready for discharge [phone]

## 2017-01-23 NOTE — ED Notes (Signed)
Pt sister updated on plan of care with pt permission.

## 2017-01-23 NOTE — ED Notes (Signed)
Pt reports left abdominal pain since Thursday, last "normal" BM on Wednesday, pt reports multiple GI hx to include resection, colon CA, denies V/D/fever chills  Pt appears NAD, legally blind, oriented and alert,
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
## 2017-01-23 NOTE — ED Triage Notes (Signed)
Pt with history of colon cancer. Pt states had a "bowel surgery" three months ago. Pt states he has left sided flank pain and llq pain that began 3-4 days ago. Pt states does have a history of renal calculi. Pt states has had nausea, but no emesis. Pt denies known fever.

## 2017-01-23 NOTE — ED Notes (Signed)
Pt sister contacted to come pick up for discharge.

## 2017-01-26 ENCOUNTER — Encounter: Payer: Self-pay | Admitting: Urology

## 2017-01-26 ENCOUNTER — Ambulatory Visit (INDEPENDENT_AMBULATORY_CARE_PROVIDER_SITE_OTHER): Payer: Medicaid Other | Admitting: Urology

## 2017-01-26 VITALS — BP 148/106 | HR 80 | Ht 78.0 in | Wt 222.9 lb

## 2017-01-26 DIAGNOSIS — N200 Calculus of kidney: Secondary | ICD-10-CM | POA: Diagnosis not present

## 2017-01-26 LAB — URINALYSIS, COMPLETE
BILIRUBIN UA: NEGATIVE
Glucose, UA: NEGATIVE
KETONES UA: NEGATIVE
Nitrite, UA: NEGATIVE
SPEC GRAV UA: 1.02 (ref 1.005–1.030)
UUROB: 0.2 mg/dL (ref 0.2–1.0)
pH, UA: 5 (ref 5.0–7.5)

## 2017-01-26 LAB — MICROSCOPIC EXAMINATION: Epithelial Cells (non renal): NONE SEEN /hpf (ref 0–10)

## 2017-01-26 MED ORDER — ONDANSETRON HCL 4 MG PO TABS: 4.0000 mg | ORAL_TABLET | Freq: Three times a day (TID) | ORAL | 0 refills | Status: DC | PRN

## 2017-01-26 MED ORDER — TAMSULOSIN HCL 0.4 MG PO CAPS: 0.4000 mg | ORAL_CAPSULE | Freq: Every day | ORAL | 0 refills | Status: DC

## 2017-01-26 MED ORDER — OXYCODONE-ACETAMINOPHEN 5-325 MG PO TABS: 1.0000 | ORAL_TABLET | ORAL | 0 refills | Status: DC | PRN
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
## 2017-02-08 ENCOUNTER — Other Ambulatory Visit: Payer: Self-pay | Admitting: Nurse Practitioner

## 2017-02-08 ENCOUNTER — Ambulatory Visit: Payer: Medicaid Other

## 2017-02-08 ENCOUNTER — Telehealth: Payer: Self-pay | Admitting: Internal Medicine

## 2017-02-08 NOTE — Telephone Encounter (Signed)
CT declined by insuarnce. Patient recent creatinine elevated at 1.8; hold CT scan for now. CANCEL CT scan.     We will reevaluate the visit January 16th; and then decide if patient needs to have a CT scan.

## 2017-02-08 NOTE — Telephone Encounter (Signed)
msg sent to scheduling team to cnl ct scan

## 2017-02-09 ENCOUNTER — Ambulatory Visit: Payer: Medicaid Other

## 2017-02-15 ENCOUNTER — Encounter: Payer: Self-pay | Admitting: Urology

## 2017-02-15 ENCOUNTER — Ambulatory Visit: Payer: Medicaid Other | Admitting: Urology

## 2017-02-17 ENCOUNTER — Ambulatory Visit: Payer: Medicaid Other

## 2017-02-22 ENCOUNTER — Other Ambulatory Visit: Payer: Medicaid Other

## 2017-02-22 ENCOUNTER — Ambulatory Visit: Payer: Medicaid Other | Admitting: Internal Medicine

## 2017-03-08 ENCOUNTER — Inpatient Hospital Stay: Payer: Medicaid Other

## 2017-03-08 ENCOUNTER — Inpatient Hospital Stay: Payer: Medicaid Other | Attending: Internal Medicine | Admitting: Internal Medicine

## 2017-03-08 ENCOUNTER — Encounter: Payer: Self-pay | Admitting: Internal Medicine

## 2017-03-08 ENCOUNTER — Other Ambulatory Visit: Payer: Self-pay

## 2017-03-08 VITALS — BP 155/106 | HR 59 | Temp 97.6°F | Resp 18

## 2017-03-08 DIAGNOSIS — Z933 Colostomy status: Secondary | ICD-10-CM | POA: Insufficient documentation

## 2017-03-08 DIAGNOSIS — I10 Essential (primary) hypertension: Secondary | ICD-10-CM | POA: Insufficient documentation

## 2017-03-08 DIAGNOSIS — Z85048 Personal history of other malignant neoplasm of rectum, rectosigmoid junction, and anus: Secondary | ICD-10-CM | POA: Insufficient documentation

## 2017-03-08 DIAGNOSIS — G629 Polyneuropathy, unspecified: Secondary | ICD-10-CM | POA: Insufficient documentation

## 2017-03-08 DIAGNOSIS — Z9221 Personal history of antineoplastic chemotherapy: Secondary | ICD-10-CM | POA: Diagnosis not present

## 2017-03-08 DIAGNOSIS — Z800 Family history of malignant neoplasm of digestive organs: Secondary | ICD-10-CM

## 2017-03-08 DIAGNOSIS — E785 Hyperlipidemia, unspecified: Secondary | ICD-10-CM | POA: Diagnosis not present

## 2017-03-08 DIAGNOSIS — C20 Malignant neoplasm of rectum: Secondary | ICD-10-CM

## 2017-03-08 DIAGNOSIS — D649 Anemia, unspecified: Secondary | ICD-10-CM | POA: Insufficient documentation

## 2017-03-08 DIAGNOSIS — Z79899 Other long term (current) drug therapy: Secondary | ICD-10-CM | POA: Insufficient documentation

## 2017-03-08 DIAGNOSIS — Z923 Personal history of irradiation: Secondary | ICD-10-CM | POA: Diagnosis not present

## 2017-03-08 DIAGNOSIS — Z95828 Presence of other vascular implants and grafts: Secondary | ICD-10-CM

## 2017-03-08 LAB — COMPREHENSIVE METABOLIC PANEL
ALBUMIN: 4 g/dL (ref 3.5–5.0)
ALT: 21 U/L (ref 17–63)
ANION GAP: 10 (ref 5–15)
AST: 22 U/L (ref 15–41)
Alkaline Phosphatase: 67 U/L (ref 38–126)
BUN: 9 mg/dL (ref 6–20)
CALCIUM: 9 mg/dL (ref 8.9–10.3)
CHLORIDE: 103 mmol/L (ref 101–111)
CO2: 24 mmol/L (ref 22–32)
Creatinine, Ser: 0.99 mg/dL (ref 0.61–1.24)
GFR calc Af Amer: >60 mL/min (ref >60)
GFR calc non Af Amer: >60 mL/min (ref >60)
GLUCOSE: 105 mg/dL — AB (ref 65–99)
Potassium: 3.5 mmol/L (ref 3.5–5.1)
SODIUM: 137 mmol/L (ref 135–145)
Total Bilirubin: 0.7 mg/dL (ref 0.3–1.2)
Total Protein: 8 g/dL (ref 6.5–8.1)

## 2017-03-08 LAB — CBC WITH DIFFERENTIAL/PLATELET
BASOS PCT: 1 %
Basophils Absolute: 0 10*3/uL (ref 0–0.1)
Eosinophils Absolute: 0.3 10*3/uL (ref 0–0.7)
Eosinophils Relative: 7 %
HEMATOCRIT: 43 % (ref 40.0–52.0)
HEMOGLOBIN: 14.4 g/dL (ref 13.0–18.0)
LYMPHS ABS: 1.3 10*3/uL (ref 1.0–3.6)
LYMPHS PCT: 28 %
MCH: 28.5 pg (ref 26.0–34.0)
MCHC: 33.4 g/dL (ref 32.0–36.0)
MCV: 85.3 fL (ref 80.0–100.0)
MONO ABS: 0.4 10*3/uL (ref 0.2–1.0)
MONOS PCT: 8 %
NEUTROS ABS: 2.5 10*3/uL (ref 1.4–6.5)
Neutrophils Relative %: 56 %
Platelets: 232 10*3/uL (ref 150–440)
RBC: 5.05 MIL/uL (ref 4.40–5.90)
RDW: 15.6 % — AB (ref 11.5–14.5)
WBC: 4.5 10*3/uL (ref 3.8–10.6)

## 2017-03-08 MED ORDER — HEPARIN SOD (PORK) LOCK FLUSH 100 UNIT/ML IV SOLN
500.0000 [IU] | INTRAVENOUS | Status: AC | PRN
  Administered 2017-03-08: 500 [IU]

## 2017-03-08 MED ORDER — SODIUM CHLORIDE 0.9% FLUSH
10.0000 mL | INTRAVENOUS | Status: AC | PRN
  Administered 2017-03-08: 10 mL
  Filled 2017-03-08: qty 10

## 2017-03-08 MED ORDER — AMLODIPINE BESYLATE 5 MG PO TABS: 5.0000 mg | ORAL_TABLET | ORAL | 4 refills | Status: DC

## 2017-03-08 NOTE — Patient Instructions (Signed)

## 2017-03-08 NOTE — Progress Notes (Signed)
Pt here for f/u for rectal cancer. Patient reports "no improvement in neuropathy in feet with the use of gabapentin 300 mg 1 tablet 3 times daily.  BP today is 156/112 in right arm and 155/106 in left arm. Patient reports that he is only using Norvasc for bp. "I only take it once a week because the medications decrease my man drive." I asked the patient to clarify and he reports decrease in libido with BP medication. Pt states that he is not using the metoprolol. He "doesn't know anything about this medication." I asked the patient if he has discussed his BP concerns and decreased libido with his pcp. Patient states that he has not.   Pt educated on the importance of taking bp medications. I advised pt to contact pcp.

## 2017-03-08 NOTE — Assessment & Plan Note (Addendum)
s/p on neoadjuvant radiation chemotherapy- [id number]-stage III rectal cancer; negative margins. s/p adjuvant FOLFOX.   # Clinically no evidence of recurrence. CT scan [done in May 2018]; CT kidney protocol dec 2018- did not show any evidence of recurrence. CEA pending from today.   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  # Elevated Blood pressure/tachycardia- repeat 141/103; pulse-116 ? Compliance sec to ED; recommend taking norvasc atleast.  Prescription sent to pharmacy.  # port malfunction- ? Explantation if CT neg at next visit.   # follow up in 3 months/port flush/labs; CT scan a/p few days prior.
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
## 2017-03-09 LAB — CEA: CEA: 0.9 ng/mL (ref 0.0–4.7)

## 2017-03-20 ENCOUNTER — Inpatient Hospital Stay (HOSPITAL_COMMUNITY)
Admission: AD | Admit: 2017-03-20 | Discharge: 2017-03-24 | DRG: 064 | Disposition: A | Discharge status: Rehabilitation Facility | Payer: Medicaid Other | Source: Other Acute Inpatient Hospital | Attending: Neurology | Admitting: Neurology

## 2017-03-20 ENCOUNTER — Other Ambulatory Visit: Payer: Self-pay

## 2017-03-20 ENCOUNTER — Inpatient Hospital Stay (HOSPITAL_COMMUNITY): Payer: Medicaid Other

## 2017-03-20 ENCOUNTER — Emergency Department: Payer: Medicaid Other

## 2017-03-20 ENCOUNTER — Encounter: Payer: Self-pay | Admitting: Emergency Medicine

## 2017-03-20 ENCOUNTER — Emergency Department
Admission: EM | Admit: 2017-03-20 | Discharge: 2017-03-20 | Disposition: A | Discharge status: Other Acute Inpatient Hospital | Payer: Medicaid Other | Attending: Emergency Medicine | Admitting: Emergency Medicine

## 2017-03-20 DIAGNOSIS — G441 Vascular headache, not elsewhere classified: Secondary | ICD-10-CM | POA: Diagnosis not present

## 2017-03-20 DIAGNOSIS — Z6830 Body mass index (BMI) 30.0-30.9, adult: Secondary | ICD-10-CM | POA: Diagnosis not present

## 2017-03-20 DIAGNOSIS — G936 Cerebral edema: Secondary | ICD-10-CM | POA: Diagnosis present

## 2017-03-20 DIAGNOSIS — I10 Essential (primary) hypertension: Secondary | ICD-10-CM | POA: Diagnosis present

## 2017-03-20 DIAGNOSIS — E669 Obesity, unspecified: Secondary | ICD-10-CM | POA: Diagnosis present

## 2017-03-20 DIAGNOSIS — R4701 Aphasia: Secondary | ICD-10-CM | POA: Diagnosis present

## 2017-03-20 DIAGNOSIS — Z933 Colostomy status: Secondary | ICD-10-CM | POA: Diagnosis not present

## 2017-03-20 DIAGNOSIS — Z85048 Personal history of other malignant neoplasm of rectum, rectosigmoid junction, and anus: Secondary | ICD-10-CM | POA: Diagnosis not present

## 2017-03-20 DIAGNOSIS — G8194 Hemiplegia, unspecified affecting left nondominant side: Secondary | ICD-10-CM | POA: Diagnosis present

## 2017-03-20 DIAGNOSIS — K5900 Constipation, unspecified: Secondary | ICD-10-CM | POA: Diagnosis present

## 2017-03-20 DIAGNOSIS — I639 Cerebral infarction, unspecified: Secondary | ICD-10-CM | POA: Diagnosis not present

## 2017-03-20 DIAGNOSIS — E785 Hyperlipidemia, unspecified: Secondary | ICD-10-CM | POA: Diagnosis present

## 2017-03-20 DIAGNOSIS — Z85038 Personal history of other malignant neoplasm of large intestine: Secondary | ICD-10-CM | POA: Diagnosis not present

## 2017-03-20 DIAGNOSIS — Z800 Family history of malignant neoplasm of digestive organs: Secondary | ICD-10-CM

## 2017-03-20 DIAGNOSIS — E876 Hypokalemia: Secondary | ICD-10-CM | POA: Diagnosis present

## 2017-03-20 DIAGNOSIS — Z79899 Other long term (current) drug therapy: Secondary | ICD-10-CM | POA: Insufficient documentation

## 2017-03-20 DIAGNOSIS — R4182 Altered mental status, unspecified: Secondary | ICD-10-CM | POA: Diagnosis present

## 2017-03-20 DIAGNOSIS — H548 Legal blindness, as defined in USA: Secondary | ICD-10-CM | POA: Diagnosis present

## 2017-03-20 DIAGNOSIS — I63511 Cerebral infarction due to unspecified occlusion or stenosis of right middle cerebral artery: Secondary | ICD-10-CM

## 2017-03-20 DIAGNOSIS — R001 Bradycardia, unspecified: Secondary | ICD-10-CM | POA: Diagnosis not present

## 2017-03-20 DIAGNOSIS — H543 Unqualified visual loss, both eyes: Secondary | ICD-10-CM | POA: Diagnosis not present

## 2017-03-20 DIAGNOSIS — R7989 Other specified abnormal findings of blood chemistry: Secondary | ICD-10-CM | POA: Diagnosis not present

## 2017-03-20 DIAGNOSIS — R1310 Dysphagia, unspecified: Secondary | ICD-10-CM | POA: Diagnosis present

## 2017-03-20 DIAGNOSIS — Z902 Acquired absence of lung [part of]: Secondary | ICD-10-CM | POA: Diagnosis not present

## 2017-03-20 DIAGNOSIS — I69354 Hemiplegia and hemiparesis following cerebral infarction affecting left non-dominant side: Secondary | ICD-10-CM | POA: Diagnosis not present

## 2017-03-20 DIAGNOSIS — G629 Polyneuropathy, unspecified: Secondary | ICD-10-CM | POA: Diagnosis present

## 2017-03-20 DIAGNOSIS — Z9221 Personal history of antineoplastic chemotherapy: Secondary | ICD-10-CM | POA: Diagnosis not present

## 2017-03-20 DIAGNOSIS — I6389 Other cerebral infarction: Secondary | ICD-10-CM | POA: Diagnosis not present

## 2017-03-20 DIAGNOSIS — R0989 Other specified symptoms and signs involving the circulatory and respiratory systems: Secondary | ICD-10-CM | POA: Diagnosis not present

## 2017-03-20 DIAGNOSIS — R29810 Facial weakness: Secondary | ICD-10-CM | POA: Diagnosis present

## 2017-03-20 DIAGNOSIS — K909 Intestinal malabsorption, unspecified: Secondary | ICD-10-CM | POA: Diagnosis not present

## 2017-03-20 DIAGNOSIS — R197 Diarrhea, unspecified: Secondary | ICD-10-CM | POA: Diagnosis not present

## 2017-03-20 DIAGNOSIS — H547 Unspecified visual loss: Secondary | ICD-10-CM | POA: Insufficient documentation

## 2017-03-20 DIAGNOSIS — I63411 Cerebral infarction due to embolism of right middle cerebral artery: Secondary | ICD-10-CM | POA: Diagnosis present

## 2017-03-20 DIAGNOSIS — C189 Malignant neoplasm of colon, unspecified: Secondary | ICD-10-CM | POA: Diagnosis present

## 2017-03-20 DIAGNOSIS — M792 Neuralgia and neuritis, unspecified: Secondary | ICD-10-CM | POA: Diagnosis not present

## 2017-03-20 DIAGNOSIS — R195 Other fecal abnormalities: Secondary | ICD-10-CM | POA: Diagnosis not present

## 2017-03-20 LAB — CBC WITH DIFFERENTIAL/PLATELET
Basophils Absolute: 0.1 10*3/uL (ref 0–0.1)
Basophils Relative: 1 %
Eosinophils Absolute: 0.1 10*3/uL (ref 0–0.7)
Eosinophils Relative: 2 %
HCT: 43 % (ref 40.0–52.0)
Hemoglobin: 14.6 g/dL (ref 13.0–18.0)
Lymphocytes Relative: 18 %
Lymphs Abs: 1.2 10*3/uL (ref 1.0–3.6)
MCH: 28.9 pg (ref 26.0–34.0)
MCHC: 33.9 g/dL (ref 32.0–36.0)
MCV: 85.4 fL (ref 80.0–100.0)
Monocytes Absolute: 0.5 10*3/uL (ref 0.2–1.0)
Monocytes Relative: 7 %
Neutro Abs: 5.2 10*3/uL (ref 1.4–6.5)
Neutrophils Relative %: 72 %
Platelets: 190 10*3/uL (ref 150–440)
RBC: 5.04 MIL/uL (ref 4.40–5.90)
RDW: 15.8 % — ABNORMAL HIGH (ref 11.5–14.5)
WBC: 7.1 10*3/uL (ref 3.8–10.6)

## 2017-03-20 LAB — COMPREHENSIVE METABOLIC PANEL
ALT: 29 U/L (ref 17–63)
ANION GAP: 11 (ref 5–15)
AST: 26 U/L (ref 15–41)
Albumin: 4.1 g/dL (ref 3.5–5.0)
Alkaline Phosphatase: 65 U/L (ref 38–126)
BUN: 13 mg/dL (ref 6–20)
CHLORIDE: 102 mmol/L (ref 101–111)
CO2: 24 mmol/L (ref 22–32)
Calcium: 9.1 mg/dL (ref 8.9–10.3)
Creatinine, Ser: 0.95 mg/dL (ref 0.61–1.24)
GFR calc Af Amer: >60 mL/min (ref >60)
Glucose, Bld: 100 mg/dL — ABNORMAL HIGH (ref 65–99)
POTASSIUM: 3.4 mmol/L — AB (ref 3.5–5.1)
SODIUM: 137 mmol/L (ref 135–145)
Total Bilirubin: 0.9 mg/dL (ref 0.3–1.2)
Total Protein: 8.1 g/dL (ref 6.5–8.1)

## 2017-03-20 LAB — ETHANOL

## 2017-03-20 LAB — APTT: aPTT: 24 seconds (ref 24–36)

## 2017-03-20 LAB — LIPASE, BLOOD: LIPASE: 19 U/L (ref 11–51)

## 2017-03-20 LAB — SALICYLATE LEVEL

## 2017-03-20 LAB — TROPONIN I: Troponin I: <0.03 ng/mL (ref <0.03)

## 2017-03-20 LAB — LACTIC ACID, PLASMA: Lactic Acid, Venous: 1.7 mmol/L (ref 0.5–1.9)

## 2017-03-20 LAB — ACETAMINOPHEN LEVEL: Acetaminophen (Tylenol), Serum: <10 ug/mL — ABNORMAL LOW (ref 10–30)

## 2017-03-20 LAB — PROTIME-INR
INR: 0.93
PROTHROMBIN TIME: 12.4 s (ref 11.4–15.2)

## 2017-03-20 LAB — GLUCOSE, CAPILLARY: Glucose-Capillary: 83 mg/dL (ref 65–99)

## 2017-03-20 LAB — AMMONIA: Ammonia: 19 umol/L (ref 9–35)

## 2017-03-20 MED ORDER — IOPAMIDOL (ISOVUE-370) INJECTION 76%
100.0000 mL | Freq: Once | INTRAVENOUS | Status: AC | PRN
  Administered 2017-03-20: 100 mL via INTRAVENOUS

## 2017-03-20 MED ORDER — ENOXAPARIN SODIUM 40 MG/0.4ML ~~LOC~~ SOLN
40.0000 mg | SUBCUTANEOUS | Status: DC
  Administered 2017-03-21 – 2017-03-24 (×4): 40 mg via SUBCUTANEOUS
  Filled 2017-03-20 (×4): qty 0.4

## 2017-03-20 MED ORDER — SODIUM CHLORIDE 0.9 % IV SOLN
100.0000 mL/h | INTRAVENOUS | Status: DC
  Administered 2017-03-20: 100 mL/h via INTRAVENOUS

## 2017-03-20 MED ORDER — SODIUM CHLORIDE 0.9 % IV SOLN
INTRAVENOUS | Status: DC
  Administered 2017-03-21 – 2017-03-23 (×4): via INTRAVENOUS

## 2017-03-20 MED ORDER — SODIUM CHLORIDE 0.9 % IV BOLUS (SEPSIS)
500.0000 mL | Freq: Once | INTRAVENOUS | Status: AC
  Administered 2017-03-20: 500 mL via INTRAVENOUS

## 2017-03-20 MED ORDER — IOPAMIDOL (ISOVUE-370) INJECTION 76%: 50.0000 mL | Freq: Once | INTRAVENOUS | Status: DC | PRN

## 2017-03-20 MED ORDER — STROKE: EARLY STAGES OF RECOVERY BOOK
Freq: Once | Status: AC
  Administered 2017-03-21: 1
  Filled 2017-03-20: qty 1

## 2017-03-20 NOTE — ED Notes (Signed)
Patient motions to left arm and mumbles incoherently. Asked patient if his arm hurt, patient responds with words sounding like  "weak." Patients grip strengths assessed and left is weaker than the right.

## 2017-03-20 NOTE — ED Notes (Signed)
Pt non responsive to commands, unable to speak clearly.

## 2017-03-20 NOTE — ED Notes (Signed)
Family at bedside. Family clarifies that patient called his family member this morning around 1000 and he "seemed normal." Family states this has never happened before.

## 2017-03-20 NOTE — ED Notes (Signed)
[facility] to transport to [facility] ED  2115

## 2017-03-20 NOTE — Consult Note (Signed)
[facility] TeleNeurology Consult Services  Patient was informed the Neurology Consult would happen via telehealth (remote video) and consented to receiving care in this manner.  Impression: Stroke - patient presents with aphasia and right side weakness. Presentations suggestive of acute stroke, large vessel etiology. LSN > 4.5 hours thus outside the therapeutic window for IV tPA. Will ger CTA/CTP to evaluate for LVO.  Not a tpa candidate due to: outside therapeutic window  Not an NIR candidate due to: right M1 occlusion on CTA, this has been discussed with NIR.  Differential Diagnosis:  Comments:  [facility] contacted: 19:04  [facility] at bedside: 19:09  NIHSS assessment time: 19:12  Recommendations: CTA head and neck/CTP STAT, if LVO transfer for NIR evaluation If CTA negative for LVO, admit for further work-up Further inpatient evaluation as per Neurology/ Internal Medicine Discussed with ED MD  History of Present Illness  Patient is a 52 years old man who presents to the ED after family found him with right side weakness and aphasic at 4:00pm today. Last time someone talked to him and he was fine without complaints or speech difficulty. was at 10:00am. Symptoms have remained constant since arrival to the ED. PMHx of HTN and colon CA on remission.  Exam  NIHSS score: 14 1A: Level of Consciousness - Requires repeated stimulation to arouse 1B: Ask Month and Age - 0 Questions Right 1C: 'Blink Eyes' & 'Squeeze Hands' - Performs 0 Tasks 2: Test Horizontal Extraocular Movements - Normal 3: Test Visual Fields - No Visual Loss 4: Test Facial Palsy - Minor paralysis (flat nasolabial fold, smile asymetry) 5A: Test Left Arm Motor Drift - No Drift for 10 Seconds 5B: Test Right Arm Motor Drift - Drift, hits bed 6A: Test Left Leg Motor Drift - No Drift for 5 Seconds 6B: Test Right Leg Motor Drift - Drift, hits bed 7: Test Limb Ataxia - Does Not Understand 8: Test  Sensation - Mild-Moderate Loss: Less Sharp/More Dull 9: Test Language/Aphasia- Severe Aphasia: Fragmentary Expression, Inference Needed, Cannot Identify Materials 10: Test Dysarthria - Intubated/Unable to Test 11: Test Extinction/Inattention - No abnormality  Noncontrast CTH:  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 Hyperdense vessels off the distal right MCA compatible with thrombosed vessels.  CTA: distal right M1 occlusion  Medical Decision Making:  - Extensive number of diagnosis or management options are considered above.  - Extensive amount of complex data reviewed.  - High risk of complication and/or morbidity or mortality are associated with differential diagnostic considerations above.  - There may be Uncertain outcome and increased probability of prolonged functional impairment or high probability of severe prolonged functional impairment associated with some of these differential diagnosis.  Medical Data Reviewed:  1.Data reviewed include clinical labs, radiology, Medical Tests;  2.Tests results discussed w/performing or interpreting physician;  3.Obtaining/reviewing old medical records;  4.Obtaining case history from another source;  5.Independent review of image, tracing or specimen.

## 2017-03-20 NOTE — ED Notes (Signed)
Report given to EMS; Pt belongings sent with family (medications and sunglasses). Pt unable to sign for transfer, mother signed for him.

## 2017-03-20 NOTE — ED Notes (Signed)
CODE STROKE  CALLED TO [phone]  PER  DR  [facility] MD

## 2017-03-20 NOTE — Progress Notes (Signed)
   03/20/17 1900  Clinical Encounter Type  Visited With Family  Visit Type Initial;Spiritual support  Referral From Nurse  Consult/Referral To Chaplain  Spiritual Encounters  Spiritual Needs Prayer  Stress Factors  Family Stress Factors Health changes   Chaplain responded to code stroke.  Two of pts uncles were in the room, along with his mother.  1 of family identifies as Baptist, and pts mother identifies as Restaurant manager, fast food.  Chaplain provided prayer for family.    Rev. [place]

## 2017-03-20 NOTE — ED Notes (Signed)
Medical Necessity and EMTALA documentation reviewed at this time and noted to be complete per policy. 

## 2017-03-20 NOTE — ED Provider Notes (Addendum)
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 aphasic and far from baseline.    [CF]  1857 NIH Stroke Scale  Interval: Baseline Time: 18:55 PM Person Administering Scale: Hinda Kehr  1a  Level of consciousness: 2=not alert, requires repeated stimulation to attend, or is obtunded and requires strong or painful stimulation to make movements (not stereotyped) 1b. LOC questions:  1=Performs one task correctly 1c. LOC commands: 2=Performs neither task correctly 2.  Best Gaze: 2=forced deviation, or total gaze paresis not overcome by oculocephalic maneuver 3.  Visual: 0=No visual loss (chronic blindness) 4. Facial Palsy: 0=Normal symmetric movement 5a.  Motor left arm: 2=Some effort against gravity, limb cannot get to or maintain (if cured) 90 (or 45) degrees, drifts down to bed, but has some effort against gravity 5b.  Motor right arm: 2=Some effort against gravity, limb cannot get to or maintain (if cured) 90 (or 45) degrees, drifts down to bed, but has some effort against gravity 6a. motor left leg: 2=Some effort against gravity, limb cannot get to or maintain (if cured) 90  (or 45) degrees, drifts down to bed, but has some effort against gravity 6b  Motor right leg:  2=Some effort against gravity, limb cannot get to or maintain (if cured) 90 (or 45) degrees, drifts down to bed, but has some effort against gravity 7. Limb Ataxia: 0=Absent 8.  Sensory: 0=Normal; no sensory loss 9. Best Language:  2 10. Dysarthria: 1=Mild to moderate, patient slurs at least some words and at worst, can be understood with some difficulty 11. Extinction and Inattention: 0=No abnormality 12. Distal motor function: 2=No voluntary extension after 5 seconds. Movement of the fingers at another time are not scored  Total:   20    [CF]  1903 Teleneuro pending  [CF]  1909 I advised the tele-neurology RN about the situation and about the last known normal which was approximately 8.5 hours ago.  Given the large right MCA distribution and time of onset, very important to get neurology recommendations both for additional imaging as well as for treatment.  She did not critically ill but hemodynamically stable and protecting his airway at this time  [CF]  1927 Teleneurology has ordered angio and perfusion studies  [CF]  2016 Dr. Collins [place] with radiology called, patient has an occlusion that may be amenable to thrombectomy.  I am awaiting word Back from tele-neurology  [CF]  [facility] with teleneurology informed me that the patient has been accepted to [facility] by Dr. Leonel Ramsay to be transported to the emergency department.  Transportation is being arranged as an emergency traffic.  Patient remains hemodynamically stable.  [CF]    Clinical Course User Index [CF]  Hinda Kehr, MD    ____________________________________________  FINAL CLINICAL IMPRESSION(S) / ED DIAGNOSES  Final diagnoses:  Acute ischemic right MCA stroke [facility])     MEDICATIONS GIVEN DURING THIS VISIT:  Medications  sodium chloride 0.9 % bolus 500 mL (0 mLs Intravenous Stopped 03/20/17 2133)    Followed by  0.9 %  sodium  chloride infusion (0 mL/hr Intravenous Stopped 03/20/17 2133)  iopamidol (ISOVUE-370) 76 % injection 100 mL (100 mLs Intravenous Contrast Given 03/20/17 1956)     ED Discharge Orders    None       Note:  This document was prepared using Dragon voice recognition software and may include unintentional dictation errors.    Hinda Kehr, MD 03/20/17 2128    Hinda Kehr, MD 03/20/17 2137

## 2017-03-20 NOTE — ED Triage Notes (Signed)
Pt arrived via ems from home after family called ems with complaints of pt being unresponsive. Pt was last talked to by family at 2100 last night. EMS report; family called patient who would not respond to questions and they became worried. Pt is reponsive to pain upon assessment. Vitals WDL. Pt mumbled incomprehensible words when asked questions. Family not at bedside currently.

## 2017-03-21 ENCOUNTER — Inpatient Hospital Stay (HOSPITAL_COMMUNITY): Payer: Medicaid Other

## 2017-03-21 DIAGNOSIS — Z85038 Personal history of other malignant neoplasm of large intestine: Secondary | ICD-10-CM

## 2017-03-21 DIAGNOSIS — E785 Hyperlipidemia, unspecified: Secondary | ICD-10-CM

## 2017-03-21 DIAGNOSIS — I63411 Cerebral infarction due to embolism of right middle cerebral artery: Principal | ICD-10-CM

## 2017-03-21 DIAGNOSIS — H548 Legal blindness, as defined in USA: Secondary | ICD-10-CM

## 2017-03-21 DIAGNOSIS — I639 Cerebral infarction, unspecified: Secondary | ICD-10-CM

## 2017-03-21 LAB — LIPID PANEL
CHOL/HDL RATIO: 6.6 ratio
Cholesterol: 263 mg/dL — ABNORMAL HIGH (ref 0–200)
HDL: 40 mg/dL — ABNORMAL LOW (ref >40)
LDL CALC: 191 mg/dL — AB (ref 0–99)
Triglycerides: 160 mg/dL — ABNORMAL HIGH (ref <150)
VLDL: 32 mg/dL (ref 0–40)

## 2017-03-21 LAB — CBC
HCT: 40.6 % (ref 39.0–52.0)
Hemoglobin: 13.8 g/dL (ref 13.0–17.0)
MCH: 29.1 pg (ref 26.0–34.0)
MCHC: 34 g/dL (ref 30.0–36.0)
MCV: 85.7 fL (ref 78.0–100.0)
PLATELETS: 183 10*3/uL (ref 150–400)
RBC: 4.74 MIL/uL (ref 4.22–5.81)
RDW: 14.8 % (ref 11.5–15.5)
WBC: 7.3 10*3/uL (ref 4.0–10.5)

## 2017-03-21 LAB — BASIC METABOLIC PANEL
Anion gap: 10 (ref 5–15)
BUN: 8 mg/dL (ref 6–20)
CHLORIDE: 105 mmol/L (ref 101–111)
CO2: 24 mmol/L (ref 22–32)
CREATININE: 1.01 mg/dL (ref 0.61–1.24)
Calcium: 8.6 mg/dL — ABNORMAL LOW (ref 8.9–10.3)
GFR calc Af Amer: >60 mL/min (ref >60)
GFR calc non Af Amer: >60 mL/min (ref >60)
Glucose, Bld: 118 mg/dL — ABNORMAL HIGH (ref 65–99)
Potassium: 3.4 mmol/L — ABNORMAL LOW (ref 3.5–5.1)
Sodium: 139 mmol/L (ref 135–145)

## 2017-03-21 LAB — MRSA PCR SCREENING: MRSA by PCR: NEGATIVE

## 2017-03-21 LAB — CREATININE, SERUM
CREATININE: 1.02 mg/dL (ref 0.61–1.24)
GFR calc Af Amer: >60 mL/min (ref >60)
GFR calc non Af Amer: >60 mL/min (ref >60)

## 2017-03-21 LAB — HEMOGLOBIN A1C
Hgb A1c MFr Bld: 6 % — ABNORMAL HIGH (ref 4.8–5.6)
MEAN PLASMA GLUCOSE: 125.5 mg/dL

## 2017-03-21 MED ORDER — BUTALBITAL-APAP-CAFFEINE 50-325-40 MG PO TABS
1.0000 | ORAL_TABLET | Freq: Three times a day (TID) | ORAL | Status: DC | PRN
  Administered 2017-03-21 – 2017-03-23 (×3): 1 via ORAL
  Filled 2017-03-21 (×3): qty 1

## 2017-03-21 MED ORDER — GABAPENTIN 300 MG PO CAPS
300.0000 mg | ORAL_CAPSULE | Freq: Three times a day (TID) | ORAL | Status: DC
  Administered 2017-03-21 – 2017-03-24 (×9): 300 mg via ORAL
  Filled 2017-03-21 (×9): qty 1

## 2017-03-21 MED ORDER — METOPROLOL TARTRATE 12.5 MG HALF TABLET
12.5000 mg | ORAL_TABLET | Freq: Two times a day (BID) | ORAL | Status: DC
  Administered 2017-03-21 – 2017-03-24 (×6): 12.5 mg via ORAL
  Filled 2017-03-21 (×6): qty 1

## 2017-03-21 MED ORDER — ASPIRIN 300 MG RE SUPP
300.0000 mg | Freq: Every day | RECTAL | Status: DC
  Administered 2017-03-21: 300 mg via RECTAL
  Filled 2017-03-21: qty 1

## 2017-03-21 MED ORDER — POTASSIUM CHLORIDE CRYS ER 20 MEQ PO TBCR
20.0000 meq | EXTENDED_RELEASE_TABLET | Freq: Two times a day (BID) | ORAL | Status: DC
  Administered 2017-03-21 – 2017-03-22 (×3): 20 meq via ORAL
  Filled 2017-03-21 (×3): qty 1

## 2017-03-21 MED ORDER — ASPIRIN EC 325 MG PO TBEC
325.0000 mg | DELAYED_RELEASE_TABLET | Freq: Every day | ORAL | Status: DC
  Administered 2017-03-22 – 2017-03-24 (×3): 325 mg via ORAL
  Filled 2017-03-21 (×3): qty 1

## 2017-03-21 MED ORDER — ATORVASTATIN CALCIUM 80 MG PO TABS
80.0000 mg | ORAL_TABLET | Freq: Every day | ORAL | Status: DC
  Administered 2017-03-21 – 2017-03-24 (×4): 80 mg via ORAL
  Filled 2017-03-21 (×4): qty 1

## 2017-03-21 MED ORDER — METOPROLOL TARTRATE 25 MG PO TABS: 25.0000 mg | ORAL_TABLET | Freq: Two times a day (BID) | ORAL | Status: DC

## 2017-03-21 MED ORDER — ORAL CARE MOUTH RINSE: 15.0000 mL | Freq: Two times a day (BID) | OROMUCOSAL | Status: DC
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
## 2017-03-21 NOTE — Plan of Care (Signed)
Pt able to communicate some needs w/ 1-2 words. Pt able to participate in bath with cues from OT and RN.

## 2017-03-21 NOTE — Progress Notes (Signed)
Rehab Admissions Coordinator Note:  Patient was screened by Cleatrice Burke for appropriateness for an Inpatient Acute Rehab Consult per PT and SLP recommendations.  At this time, we are recommending Inpatient Rehab consult.  Cleatrice Burke 03/21/2017, 12:36 PM  I can be reached at [phone].

## 2017-03-21 NOTE — Evaluation (Signed)
Occupational Therapy Evaluation Patient Details Name: [name] [name] MRN: [id number] DOB: [birth year] Today's Date: 03/21/2017    History of Present Illness 52 y.o. male with a history of blindness who presents with difficulty with speech.  In ED reported right weakness.  CT revealed RIGHT MCA territory infarct with slight left weakness. PMHx: HTN, HLD, colon CA, colostomy   Clinical Impression   PTA, pt was living alone and was independent with ADLs. Pt currently performing UB ADLs with Mod, LB ADLs with Max A, and functional transfers with Min A +2. Pt presenting with decreased cognition as seen by poor initiation, attention, and following of commands requiring increased time and cues throughout ADLs. Pt demonstrating decreased functional use of LUE (domiannt hand) as seen by decreased coordination and grasp strength. Pt will required further acute OT to facilitate safe dc. Recommend dc to CIR for intensive OT to optimize safety, independence with ADLs, and return to PLOF.     Follow Up Recommendations  CIR    Equipment Recommendations  Other (comment)(Defer to next venue)    Recommendations for Other Services Rehab consult;PT consult;Speech consult     Precautions / Restrictions Precautions Precautions: Fall Precaution Comments: blind, aphasia (receptive and expressive) Restrictions Weight Bearing Restrictions: No      Mobility Bed Mobility Overal bed mobility: Needs Assistance Bed Mobility: Sit to Supine;Rolling;Sidelying to Sit     Supine to sit: Min assist Sit to supine: Min assist   General bed mobility comments: Min A for intitation of bed mobility. Tactile cues to follow commands  Transfers Overall transfer level: Needs assistance   Transfers: Sit to/from Stand Sit to Stand: Min assist;From elevated surface;+2 physical assistance         General transfer comment: Min A +2 for power up into standing. Blocking L knee. Once standing, pt maintaining standing with  Min A for balance and stability.     Balance Overall balance assessment: Needs assistance Sitting-balance support: No upper extremity supported;Feet supported Sitting balance-Leahy Scale: Good     Standing balance support: During functional activity;Bilateral upper extremity supported Standing balance-Leahy Scale: Poor Standing balance comment: Reliant on physical A                           ADL either performed or assessed with clinical judgement   ADL Overall ADL's : Needs assistance/impaired Eating/Feeding: Moderate assistance;Sitting   Grooming: Wash/dry face;Sitting;Min guard;Cueing for sequencing Grooming Details (indicate cue type and reason): Min Guard for sitting balance while washing his face. Max cues to perform task and attend. Anticipate requiring increased assistance with bilateral grooming tasks. Upper Body Bathing: Moderate assistance;Sitting Upper Body Bathing Details (indicate cue type and reason): Mod A to initiate UB bathing and Max cues to perform tasks. Pt demonstrating poor grasp and coorindation of LUE during bathing.  Lower Body Bathing: Maximal assistance;Sit to/from stand Lower Body Bathing Details (indicate cue type and reason): Pt able to wash tops of legs. Required max A for peri care, bottom, and lower legs. Pt with porr attention adn following commands. Pt requiring Min A for standing balance  Upper Body Dressing : Moderate assistance;Sitting Upper Body Dressing Details (indicate cue type and reason): Mod A and Max cues for donning new gown Lower Body Dressing: Maximal assistance;Sit to/from stand Lower Body Dressing Details (indicate cue type and reason): max A to don socks while seated     Toileting- Clothing Manipulation and Hygiene: Maximal assistance;Sit to/from stand;+2 for physical assistance  Functional mobility during ADLs: Minimal assistance;+2 for physical assistance;Cueing for sequencing General ADL Comments: Pt with poor  cognition, funcitonal use of LUE (dominant hand), and decreased balance. Pt requiring max cues and increased time throughout session and decreased following of commands.      Vision Baseline Vision/History: Legally blind Patient Visual Report: Other (comment)(Blind at baseline)       Perception     Praxis      Pertinent Vitals/Pain Pain Assessment: Faces Faces Pain Scale: Hurts little more Pain Location: head neck Pain Intervention(s): Monitored during session;Limited activity within patient's tolerance;Repositioned     Hand Dominance Left   Extremity/Trunk Assessment Upper Extremity Assessment Upper Extremity Assessment: Difficult to assess due to impaired cognition;LUE deficits/detail LUE Deficits / Details: Pt with decreased grasp in left hand as seen during ADL tasks. Poor coordination. Pt able to bring L hand to right axilarry area during back. Compensatory hiking noted when pt performs shoulder flexion LUE Sensation: decreased light touch(Pt stating "numb" while holding up left hand) LUE Coordination: decreased fine motor;decreased gross motor   Lower Extremity Assessment Lower Extremity Assessment: Defer to PT evaluation   Cervical / Trunk Assessment Cervical / Trunk Assessment: Normal   Communication Communication Communication: Expressive difficulties   Cognition Arousal/Alertness: Lethargic Behavior During Therapy: WFL for tasks assessed/performed Overall Cognitive Status: Impaired/Different from baseline Area of Impairment: Following commands;Memory;Attention;Safety/judgement;Awareness;Problem solving                   Current Attention Level: Sustained Memory: Decreased short-term memory Following Commands: Follows one step commands inconsistently Safety/Judgement: Decreased awareness of deficits Awareness: Intellectual Problem Solving: Slow processing;Requires verbal cues;Requires tactile cues;Difficulty sequencing;Decreased initiation General  Comments: Pt with poor attention, awareness, following commands, and problem solving.Pt requiring increased time and max, simple cues throughout session. Pt with decreased intitiation of tasks and would required tactile cues to initiate during ADLs. Difficult to fully assess due to speech deficits.    General Comments  Mother present throughout session. Pt reports to be dizzy in upright postures. BP stable    Exercises     Shoulder Instructions      Home Living Family/patient expects to be discharged to:: Private residence Living Arrangements: Alone Available Help at Discharge: Available PRN/intermittently Type of Home: Apartment Home Access: Stairs to enter [employer] of Steps: 1   Home Layout: One level     Bathroom Shower/Tub: Teacher, early years/pre: Standard     Home Equipment: None   Additional Comments: home setup and PLOF from aunt and mom  Lives With: Alone    Prior Functioning/Environment Level of Independence: Independent        Comments: ADLs, IADLs, and doesn't drive        OT Problem List: Decreased strength;Decreased range of motion;Decreased activity tolerance;Impaired balance (sitting and/or standing);Decreased coordination;Decreased cognition;Decreased safety awareness;Impaired UE functional use      OT Treatment/Interventions: Self-care/ADL training;Therapeutic exercise;Energy conservation;DME and/or AE instruction;Therapeutic activities;Patient/family education;Cognitive remediation/compensation    OT Goals(Current goals can be found in the care plan section) Acute Rehab OT Goals Patient Stated Goal: return home OT Goal Formulation: With patient Time For Goal Achievement: 04/04/17 Potential to Achieve Goals: Good ADL Goals Pt Will Perform Grooming: with set-up;with supervision;sitting(VCs for baseline visual deficits) Pt Will Perform Upper Body Dressing: with set-up;with supervision;sitting Pt Will Perform Lower Body  Dressing: with set-up;with supervision;sit to/from stand Pt Will Transfer to Toilet: with min guard assist;stand pivot transfer;bedside commode Additional ADL Goal #1: Pt will demonstrate selective  attention to ADL task with Min VCs  OT Frequency: Min 3X/week   Barriers to D/C:            Co-evaluation              AM-PAC PT "6 Clicks" Daily Activity     Outcome Measure Help from another person eating meals?: A Little Help from another person taking care of personal grooming?: A Lot Help from another person toileting, which includes using toliet, bedpan, or urinal?: A Lot Help from another person bathing (including washing, rinsing, drying)?: A Lot Help from another person to put on and taking off regular upper body clothing?: A Lot Help from another person to put on and taking off regular lower body clothing?: A Lot 6 Click Score: 13   End of Session Equipment Utilized During Treatment: Gait belt Nurse Communication: Mobility status  Activity Tolerance: Patient tolerated treatment well Patient left: in bed;with call bell/phone within reach;with bed alarm set;with nursing/sitter in room;with family/visitor present  OT Visit Diagnosis: Unsteadiness on feet (R26.81);Other abnormalities of gait and mobility (R26.89);Muscle weakness (generalized) (M62.81);Low vision, both eyes (H54.2);Other symptoms and signs involving cognitive function;Hemiplegia and hemiparesis Hemiplegia - Right/Left: Left Hemiplegia - dominant/non-dominant: Dominant Hemiplegia - caused by: Cerebral infarction                Time: 0233-4356 OT Time Calculation (min): 32 min Charges:  OT General Charges $OT Visit: 1 Visit OT Evaluation $OT Eval Moderate Complexity: 1 Mod OT Treatments $Self Care/Home Management : 8-22 mins G-Codes:     Keishaun Hazel MSOT, OTR/L Acute Rehab Pager: [phone] Office: [place] 03/21/2017, 5:10 PM

## 2017-03-21 NOTE — Consult Note (Signed)
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 flow-limiting stenosis of the carotid or vertebral arteries. Critical Value/emergent results were called by telephone at the time of interpretation on 03/20/2017 at 8:20 pm to Dr. Karma Greaser , who verbally acknowledged these results. Electronically Signed: By: Ulyses Jarred M.D. On: 03/20/2017 20:33   Ct Head Code Stroke Wo Contrast  Result Date: 03/20/2017 CLINICAL DATA:  Code stroke. EXAM: CT HEAD WITHOUT CONTRAST TECHNIQUE: Contiguous axial images were obtained from the base of the skull through the vertex without intravenous contrast. COMPARISON:  03/20/2017 CT head FINDINGS: Brain: Right MCA distribution infarct with areas of hypoattenuation in the lateral right frontal lobe, parietal lobe, and superior temporal lobe as well as the right insula. In comparison with the prior CT of the head there is a mild increase in edema and local mass effect. Stable minimal right-to-left midline shift and partial effacement of right lateral ventricle. No interval hemorrhage or stroke identified. Vascular: Persistent contrast is present within the vascular system. Skull: Normal. Negative for fracture or focal lesion. Sinuses/Orbits: No  acute finding. Other: None. ASPECTS [facility] Stroke Program Early CT Score) - Ganglionic level infarction (caudate, lentiform nuclei, internal capsule, insula, M1-M3 cortex): 4 - Supraganglionic infarction (M4-M6 cortex): 1 Total score (0-10 with 10 being normal): 5 IMPRESSION: 1. Right MCA distribution late acute/subacute infarction. Mild interval increase in edema. Stable minimal right-to-left midline shift. No acute hemorrhage. 2. ASPECTS is 5 These results were called by telephone at the time of interpretation on 03/20/2017 at 10:16 pm to Dr. Roland Rack , who verbally acknowledged these results. Electronically Signed   By: Kristine Garbe M.D.   On: 03/20/2017 22:16    Assessment/Plan: Diagnosis: right MCA infarct 1. Does the need for close, 24 hr/day medical supervision in concert with the patient's rehab needs make it unreasonable for this patient to be served in a less intensive setting? Yes 2. 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 Due to bladder management, bowel management, safety, skin/wound care, disease management, medication administration and patient education, does the patient require 24 hr/day rehab nursing? Yes 4. Does the patient require coordinated care of a physician, rehab nurse, PT (1-2 hrs/day, 5 days/week), OT (1-2 hrs/day, 5 days/week) and SLP (1-2 hrs/day, 5 days/week) to address physical and functional deficits in the context of the above medical diagnosis(es)? Yes Addressing deficits in the following areas: balance, endurance, locomotion, strength, transferring, bowel/bladder control, bathing, dressing, feeding, grooming, toileting, cognition, swallowing and psychosocial support  5. Can the patient actively participate in an intensive therapy program of at least 3 hrs of therapy per day at least 5 days per week? Yes 6. The potential for patient to make measurable gains while on inpatient rehab  is good 7. Anticipated functional outcomes upon discharge from inpatient rehab are supervision and min assist  with PT, supervision and min assist with OT, supervision and min assist with SLP. 8. Estimated rehab length of stay to reach the above functional goals is: 20-25 days 9. Anticipated D/C setting: Home 10. Anticipated post D/C treatments: [place] therapy 11. Overall Rehab/Functional Prognosis: excellent  RECOMMENDATIONS: This patient's condition is appropriate for continued rehabilitative care in the following setting: CIR Patient has agreed to participate in recommended program. Potentially Note that insurance prior authorization may be required for reimbursement for recommended care.  Comment: Rehab Admissions Coordinator to follow up.  Thanks,  Meredith Staggers, MD, Mellody Drown    Bary Leriche, PA-C 03/21/2017

## 2017-03-21 NOTE — Progress Notes (Signed)
I came to do the echo but the patient was not in the room. We will do the echo tomorrow.
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
## 2017-03-21 NOTE — Progress Notes (Signed)
LE venous duplex prelim: negative for DVT. Amarea Macdowell Eunice, RDMS, RVT  

## 2017-03-21 NOTE — Progress Notes (Signed)
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 herniation, seizure. This patient's care requires constant monitoring of vital signs, hemodynamics, respiratory and cardiac monitoring, review of multiple databases, neurological assessment, discussion with family, other specialists and medical decision making of high complexity. I had long discussion with aunt and other family members at bedside, updated pt current condition, treatment plan and potential prognosis. They expressed understanding and appreciation. I spent 40 minutes of neurocritical care time in the care of this patient.   Rosalin Hawking, MD PhD Stroke Neurology 03/21/2017 10:20 PM    To contact Stroke Continuity provider, please refer to [email or web address] After hours, contact General Neurology
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
## 2017-03-21 NOTE — Evaluation (Signed)
Physical Therapy Evaluation Patient Details Name: [name] [name] MRN: [id number] DOB: [birth year] Today's Date: 03/21/2017   History of Present Illness  52 y.o. male with a history of blindness who presents with difficulty with speech.  In ED reported right weakness.  CT revealed RIGHT MCA territory infarct with slight left weakness. PMHx: HTN, HLD, colon CA, colostomy  Clinical Impression  Pt with baseline blindness and currently exhibiting expressive and receptive aphasia impacting mobility and progression. Pt able to move all extremities and unable to formally assess strength due to impaired command following. Pt able to stand but having difficulty maintaining arousal and following commands to safely progress OOB at this time. Pt will benefit from acute therapy to maximize function, strength, balance, and safety to decrease burden of care and return pt to prior quality of life. Pt would benefit from CIR.   BP supine 131/75 In sitting 124/83    Follow Up Recommendations CIR;Supervision/Assistance - 24 hour    Equipment Recommendations  Rolling walker with 5" wheels    Recommendations for Other Services OT consult;Speech consult;Rehab consult     Precautions / Restrictions Precautions Precautions: Fall Precaution Comments: blind, aphasia (receptive and expressive)      Mobility  Bed Mobility Overal bed mobility: Needs Assistance Bed Mobility: Sit to Supine;Rolling;Sidelying to Sit Rolling: Min assist Sidelying to sit: Min assist Supine to sit: Min assist     General bed mobility comments: tactile and verbal cues to initiate movement to roll to left and rise from side to sitting. Pt able to maintain sitting balance when eyes open as pt drifts to sleep has posterior left LOB. Pt able to return to supine with min assist of bil legs and able to initially scoot self up in bed but required mod assist to fully scoot as pt not following commands  Transfers Overall transfer level:  Needs assistance   Transfers: Sit to/from Stand Sit to Stand: Min assist;From elevated surface         General transfer comment: min assist to stand from surface x 2 trials with instability in standing and unable to maintain with one person and left knee blocked more than a few seconds. Pt reported initial dizziness and appeared unsteady and uneasy in standing but unable to verbalize. Returned to supine as pt unsafe to pivot with 1 person assist at this time  Ambulation/Gait                Stairs            Wheelchair Mobility    Modified Rankin (Stroke Patients Only) Modified Rankin (Stroke Patients Only) Pre-Morbid Rankin Score: No symptoms Modified Rankin: Severe disability     Balance Overall balance assessment: Needs assistance   Sitting balance-Leahy Scale: Good       Standing balance-Leahy Scale: Poor                               Pertinent Vitals/Pain Pain Assessment: (no apparent pain, unable to state)    Home Living Family/patient expects to be discharged to:: Private residence Living Arrangements: Alone Available Help at Discharge: Available PRN/intermittently Type of Home: Apartment Home Access: Stairs to enter   [employer] of Steps: 1 Home Layout: One level Home Equipment: None Additional Comments: home setup and PLOF from aunt and mom    Prior Function Level of Independence: Independent         Comments: doesn't drive  Hand Dominance        Extremity/Trunk Assessment   Upper Extremity Assessment Upper Extremity Assessment: Difficult to assess due to impaired cognition;Generalized weakness(difficult to assess due to aphasia, moving bil UE and able to grip at least 3/5 bil )    Lower Extremity Assessment Lower Extremity Assessment: Difficult to assess due to impaired cognition;Generalized weakness(unable to formally assess due to aphasia. Pt moving bil LE and tolerating Weight bearing in  standing)    Cervical / Trunk Assessment Cervical / Trunk Assessment: Normal  Communication   Communication: Expressive difficulties  Cognition Arousal/Alertness: Lethargic Behavior During Therapy: WFL for tasks assessed/performed Overall Cognitive Status: Difficult to assess Area of Impairment: Following commands                       Following Commands: Follows one step commands inconsistently       General Comments: pt stating "potty" and "dizzy" spontaneously but no other verbalizations throughout session. pt following single step commands grossly 25% of the session.       General Comments      Exercises     Assessment/Plan    PT Assessment Patient needs continued PT services  PT Problem List Decreased mobility;Decreased safety awareness;Decreased activity tolerance;Decreased cognition;Decreased balance;Decreased knowledge of use of DME;Decreased knowledge of precautions       PT Treatment Interventions Gait training;Therapeutic exercise;Patient/family education;Balance training;Functional mobility training;Neuromuscular re-education;DME instruction;Therapeutic activities;Cognitive remediation    PT Goals (Current goals can be found in the Care Plan section)  Acute Rehab PT Goals Patient Stated Goal: return home PT Goal Formulation: With family Time For Goal Achievement: 04/04/17 Potential to Achieve Goals: Fair    Frequency Min 4X/week   Barriers to discharge Decreased caregiver support family can provide intermittent assist but unsure if 24 hr can be arranged    Co-evaluation               AM-PAC PT "6 Clicks" Daily Activity  Outcome Measure Difficulty turning over in bed (including adjusting bedclothes, sheets and blankets)?: A Little Difficulty moving from lying on back to sitting on the side of the bed? : A Little Difficulty sitting down on and standing up from a chair with arms (e.g., wheelchair, bedside commode, etc,.)?: A Lot Help  needed moving to and from a bed to chair (including a wheelchair)?: Total Help needed walking in hospital room?: Total Help needed climbing 3-5 steps with a railing? : Total 6 Click Score: 11    End of Session Equipment Utilized During Treatment: Gait belt Activity Tolerance: Patient limited by fatigue Patient left: in bed;with bed alarm set;with family/visitor present Nurse Communication: Mobility status;Precautions PT Visit Diagnosis: Other abnormalities of gait and mobility (R26.89);Unsteadiness on feet (R26.81)    Time: 4010-2725 PT Time Calculation (min) (ACUTE ONLY): 32 min   Charges:   PT Evaluation $PT Eval Moderate Complexity: 1 Mod PT Treatments $Therapeutic Activity: 8-22 mins   PT G Codes:        Elwyn Reach, PT [phone]   [place] B Gresia Isidoro 03/21/2017, 10:38 AM

## 2017-03-22 ENCOUNTER — Other Ambulatory Visit: Payer: Self-pay

## 2017-03-22 ENCOUNTER — Inpatient Hospital Stay (HOSPITAL_COMMUNITY): Payer: Medicaid Other

## 2017-03-22 ENCOUNTER — Encounter (HOSPITAL_COMMUNITY): Payer: Self-pay | Admitting: *Deleted

## 2017-03-22 DIAGNOSIS — C189 Malignant neoplasm of colon, unspecified: Secondary | ICD-10-CM

## 2017-03-22 DIAGNOSIS — H543 Unqualified visual loss, both eyes: Secondary | ICD-10-CM

## 2017-03-22 DIAGNOSIS — I10 Essential (primary) hypertension: Secondary | ICD-10-CM

## 2017-03-22 LAB — CBC
HEMATOCRIT: 39 % (ref 39.0–52.0)
Hemoglobin: 12.9 g/dL — ABNORMAL LOW (ref 13.0–17.0)
MCH: 28.4 pg (ref 26.0–34.0)
MCHC: 33.1 g/dL (ref 30.0–36.0)
MCV: 85.9 fL (ref 78.0–100.0)
Platelets: 173 10*3/uL (ref 150–400)
RBC: 4.54 MIL/uL (ref 4.22–5.81)
RDW: 14.7 % (ref 11.5–15.5)
WBC: 5 10*3/uL (ref 4.0–10.5)

## 2017-03-22 LAB — BASIC METABOLIC PANEL
ANION GAP: 12 (ref 5–15)
BUN: 9 mg/dL (ref 6–20)
CHLORIDE: 104 mmol/L (ref 101–111)
CO2: 22 mmol/L (ref 22–32)
Calcium: 8.6 mg/dL — ABNORMAL LOW (ref 8.9–10.3)
Creatinine, Ser: 1.02 mg/dL (ref 0.61–1.24)
Glucose, Bld: 107 mg/dL — ABNORMAL HIGH (ref 65–99)
POTASSIUM: 3.4 mmol/L — AB (ref 3.5–5.1)
SODIUM: 138 mmol/L (ref 135–145)

## 2017-03-22 MED ORDER — ONDANSETRON HCL 4 MG/2ML IJ SOLN
4.0000 mg | Freq: Four times a day (QID) | INTRAMUSCULAR | Status: DC | PRN
  Administered 2017-03-22 – 2017-03-23 (×2): 4 mg via INTRAVENOUS
  Filled 2017-03-22 (×2): qty 2

## 2017-03-22 NOTE — Progress Notes (Signed)
New Admission Note:  Arrival Method: On bed from ICU  Mental Orientation: unable to assess patient is aphasic  Telemetry:3w28 Assessment: Completed Skin:L knee abrasion  IV: R ac & L ac Safety Measures: Safety Fall Prevention Plan was given, discussed. 8G95: Patient has been orientated to the room, unit and the staff. Family:Mother at the bedside   Orders have been reviewed and implemented. Will continue to monitor the patient. Call light has been placed within reach and bed alarm has been activated.   Arta Silence ,RN

## 2017-03-22 NOTE — Progress Notes (Signed)
Called MRI twice throughout shift, I was told that they are backed up at this time and will call when able to take pt down. Will continue to monitor

## 2017-03-22 NOTE — Progress Notes (Signed)
I spoke with pt's Uncle, Harriet Pho, by phone who is his legal POA. I also was contacted by pt's sister, Thomes Lolling. We discussed goals and expectations of an inpt rehab admit. They are all in agreement to admit when medically ready. [phone]
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
## 2017-03-22 NOTE — Progress Notes (Signed)
STROKE TEAM PROGRESS NOTE   SUBJECTIVE (INTERVAL HISTORY) [name] is at the bedside.  Pt more awake alert today, eyes open, able to repeat words but not sentences.  Still has significant expressive aphasia, but able to follow most simple commands.  Complains of mild headache and nausea, repeat MRI showed stable right MCA infarct with minimal midline shift.   OBJECTIVE Temp:  [98.6 F (37 C)-99 F (37.2 C)] 98.9 F (37.2 C) (02/13 1200) Pulse Rate:  [49-69] 56 (02/13 1200) Cardiac Rhythm: Normal sinus rhythm;Sinus bradycardia (02/13 0800) Resp:  [13-25] 19 (02/13 1200) BP: (128-173)/(77-106) 138/81 (02/13 1200) SpO2:  [92 %-99 %] 94 % (02/13 1200)  Recent Labs  Lab 03/20/17 1805  GLUCAP 83   Recent Labs  Lab 03/20/17 1800 03/21/17 0002 03/21/17 0731 03/22/17 0440  NA 137  --  139 138  K 3.4*  --  3.4* 3.4*  CL 102  --  105 104  CO2 24  --  24 22  GLUCOSE 100*  --  118* 107*  BUN 13  --  8 9  CREATININE 0.95 1.02 1.01 1.02  CALCIUM 9.1  --  8.6* 8.6*   Recent Labs  Lab 03/20/17 1800  AST 26  ALT 29  ALKPHOS 65  BILITOT 0.9  PROT 8.1  ALBUMIN 4.1   Recent Labs  Lab 03/20/17 1800 03/21/17 0002 03/22/17 0614  WBC 7.1 7.3 5.0  NEUTROABS 5.2  --   --   HGB 14.6 13.8 12.9*  HCT 43.0 40.6 39.0  MCV 85.4 85.7 85.9  PLT 190 183 173   Recent Labs  Lab 03/20/17 1800  TROPONINI <0.03   Recent Labs    03/20/17 1858  LABPROT 12.4  INR 0.93   No results for input(s): COLORURINE, LABSPEC, PHURINE, GLUCOSEU, HGBUR, BILIRUBINUR, KETONESUR, PROTEINUR, UROBILINOGEN, NITRITE, LEUKOCYTESUR in the last 72 hours.  Invalid input(s): APPERANCEUR     Component Value Date/Time   CHOL 263 (H) 03/21/2017 0002   TRIG 160 (H) 03/21/2017 0002   HDL 40 (L) 03/21/2017 0002   CHOLHDL 6.6 03/21/2017 0002   VLDL 32 03/21/2017 0002   LDLCALC 191 (H) 03/21/2017 0002   Lab Results  Component Value Date   HGBA1C 6.0 (H) 03/21/2017   No results found for: LABOPIA,  [place], [place], [place], [place], [place]  Recent Labs  Lab 03/20/17 [place] <10    I have personally reviewed the radiological images below and agree with the radiology interpretations.  Ct Angio Head W Or Wo Contrast  Addendum Date: 03/20/2017   ADDENDUM REPORT: 03/20/2017 21:18 ADDENDUM: This addendum is provided following telephone discussion with Dr. Roland Rack at 9:05 p.m. on and further comparison with the earlier performed noncontrast head CT. The noncontrast head CT shows extensive hypoattenuation throughout the right MCA distribution consistent with infarct. ASPECTS is 6. The lack of core infarct by CBF criteria is artifactual due to luxury perfusion. Electronically Signed   By: Ulyses Jarred M.D.   On: 03/20/2017 21:18   Result Date: 03/20/2017 CLINICAL DATA:  Unresponsive patient.  Abnormal head CT. EXAM: CT ANGIOGRAPHY HEAD AND NECK CT PERFUSION BRAIN TECHNIQUE: Multidetector CT imaging of the head and neck was performed using the standard protocol during bolus administration of intravenous contrast. Multiplanar CT image reconstructions and MIPs were obtained to evaluate the vascular anatomy. Carotid stenosis measurements (when applicable) are obtained utilizing NASCET criteria, using the distal internal carotid diameter as the denominator. Multiphase CT imaging of the brain was performed following  IV bolus contrast injection. Subsequent parametric perfusion maps were calculated using RAPID software. CONTRAST:  135mL ISOVUE-370 IOPAMIDOL (ISOVUE-370) INJECTION 76% COMPARISON:  Head CT 03/20/2017 FINDINGS: CTA NECK FINDINGS Aortic arch: There is no calcific atherosclerosis of the aortic arch. There is no aneurysm, dissection or hemodynamically significant stenosis of the visualized ascending aorta and aortic arch. Normal variant aortic arch branching pattern with the left vertebral artery arising independently from the aortic arch. The visualized proximal subclavian arteries  are normal. Right carotid system: The right common carotid origin is widely patent. There is no common carotid or internal carotid artery dissection or aneurysm. No hemodynamically significant stenosis. Left carotid system: The left common carotid origin is widely patent. There is no common carotid or internal carotid artery dissection or aneurysm. No hemodynamically significant stenosis. Vertebral arteries: The vertebral system is codominant. Both vertebral artery origins are normal. Both vertebral arteries are normal to their confluence with the basilar artery. Skeleton: There is no bony spinal canal stenosis. No lytic or blastic lesions. Other neck: The nasopharynx is clear. The oropharynx and hypopharynx are normal. The epiglottis is normal. The supraglottic larynx, glottis and subglottic larynx are normal. No retropharyngeal collection. The parapharyngeal spaces are preserved. The parotid and submandibular glands are normal. No sialolithiasis or salivary ductal dilatation. The thyroid gland is normal. There is no cervical lymphadenopathy. Upper chest: No pneumothorax or pleural effusion. No nodules or masses. Review of the MIP images confirms the above findings CTA HEAD FINDINGS Anterior circulation: --Intracranial internal carotid arteries: Normal. --Anterior cerebral arteries: Normal. --Middle cerebral arteries: There is occlusion of the distal M1 segment of the right middle cerebral artery. There is intermediate collateralization in the right MCA territory. Normal left MCA. --Posterior communicating arteries: Absent bilaterally. Posterior circulation: --Posterior cerebral arteries: Normal. --Superior cerebellar arteries: Normal. --Basilar artery: Normal. --Anterior inferior cerebellar arteries: Normal. --Posterior inferior cerebellar arteries: Normal. Venous sinuses: As permitted by contrast timing, patent. Anatomic variants: None Delayed phase: No parenchymal contrast enhancement. Review of the MIP images  confirms the above findings. CT Brain Perfusion Findings: CBF (<30%) Volume: 64mL Perfusion (Tmax>6.0s) volume: 14mL Mismatch Volume: 192mL Infarction Location:Right MCA territory IMPRESSION: 1. Emergent large vessel occlusion of the distal M 1 segment of the right middle cerebral artery with intermediate distal collateralization. 2. Ischemic penumbra volume of 101 mL without core infarction by cerebral blood flow criterion. 3. No hemorrhage or mass effect. 4. No occlusion or flow-limiting stenosis of the carotid or vertebral arteries. Critical Value/emergent results were called by telephone at the time of interpretation on 03/20/2017 at 8:20 pm to Dr. Karma Greaser , who verbally acknowledged these results. Electronically Signed: By: Ulyses Jarred M.D. On: 03/20/2017 20:33   Ct Head Wo Contrast  Result Date: 03/21/2017 CLINICAL DATA:  Stroke, follow-up, history hypertension, colorectal cancer EXAM: CT HEAD WITHOUT CONTRAST TECHNIQUE: Contiguous axial images were obtained from the base of the skull through the vertex without intravenous contrast. Sagittal and coronal MPR images reconstructed from axial data set. COMPARISON:  03/20/2017 at 2213 hours FINDINGS: Brain: Normal ventricular morphology. Progressive low attenuation in RIGHT hemisphere consistent with evolving subacute RIGHT MCA territory infarct. Infarct extends to involve the RIGHT basal ganglia. No hemorrhagic transformation. Minimal RIGHT to LEFT midline shift. No new areas of hemorrhage or infarction. No extra-axial collections. Vascular: Question high attenuation within a few small branches of the RIGHT middle cerebral artery. Skull: Intact, unremarkable Sinuses/Orbits: Clear Other: N/A IMPRESSION: Involving nonhemorrhagic RIGHT MCA territory infarct involving the lateral RIGHT frontal, parietal, and temporal  lobes as well as RIGHT basal ganglia. Minimal RIGHT to LEFT midline shift. No evidence of hemorrhagic transformation or new area of infarction.  Electronically Signed   By: Lavonia Dana M.D.   On: 03/21/2017 15:26   Ct Head Wo Contrast  Result Date: 03/20/2017 CLINICAL DATA:  Altered level of consciousness. EXAM: CT HEAD WITHOUT CONTRAST TECHNIQUE: Contiguous axial images were obtained from the base of the skull through the vertex without intravenous contrast. COMPARISON:  None. FINDINGS: Brain: There is loss of gray-white matter distinction in the right posterior frontal and temporoparietal lobes with slight positive mass effect on the adjacent right lateral ventricle. No acute intracranial hemorrhage is noted. There is no hydrocephalus. Midline fourth ventricle and basal cisterns. No intra-axial mass nor extra-axial fluid is noted. Right basal ganglial hypodensities also present. Vascular: Hyperdense vessels noted along the expected location of the distal M1 and possibly M2 branches of the MCA. Skull: Negative for fracture or focal lesion. Sinuses/Orbits: No acute finding. Other: None IMPRESSION: 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 Hyperdense vessels off the distal right MCA compatible with thrombosed vessels. These results were called by telephone at the time of interpretation on 03/20/2017 at 6:52 pm to Dr. Hinda Kehr , who verbally acknowledged these results. Electronically Signed   By: Ashley Royalty M.D.   On: 03/20/2017 18:52   Ct Angio Neck W Or Wo Contrast  Addendum Date: 03/20/2017   ADDENDUM REPORT: 03/20/2017 21:18 ADDENDUM: This addendum is provided following telephone discussion with Dr. Roland Rack at 9:05 p.m. on and further comparison with the earlier performed noncontrast head CT. The noncontrast head CT shows extensive hypoattenuation throughout the right MCA distribution consistent with infarct. ASPECTS is 6. The lack of core infarct by CBF criteria is artifactual due to luxury perfusion. Electronically Signed   By: Ulyses Jarred M.D.   On: 03/20/2017 21:18   Result Date: 03/20/2017 CLINICAL DATA:  Unresponsive patient.  Abnormal head CT. EXAM: CT ANGIOGRAPHY HEAD AND NECK CT PERFUSION BRAIN TECHNIQUE: Multidetector CT imaging of the head and neck was performed using the standard protocol during bolus administration of intravenous contrast. Multiplanar CT image reconstructions and MIPs were obtained to evaluate the vascular anatomy. Carotid stenosis measurements (when applicable) are obtained utilizing NASCET criteria, using the distal internal carotid diameter as the denominator. Multiphase CT imaging of the brain was performed following IV bolus contrast injection. Subsequent parametric perfusion maps were calculated using RAPID software. CONTRAST:  154mL ISOVUE-370 IOPAMIDOL (ISOVUE-370) INJECTION 76% COMPARISON:  Head CT 03/20/2017 FINDINGS: CTA NECK FINDINGS Aortic arch: There is no calcific atherosclerosis of the aortic arch. There is no aneurysm, dissection or hemodynamically significant stenosis of the visualized ascending aorta and aortic arch. Normal variant aortic arch branching pattern with the left vertebral artery arising independently from the aortic arch. The visualized proximal subclavian arteries are normal. Right carotid system: The right common carotid origin is widely patent. There is no common carotid or internal carotid artery dissection or aneurysm. No hemodynamically significant stenosis. Left carotid system: The left common carotid origin is widely patent. There is no common carotid or internal carotid artery dissection or aneurysm. No hemodynamically significant stenosis. Vertebral arteries: The vertebral system is codominant. Both vertebral artery origins are normal. Both vertebral arteries are normal to their confluence with the basilar artery. Skeleton: There is no bony spinal canal stenosis. No lytic or blastic lesions. Other neck: The nasopharynx is clear. The  oropharynx and hypopharynx are normal.  The epiglottis is normal. The supraglottic larynx, glottis and subglottic larynx are normal. No retropharyngeal collection. The parapharyngeal spaces are preserved. The parotid and submandibular glands are normal. No sialolithiasis or salivary ductal dilatation. The thyroid gland is normal. There is no cervical lymphadenopathy. Upper chest: No pneumothorax or pleural effusion. No nodules or masses. Review of the MIP images confirms the above findings CTA HEAD FINDINGS Anterior circulation: --Intracranial internal carotid arteries: Normal. --Anterior cerebral arteries: Normal. --Middle cerebral arteries: There is occlusion of the distal M1 segment of the right middle cerebral artery. There is intermediate collateralization in the right MCA territory. Normal left MCA. --Posterior communicating arteries: Absent bilaterally. Posterior circulation: --Posterior cerebral arteries: Normal. --Superior cerebellar arteries: Normal. --Basilar artery: Normal. --Anterior inferior cerebellar arteries: Normal. --Posterior inferior cerebellar arteries: Normal. Venous sinuses: As permitted by contrast timing, patent. Anatomic variants: None Delayed phase: No parenchymal contrast enhancement. Review of the MIP images confirms the above findings. CT Brain Perfusion Findings: CBF (<30%) Volume: 29mL Perfusion (Tmax>6.0s) volume: 155mL Mismatch Volume: 170mL Infarction Location:Right MCA territory IMPRESSION: 1. Emergent large vessel occlusion of the distal M 1 segment of the right middle cerebral artery with intermediate distal collateralization. 2. Ischemic penumbra volume of 101 mL without core infarction by cerebral blood flow criterion. 3. No hemorrhage or mass effect. 4. No occlusion or flow-limiting stenosis of the carotid or vertebral arteries. Critical Value/emergent results were called by telephone at the time of interpretation on 03/20/2017 at 8:20 pm to Dr. Karma Greaser , who verbally acknowledged these results. Electronically  Signed: By: Ulyses Jarred M.D. On: 03/20/2017 20:33   Mr Jodene Nam Head Wo Contrast  Result Date: 03/22/2017 CLINICAL DATA:  Right MCA infarct. EXAM: MRI HEAD WITHOUT CONTRAST MRA HEAD WITHOUT CONTRAST TECHNIQUE: Multiplanar, multiecho pulse sequences of the brain and surrounding structures were obtained without intravenous contrast. Angiographic images of the head were obtained using MRA technique without contrast. COMPARISON:  Head CT 03/21/2017 and CTA 03/20/2017 FINDINGS: MRI HEAD FINDINGS Brain: As seen on the recent CT, there is a large acute right MCA infarct involving the temporal greater than frontal and parietal lobes. The insula, external capsule, and right lentiform nucleus are also involved. The overall extent of the infarction is similar to the prior CT. There is associated cytotoxic edema with regional mass effect, sulcal effacement, and 3 mm of leftward midline shift, unchanged. No associated hemorrhage is identified. There is no ventriculomegaly. The basilar cisterns are patent. Scattered small foci of T2 hyperintensity in the left cerebral hemispheric white matter are nonspecific but compatible with mild chronic small vessel ischemic disease. Vascular: Major intracranial vascular flow voids are preserved. FLAIR hyperintensity in right MCA branch vessels in the sylvian fissure likely reflects reduced flow. Skull and upper cervical spine: Unremarkable bone marrow signal. Sinuses/Orbits: Unremarkable orbits. Paranasal sinuses and mastoid air cells are clear. Other: None. MRA HEAD FINDINGS The visualized distal vertebral arteries are widely patent to the basilar. PICA origins were not imaged. Right AICA and bilateral SCA is are grossly patent. Left AICA is not identified. The basilar artery is widely patent. Posterior communicating arteries are not identified and may be diminutive or absent. PCAs are patent without evidence of significant stenosis. The internal carotid arteries are widely patent from  skull base to carotid termini. There is persistent complete or near complete occlusion of the distal right M1 segment at the bifurcation. No significant flow related enhancement is identified in the right MCA inferior division. Major right M2 superior  division branch vessels appear patent but are narrowed proximally at the bifurcation. Left MCA and both ACAs are patent without evidence of proximal branch occlusion or significant stenosis. No aneurysm is identified. IMPRESSION: 1. Evolving large acute right MCA infarct without hemorrhage. Mild mass effect with 3 mm of leftward midline shift, unchanged. 2. Mild chronic small vessel ischemic disease. 3. Persistent complete or near complete distal right M1 occlusion at the MCA bifurcation. Patent proximal right M2 superior division branch vessels. No flow related enhancement in the right MCA inferior division which may reflect occlusion or collateralized slow flow below the resolution of MRA. Electronically Signed   By: Logan Bores M.D.   On: 03/22/2017 11:32   Mr Brain Wo Contrast  Result Date: 03/22/2017 CLINICAL DATA:  Right MCA infarct. EXAM: MRI HEAD WITHOUT CONTRAST MRA HEAD WITHOUT CONTRAST TECHNIQUE: Multiplanar, multiecho pulse sequences of the brain and surrounding structures were obtained without intravenous contrast. Angiographic images of the head were obtained using MRA technique without contrast. COMPARISON:  Head CT 03/21/2017 and CTA 03/20/2017 FINDINGS: MRI HEAD FINDINGS Brain: As seen on the recent CT, there is a large acute right MCA infarct involving the temporal greater than frontal and parietal lobes. The insula, external capsule, and right lentiform nucleus are also involved. The overall extent of the infarction is similar to the prior CT. There is associated cytotoxic edema with regional mass effect, sulcal effacement, and 3 mm of leftward midline shift, unchanged. No associated hemorrhage is identified. There is no ventriculomegaly. The  basilar cisterns are patent. Scattered small foci of T2 hyperintensity in the left cerebral hemispheric white matter are nonspecific but compatible with mild chronic small vessel ischemic disease. Vascular: Major intracranial vascular flow voids are preserved. FLAIR hyperintensity in right MCA branch vessels in the sylvian fissure likely reflects reduced flow. Skull and upper cervical spine: Unremarkable bone marrow signal. Sinuses/Orbits: Unremarkable orbits. Paranasal sinuses and mastoid air cells are clear. Other: None. MRA HEAD FINDINGS The visualized distal vertebral arteries are widely patent to the basilar. PICA origins were not imaged. Right AICA and bilateral SCA is are grossly patent. Left AICA is not identified. The basilar artery is widely patent. Posterior communicating arteries are not identified and may be diminutive or absent. PCAs are patent without evidence of significant stenosis. The internal carotid arteries are widely patent from skull base to carotid termini. There is persistent complete or near complete occlusion of the distal right M1 segment at the bifurcation. No significant flow related enhancement is identified in the right MCA inferior division. Major right M2 superior division branch vessels appear patent but are narrowed proximally at the bifurcation. Left MCA and both ACAs are patent without evidence of proximal branch occlusion or significant stenosis. No aneurysm is identified. IMPRESSION: 1. Evolving large acute right MCA infarct without hemorrhage. Mild mass effect with 3 mm of leftward midline shift, unchanged. 2. Mild chronic small vessel ischemic disease. 3. Persistent complete or near complete distal right M1 occlusion at the MCA bifurcation. Patent proximal right M2 superior division branch vessels. No flow related enhancement in the right MCA inferior division which may reflect occlusion or collateralized slow flow below the resolution of MRA. Electronically Signed   By:  Logan Bores M.D.   On: 03/22/2017 11:32   Ct Cerebral Perfusion W Contrast  Result Date: 03/20/2017 CLINICAL DATA:  Weakness.  Right MCA distribution infarct. EXAM: CT PERFUSION BRAIN TECHNIQUE: Multiphase CT imaging of the brain was performed following IV bolus contrast injection. Subsequent parametric  perfusion maps were calculated using RAPID software. CONTRAST:  40 mL Isovue 370 COMPARISON:  Head CT 03/20/2017 CT angiography and perfusion study 03/20/2017 FINDINGS: Review of source images and earlier noncontrast head CT in shows extensive hypoattenuation throughout much of the right MCA distribution. Enhancement within the demonstrated area of right MCA infarct is indicative of luxury perfusion. CT Brain Perfusion Findings: CBF (<30%) Volume: 0 mL. Perfusion (Tmax>6.0s) volume: 89 mLmL Mismatch Volume: 89 mL. Infarction Location:Right MCA distribution. The above cerebral blood flow and mismatch volume data are misleading, as the normal cerebral blood flow values are due to luxury perfusion in the distribution of the infarct. Most of the area of elevated Tmax corresponds to the hypodense infarcted tissue on the noncontrast CT, suggesting that this area is mostly core infarct rather than a large area of ischemic penumbra. IMPRESSION: Large right MCA distribution infarct with misleading CTP data secondary to luxury perfusion phenomenon. The area of elevated mean transit time largely corresponds to the infarcted area demonstrated on noncontrast CT and is mostly core infarct. Electronically Signed   By: Ulyses Jarred M.D.   On: 03/20/2017 22:49   Ct Cerebral Perfusion W Contrast  Addendum Date: 03/20/2017   ADDENDUM REPORT: 03/20/2017 21:18 ADDENDUM: This addendum is provided following telephone discussion with Dr. Roland Rack at 9:05 p.m. on and further comparison with the earlier performed noncontrast head CT. The noncontrast head CT shows extensive hypoattenuation throughout the right MCA  distribution consistent with infarct. ASPECTS is 6. The lack of core infarct by CBF criteria is artifactual due to luxury perfusion. Electronically Signed   By: Ulyses Jarred M.D.   On: 03/20/2017 21:18   Result Date: 03/20/2017 CLINICAL DATA:  Unresponsive patient.  Abnormal head CT. EXAM: CT ANGIOGRAPHY HEAD AND NECK CT PERFUSION BRAIN TECHNIQUE: Multidetector CT imaging of the head and neck was performed using the standard protocol during bolus administration of intravenous contrast. Multiplanar CT image reconstructions and MIPs were obtained to evaluate the vascular anatomy. Carotid stenosis measurements (when applicable) are obtained utilizing NASCET criteria, using the distal internal carotid diameter as the denominator. Multiphase CT imaging of the brain was performed following IV bolus contrast injection. Subsequent parametric perfusion maps were calculated using RAPID software. CONTRAST:  186mL ISOVUE-370 IOPAMIDOL (ISOVUE-370) INJECTION 76% COMPARISON:  Head CT 03/20/2017 FINDINGS: CTA NECK FINDINGS Aortic arch: There is no calcific atherosclerosis of the aortic arch. There is no aneurysm, dissection or hemodynamically significant stenosis of the visualized ascending aorta and aortic arch. Normal variant aortic arch branching pattern with the left vertebral artery arising independently from the aortic arch. The visualized proximal subclavian arteries are normal. Right carotid system: The right common carotid origin is widely patent. There is no common carotid or internal carotid artery dissection or aneurysm. No hemodynamically significant stenosis. Left carotid system: The left common carotid origin is widely patent. There is no common carotid or internal carotid artery dissection or aneurysm. No hemodynamically significant stenosis. Vertebral arteries: The vertebral system is codominant. Both vertebral artery origins are normal. Both vertebral arteries are normal to their confluence with the basilar  artery. Skeleton: There is no bony spinal canal stenosis. No lytic or blastic lesions. Other neck: The nasopharynx is clear. The oropharynx and hypopharynx are normal. The epiglottis is normal. The supraglottic larynx, glottis and subglottic larynx are normal. No retropharyngeal collection. The parapharyngeal spaces are preserved. The parotid and submandibular glands are normal. No sialolithiasis or salivary ductal dilatation. The thyroid gland is normal. There is no cervical lymphadenopathy.  Upper chest: No pneumothorax or pleural effusion. No nodules or masses. Review of the MIP images confirms the above findings CTA HEAD FINDINGS Anterior circulation: --Intracranial internal carotid arteries: Normal. --Anterior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

## 2017-03-22 NOTE — Progress Notes (Signed)
SLP Cancellation Note  Patient Details Name: [name] [name] MRN: [id number] DOB: [birth year]   Cancelled treatment:       Reason Eval/Treat Not Completed: Patient at procedure or test/unavailable   [name] 03/22/2017, 10:11 AM

## 2017-03-23 ENCOUNTER — Inpatient Hospital Stay (HOSPITAL_COMMUNITY): Payer: Medicaid Other

## 2017-03-23 DIAGNOSIS — I63511 Cerebral infarction due to unspecified occlusion or stenosis of right middle cerebral artery: Secondary | ICD-10-CM

## 2017-03-23 LAB — CBC
HEMATOCRIT: 40 % (ref 39.0–52.0)
Hemoglobin: 13.2 g/dL (ref 13.0–17.0)
MCH: 28.8 pg (ref 26.0–34.0)
MCHC: 33 g/dL (ref 30.0–36.0)
MCV: 87.3 fL (ref 78.0–100.0)
Platelets: 179 10*3/uL (ref 150–400)
RBC: 4.58 MIL/uL (ref 4.22–5.81)
RDW: 14.8 % (ref 11.5–15.5)
WBC: 5.7 10*3/uL (ref 4.0–10.5)

## 2017-03-23 LAB — LUPUS ANTICOAGULANT PANEL
DRVVT: 38.6 s (ref 0.0–47.0)
PTT LA: 29.9 s (ref 0.0–51.9)

## 2017-03-23 LAB — BASIC METABOLIC PANEL
ANION GAP: 10 (ref 5–15)
BUN: 7 mg/dL (ref 6–20)
CHLORIDE: 105 mmol/L (ref 101–111)
CO2: 25 mmol/L (ref 22–32)
Calcium: 8.5 mg/dL — ABNORMAL LOW (ref 8.9–10.3)
Creatinine, Ser: 1.08 mg/dL (ref 0.61–1.24)
Glucose, Bld: 102 mg/dL — ABNORMAL HIGH (ref 65–99)
POTASSIUM: 3.3 mmol/L — AB (ref 3.5–5.1)
Sodium: 140 mmol/L (ref 135–145)

## 2017-03-23 LAB — ECHOCARDIOGRAM COMPLETE
Height: 74 in
WEIGHTICAEL: 3527.36 [oz_av]

## 2017-03-23 LAB — HOMOCYSTEINE: Homocysteine: 17.7 umol/L — ABNORMAL HIGH (ref 0.0–15.0)

## 2017-03-23 LAB — ANTI-DNA ANTIBODY, DOUBLE-STRANDED: DS DNA AB: 1 [IU]/mL (ref 0–9)

## 2017-03-23 MED ORDER — POTASSIUM CHLORIDE CRYS ER 20 MEQ PO TBCR
40.0000 meq | EXTENDED_RELEASE_TABLET | Freq: Two times a day (BID) | ORAL | Status: DC
  Administered 2017-03-23 – 2017-03-24 (×3): 40 meq via ORAL
  Filled 2017-03-23 (×3): qty 2

## 2017-03-23 MED ORDER — LOPERAMIDE HCL 2 MG PO CAPS
2.0000 mg | ORAL_CAPSULE | Freq: Once | ORAL | Status: AC
  Administered 2017-03-23: 2 mg via ORAL
  Filled 2017-03-23: qty 1

## 2017-03-23 MED ORDER — AMLODIPINE BESYLATE 5 MG PO TABS: 5.0000 mg | ORAL_TABLET | ORAL | Status: DC

## 2017-03-23 NOTE — H&P (Signed)
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 8. PT will assess and treat for/with: Lower extremity strength, range of motion, stamina, balance, functional mobility, safety, adaptive techniques and equipment, NMR, spatial awareness, family ed.   Goals are: supervision to min assist. 9. OT will assess and treat for/with: ADL's, functional mobility, safety, upper extremity strength, adaptive techniques and equipment, NMR, spatial awareness, family ed.   Goals are: supervision to min assist. Therapy may proceed with showering this patient. 10. SLP will assess and treat for/with: speech, language, swallowing, cognition, family ed.  Goals are: min  To mod assist. 11. Case Management and Social Worker will assess and treat for psychological issues and discharge planning. 12. Team conference will be held weekly to assess progress toward goals and to determine barriers to discharge. 13. Patient will receive at least 3  hours of therapy per day at least 5 days per week. 14. ELOS: 20-25 days       15. Prognosis:  excellent     Meredith Staggers, MD, [facility] 03/24/2017  Bary Leriche, PA-C 03/23/2017
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
## 2017-03-23 NOTE — Progress Notes (Signed)
  Echocardiogram 2D Echocardiogram has been performed.  [name] [name] F 03/23/2017, 10:46 AM

## 2017-03-23 NOTE — H&P (Deleted)
  The note originally documented on this encounter has been moved the the encounter in which it belongs.  

## 2017-03-23 NOTE — Progress Notes (Signed)
Occupational Therapy Treatment Patient Details Name: [name] [name] MRN: [id number] DOB: 1965-02-22 Today's Date: 03/23/2017    History of present illness 52 y.o. male with a history of blindness who presents with difficulty with speech.  In ED reported right weakness.  CT revealed RIGHT MCA territory infarct with slight left weakness. PMHx: HTN, HLD, colon CA, colostomy   OT comments  Pt fatigued this pm.  He demonstrated significant motor planning deficits as well as possible Lt inattention coupled with communication deficits and h/o blindness .  Required max cues and min A for bed mobility.  He was delayed with ability to follow simple commands and required max tactile and verbal cues.  Pt's mother present - he responds well to her.  Recommend CIR.   Follow Up Recommendations  CIR    Equipment Recommendations  None recommended by OT    Recommendations for Other Services Rehab consult;PT consult;Speech consult    Precautions / Restrictions Precautions Precautions: Fall Precaution Comments: blind, aphasia (receptive and expressive) Restrictions Weight Bearing Restrictions: No       Mobility Bed Mobility Overal bed mobility: Needs Assistance Bed Mobility: Rolling Rolling: Min assist Sidelying to sit: Min assist   Sit to supine: Min assist   General bed mobility comments: Pt required max cues and significantly increased time to move sit to supine.   He repeatedly attempted to lie down to his Rt which was toward the foot of his bed.  He required max tactile, and verbal cues as well as physical guiding for him to shift gears and move toward his Lt .  Required assist to lift LEs onto bed   Transfers Overall transfer level: Needs assistance Equipment used: None Transfers: Stand Pivot Transfers Sit to Stand: Min assist;From elevated surface;+2 physical assistance Stand pivot transfers: Mod assist;+2 physical assistance       General transfer comment: Pt had just returned to  EOB after transfer from [facility] with nursing     Balance Overall balance assessment: Needs assistance Sitting-balance support: No upper extremity supported;Feet supported Sitting balance-Leahy Scale: Fair Sitting balance - Comments: RN reports pt with anterior LOB sitting EOB earlier today; maintains static sit today with PT, LOB to L with dynamic     Standing balance-Leahy Scale: Poor Standing balance comment: Reliant on physical A                           ADL either performed or assessed with clinical judgement   ADL Overall ADL's : Needs assistance/impaired Eating/Feeding: Supervision/ safety Eating/Feeding Details (indicate cue type and reason): Pt able to drink from cup with supervision using bil. hands                                          Vision   Additional Comments: Pt is blind    Perception     Praxis      Cognition Arousal/Alertness: Awake/alert Behavior During Therapy: Restless Overall Cognitive Status: Difficult to assess Area of Impairment: Following commands;Memory;Attention;Safety/judgement;Awareness;Problem solving                   Current Attention Level: Sustained Memory: Decreased short-term memory Following Commands: Follows one step commands inconsistently Safety/Judgement: Decreased awareness of deficits;Decreased awareness of safety   Problem Solving: Slow processing;Requires verbal cues;Requires tactile cues;Difficulty sequencing;Decreased initiation General Comments: Very difficult to accurately assess  cognition due to severity of communication deficits, pt is blind, and he demonstrates motor planning deficits as well as possible Lt inattention.   He will follow one step commands inconsistently with mod - max tactile and verbal cues         Exercises General Exercises - Lower Extremity Long Arc Quad: AROM;AAROM;10 reps;Seated;Limitations Long [employer] Limitations: requires tactile facilitation to initiate    Shoulder Instructions       General Comments Pt sitting EOB after transfer back to bed from [facility] with nsg staff.  He repeatedly indicated he was dizzy.  He was very slow to respond to commands, and with limited engagement with OT.  Required an extensive amount of time to assist pt with moving sit to supine, as he repeatedly attempted to lie down toward the foot of his bed and required multiple attempts as well as max cuing     Pertinent Vitals/ Pain       Pain Assessment: Faces Pain Score: 1  Faces Pain Scale: No hurt  Home Living     Available Help at Discharge: Family;Available 24 hours/day(Mom, nephew, sister and uncle to provide 24/7)                     Bathroom Accessibility: Yes How Accessible: Accessible via walker     Additional Comments: home setup and PLOF from aunt and mom      Prior Functioning/Environment              Frequency  Min 3X/week        Progress Toward Goals  OT Goals(current goals can now be found in the care plan section)  Progress towards OT goals: Progressing toward goals  Acute Rehab OT Goals Patient Stated Goal: return home  Plan Discharge plan remains appropriate    Co-evaluation                 AM-PAC PT "6 Clicks" Daily Activity     Outcome Measure   Help from another person eating meals?: A Lot Help from another person taking care of personal grooming?: A Lot Help from another person toileting, which includes using toliet, bedpan, or urinal?: A Lot Help from another person bathing (including washing, rinsing, drying)?: A Lot Help from another person to put on and taking off regular upper body clothing?: A Lot Help from another person to put on and taking off regular lower body clothing?: Total 6 Click Score: 11    End of Session    OT Visit Diagnosis: Unsteadiness on feet (R26.81);Other abnormalities of gait and mobility (R26.89);Muscle weakness (generalized) (M62.81);Low vision, both eyes (H54.2);Other  symptoms and signs involving cognitive function;Hemiplegia and hemiparesis Hemiplegia - Right/Left: Left Hemiplegia - dominant/non-dominant: Dominant Hemiplegia - caused by: Cerebral infarction   Activity Tolerance Patient limited by fatigue   Patient Left in bed;with call bell/phone within reach;with bed alarm set;with family/visitor present   Nurse Communication Mobility status        Time: 5732-2025 OT Time Calculation (min): 26 min  Charges: OT General Charges $OT Visit: 1 Visit OT Treatments $Neuromuscular Re-education: 23-37 mins  [facility], OTR/L [phone]    Lucille Passy M 03/23/2017, 6:03 PM

## 2017-03-23 NOTE — Progress Notes (Signed)
    [facility] has been requested to perform a transesophageal echocardiogram on [name] [name] for stroke.  After careful review of history and examination, the risks and benefits of transesophageal echocardiogram have been explained including risks of esophageal damage, perforation (1:10,000 risk), bleeding, pharyngeal hematoma as well as other potential complications associated with conscious sedation including aspiration, arrhythmia, respiratory failure and death. Alternatives to treatment were discussed, questions were answered. Patient is willing to proceed. Above also discussed with pt's family who was present.   Kerin Ransom, [place]  03/23/2017 3:18 PM

## 2017-03-23 NOTE — Progress Notes (Addendum)
Physical Therapy Treatment Patient Details Name: [name] [name] MRN: [id number] DOB: October 23, 1965 Today's Date: 03/23/2017    History of Present Illness 52 y.o. male with a history of blindness who presents with difficulty with speech.  In ED reported right weakness.  CT revealed RIGHT MCA territory infarct with slight left weakness. PMHx: HTN, HLD, colon CA, colostomy    PT Comments    Pt progressing toward PT goals; fluctuates between impulsivity and decr initiation this session, requires tactile cues and simple commands to participate with therapy in basic functional tasks; continue to recommend CIR;   Follow Up Recommendations  CIR;Supervision/Assistance - 24 hour     Equipment Recommendations  Other (comment)(defer to CIR)    Recommendations for Other Services       Precautions / Restrictions Precautions Precautions: Fall Precaution Comments: blind, aphasia (receptive and expressive) Restrictions Weight Bearing Restrictions: No    Mobility  Bed Mobility Overal bed mobility: Needs Assistance Bed Mobility: Rolling;Sidelying to Sit Rolling: Min assist(to L x2) Sidelying to sit: Min assist       General bed mobility comments: Min A for intitation of bed mobility, to bring LEs off bed and trunk to upright. Tactile facilitation for anterior translation of trunk   Transfers Overall transfer level: Needs assistance Equipment used: None Transfers: Stand Pivot Transfers Sit to Stand: Min assist;From elevated surface;+2 physical assistance Stand pivot transfers: Mod assist;+2 physical assistance       General transfer comment: Min A +2 for power up into standing. Blocking L knee. Once standing, pt briefly maintains standing with Min A, L knee buckling with wt shift; pt requires +2 assist  and facilitation to pivot to chair   Ambulation/Gait             General Gait Details: Nt d/t safety concerns, difficulty following commands   Stairs             Wheelchair Mobility    Modified Rankin (Stroke Patients Only)       Balance Overall balance assessment: Needs assistance Sitting-balance support: No upper extremity supported;Feet supported Sitting balance-Leahy Scale: Fair Sitting balance - Comments: RN reports pt with anterior LOB sitting EOB earlier today; maintains static sit today with PT, LOB to L with dynamic     Standing balance-Leahy Scale: Poor Standing balance comment: Reliant on physical A                            Cognition Arousal/Alertness: Awake/alert Behavior During Therapy: Restless(grabs IV and catheter tubing repeatedly )   Area of Impairment: Following commands;Memory;Attention;Safety/judgement;Awareness;Problem solving                   Current Attention Level: Sustained Memory: Decreased short-term memory Following Commands: Follows one step commands inconsistently Safety/Judgement: Decreased awareness of deficits;Decreased awareness of safety   Problem Solving: Slow processing;Requires verbal cues;Requires tactile cues;Difficulty sequencing;Decreased initiation General Comments: Pt with poor attention, awareness, following commands, and problem solving. Pt is intermittently, internally distracted and perseverates on catheter; Pt requiring increased time and max, simple cues throughout session. Pt with initially decreased intitiation of basic tasks and requiring tactile cues, then demonstrates impulsivity (attempting to stand without  warning)      Exercises General Exercises - Lower Extremity Long Arc Quad: AROM;AAROM;10 reps;Seated;Limitations Long [employer] Limitations: requires tactile facilitation to initiate    General Comments        Pertinent Vitals/Pain Pain Assessment: Faces Pain Score: 0  Home Living                      Prior Function            PT Goals (current goals can now be found in the care plan section) Acute Rehab PT Goals Patient  Stated Goal: return home PT Goal Formulation: With family Time For Goal Achievement: 04/04/17 Potential to Achieve Goals: Fair Progress towards PT goals: Progressing toward goals    Frequency    Min 4X/week      PT Plan Current plan remains appropriate    Co-evaluation              AM-PAC PT "6 Clicks" Daily Activity  Outcome Measure  Difficulty turning over in bed (including adjusting bedclothes, sheets and blankets)?: Unable Difficulty moving from lying on back to sitting on the side of the bed? : Unable Difficulty sitting down on and standing up from a chair with arms (e.g., wheelchair, bedside commode, etc,.)?: Unable Help needed moving to and from a bed to chair (including a wheelchair)?: A Lot Help needed walking in hospital room?: Total Help needed climbing 3-5 steps with a railing? : Total 6 Click Score: 7    End of Session Equipment Utilized During Treatment: Gait belt Activity Tolerance: Other (comment)(limited ability to follow commands) Patient left: in chair;with call bell/phone within reach;with chair alarm set;with family/visitor present(mother in room) Nurse Communication: Mobility status;Precautions PT Visit Diagnosis: Other abnormalities of gait and mobility (R26.89);Unsteadiness on feet (R26.81)     Time: 1359-1430 PT Time Calculation (min) (ACUTE ONLY): 31 min  Charges:  $Therapeutic Activity: 8-22 mins                    G CodesKenyon Ana, PT Pager: [phone] 03/23/2017    [facility] 03/23/2017, 2:53 PM

## 2017-03-23 NOTE — Progress Notes (Signed)
STROKE TEAM PROGRESS NOTE   SUBJECTIVE (INTERVAL HISTORY) His aunt/sister are at the bedside.  Pt again more awake alert and interactive today, will repeat some words but not sentences.  Continues to have significant expressive aphasia. Follows most simple commands.  No c/o H/A or nausea on exam today. Plan for TEE/Loop in AM and then discharge to CIR.  OBJECTIVE Temp:  [98 F (36.7 C)-98.8 F (37.1 C)] 98 F (36.7 C) (02/14 1400) Pulse Rate:  [49-62] 60 (02/14 1400) Cardiac Rhythm: Sinus bradycardia;Heart block (02/14 0730) Resp:  [14-22] 15 (02/14 1400) BP: (95-138)/(68-91) 138/68 (02/14 1400) SpO2:  [95 %-99 %] 97 % (02/14 1400) Weight:  [100 kg (220 lb 7.4 oz)] 100 kg (220 lb 7.4 oz) (02/13 2134)  Recent Labs  Lab 03/20/17 1805  GLUCAP 83   Recent Labs  Lab 03/20/17 1800 03/21/17 0002 03/21/17 0731 03/22/17 0440 03/23/17 0208  NA 137  --  139 138 140  K 3.4*  --  3.4* 3.4* 3.3*  CL 102  --  105 104 105  CO2 24  --  24 22 25   GLUCOSE 100*  --  118* 107* 102*  BUN 13  --  8 9 7   CREATININE 0.95 1.02 1.01 1.02 1.08  CALCIUM 9.1  --  8.6* 8.6* 8.5*   Recent Labs  Lab 03/20/17 1800  AST 26  ALT 29  ALKPHOS 65  BILITOT 0.9  PROT 8.1  ALBUMIN 4.1   Recent Labs  Lab 03/20/17 1800 03/21/17 0002 03/22/17 0614 03/23/17 0208  WBC 7.1 7.3 5.0 5.7  NEUTROABS 5.2  --   --   --   HGB 14.6 13.8 12.9* 13.2  HCT 43.0 40.6 39.0 40.0  MCV 85.4 85.7 85.9 87.3  PLT 190 183 173 179   Recent Labs  Lab 03/20/17 1800  TROPONINI <0.03   Recent Labs    03/20/17 1858  LABPROT 12.4  INR 0.93   No results for input(s): COLORURINE, LABSPEC, PHURINE, GLUCOSEU, HGBUR, BILIRUBINUR, KETONESUR, PROTEINUR, UROBILINOGEN, NITRITE, LEUKOCYTESUR in the last 72 hours.  Invalid input(s): APPERANCEUR     Component Value Date/Time   CHOL 263 (H) 03/21/2017 0002   TRIG 160 (H) 03/21/2017 0002   HDL 40 (L) 03/21/2017 0002   CHOLHDL 6.6 03/21/2017 0002   VLDL 32 03/21/2017 0002    LDLCALC 191 (H) 03/21/2017 0002   Lab Results  Component Value Date   HGBA1C 6.0 (H) 03/21/2017   No results found for: LABOPIA, [place], [place], [place], [place], [place]  Recent Labs  Lab 03/20/17 [place] <10    I have personally reviewed the radiological images below and agree with the radiology interpretations.  Ct Angio Head W Or Wo Contrast  Addendum Date: 03/20/2017   ADDENDUM REPORT: 03/20/2017 21:18 ADDENDUM: This addendum is provided following telephone discussion with Dr. Roland Rack at 9:05 p.m. on and further comparison with the earlier performed noncontrast head CT. The noncontrast head CT shows extensive hypoattenuation throughout the right MCA distribution consistent with infarct. ASPECTS is 6. The lack of core infarct by CBF criteria is artifactual due to luxury perfusion. Electronically Signed   By: Ulyses Jarred M.D.   On: 03/20/2017 21:18   Result Date: 03/20/2017 CLINICAL DATA:  Unresponsive patient.  Abnormal head CT. EXAM: CT ANGIOGRAPHY HEAD AND NECK CT PERFUSION BRAIN TECHNIQUE: Multidetector CT imaging of the head and neck was performed using the standard protocol during bolus administration of intravenous contrast. Multiplanar CT image reconstructions and MIPs were  obtained to evaluate the vascular anatomy. Carotid stenosis measurements (when applicable) are obtained utilizing NASCET criteria, using the distal internal carotid diameter as the denominator. Multiphase CT imaging of the brain was performed following IV bolus contrast injection. Subsequent parametric perfusion maps were calculated using RAPID software. CONTRAST:  193mL ISOVUE-370 IOPAMIDOL (ISOVUE-370) INJECTION 76% COMPARISON:  Head CT 03/20/2017 FINDINGS: CTA NECK FINDINGS Aortic arch: There is no calcific atherosclerosis of the aortic arch. There is no aneurysm, dissection or hemodynamically significant stenosis of the visualized ascending aorta and aortic arch. Normal variant aortic arch  branching pattern with the left vertebral artery arising independently from the aortic arch. The visualized proximal subclavian arteries are normal. Right carotid system: The right common carotid origin is widely patent. There is no common carotid or internal carotid artery dissection or aneurysm. No hemodynamically significant stenosis. Left carotid system: The left common carotid origin is widely patent. There is no common carotid or internal carotid artery dissection or aneurysm. No hemodynamically significant stenosis. Vertebral arteries: The vertebral system is codominant. Both vertebral artery origins are normal. Both vertebral arteries are normal to their confluence with the basilar artery. Skeleton: There is no bony spinal canal stenosis. No lytic or blastic lesions. Other neck: The nasopharynx is clear. The oropharynx and hypopharynx are normal. The epiglottis is normal. The supraglottic larynx, glottis and subglottic larynx are normal. No retropharyngeal collection. The parapharyngeal spaces are preserved. The parotid and submandibular glands are normal. No sialolithiasis or salivary ductal dilatation. The thyroid gland is normal. There is no cervical lymphadenopathy. Upper chest: No pneumothorax or pleural effusion. No nodules or masses. Review of the MIP images confirms the above findings CTA HEAD FINDINGS Anterior circulation: --Intracranial internal carotid arteries: Normal. --Anterior cerebral arteries: Normal. --Middle cerebral arteries: There is occlusion of the distal M1 segment of the right middle cerebral artery. There is intermediate collateralization in the right MCA territory. Normal left MCA. --Posterior communicating arteries: Absent bilaterally. Posterior circulation: --Posterior cerebral arteries: Normal. --Superior cerebellar arteries: Normal. --Basilar artery: Normal. --Anterior inferior cerebellar arteries: Normal. --Posterior inferior cerebellar arteries: Normal. Venous sinuses: As  permitted by contrast timing, patent. Anatomic variants: None Delayed phase: No parenchymal contrast enhancement. Review of the MIP images confirms the above findings. CT Brain Perfusion Findings: CBF (<30%) Volume: 35mL Perfusion (Tmax>6.0s) volume: 189mL Mismatch Volume: 113mL Infarction Location:Right MCA territory IMPRESSION: 1. Emergent large vessel occlusion of the distal M 1 segment of the right middle cerebral artery with intermediate distal collateralization. 2. Ischemic penumbra volume of 101 mL without core infarction by cerebral blood flow criterion. 3. No hemorrhage or mass effect. 4. No occlusion or flow-limiting stenosis of the carotid or vertebral arteries. Critical Value/emergent results were called by telephone at the time of interpretation on 03/20/2017 at 8:20 pm to Dr. Karma Greaser , who verbally acknowledged these results. Electronically Signed: By: Ulyses Jarred M.D. On: 03/20/2017 20:33   Ct Head Wo Contrast  Result Date: 03/21/2017 CLINICAL DATA:  Stroke, follow-up, history hypertension, colorectal cancer EXAM: CT HEAD WITHOUT CONTRAST TECHNIQUE: Contiguous axial images were obtained from the base of the skull through the vertex without intravenous contrast. Sagittal and coronal MPR images reconstructed from axial data set. COMPARISON:  03/20/2017 at 2213 hours FINDINGS: Brain: Normal ventricular morphology. Progressive low attenuation in RIGHT hemisphere consistent with evolving subacute RIGHT MCA territory infarct. Infarct extends to involve the RIGHT basal ganglia. No hemorrhagic transformation. Minimal RIGHT to LEFT midline shift. No new areas of hemorrhage or infarction. No extra-axial collections. Vascular: Question high  attenuation within a few small branches of the RIGHT middle cerebral artery. Skull: Intact, unremarkable Sinuses/Orbits: Clear Other: N/A IMPRESSION: Involving nonhemorrhagic RIGHT MCA territory infarct involving the lateral RIGHT frontal, parietal, and temporal lobes as  well as RIGHT basal ganglia. Minimal RIGHT to LEFT midline shift. No evidence of hemorrhagic transformation or new area of infarction. Electronically Signed   By: Lavonia Dana M.D.   On: 03/21/2017 15:26   Ct Head Wo Contrast  Result Date: 03/20/2017 CLINICAL DATA:  Altered level of consciousness. EXAM: CT HEAD WITHOUT CONTRAST TECHNIQUE: Contiguous axial images were obtained from the base of the skull through the vertex without intravenous contrast. COMPARISON:  None. FINDINGS: Brain: There is loss of gray-white matter distinction in the right posterior frontal and temporoparietal lobes with slight positive mass effect on the adjacent right lateral ventricle. No acute intracranial hemorrhage is noted. There is no hydrocephalus. Midline fourth ventricle and basal cisterns. No intra-axial mass nor extra-axial fluid is noted. Right basal ganglial hypodensities also present. Vascular: Hyperdense vessels noted along the expected location of the distal M1 and possibly M2 branches of the MCA. Skull: Negative for fracture or focal lesion. Sinuses/Orbits: No acute finding. Other: None IMPRESSION: 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 Hyperdense vessels off the distal right MCA compatible with thrombosed vessels. These results were called by telephone at the time of interpretation on 03/20/2017 at 6:52 pm to Dr. Hinda Kehr , who verbally acknowledged these results. Electronically Signed   By: Ashley Royalty M.D.   On: 03/20/2017 18:52   Ct Angio Neck W Or Wo Contrast  Addendum Date: 03/20/2017   ADDENDUM REPORT: 03/20/2017 21:18 ADDENDUM: This addendum is provided following telephone discussion with Dr. Roland Rack at 9:05 p.m. on and further comparison with the earlier performed noncontrast head CT. The noncontrast head CT shows extensive hypoattenuation throughout the right MCA distribution consistent with infarct.  ASPECTS is 6. The lack of core infarct by CBF criteria is artifactual due to luxury perfusion. Electronically Signed   By: Ulyses Jarred M.D.   On: 03/20/2017 21:18   Result Date: 03/20/2017 CLINICAL DATA:  Unresponsive patient.  Abnormal head CT. EXAM: CT ANGIOGRAPHY HEAD AND NECK CT PERFUSION BRAIN TECHNIQUE: Multidetector CT imaging of the head and neck was performed using the standard protocol during bolus administration of intravenous contrast. Multiplanar CT image reconstructions and MIPs were obtained to evaluate the vascular anatomy. Carotid stenosis measurements (when applicable) are obtained utilizing NASCET criteria, using the distal internal carotid diameter as the denominator. Multiphase CT imaging of the brain was performed following IV bolus contrast injection. Subsequent parametric perfusion maps were calculated using RAPID software. CONTRAST:  149mL ISOVUE-370 IOPAMIDOL (ISOVUE-370) INJECTION 76% COMPARISON:  Head CT 03/20/2017 FINDINGS: CTA NECK FINDINGS Aortic arch: There is no calcific atherosclerosis of the aortic arch. There is no aneurysm, dissection or hemodynamically significant stenosis of the visualized ascending aorta and aortic arch. Normal variant aortic arch branching pattern with the left vertebral artery arising independently from the aortic arch. The visualized proximal subclavian arteries are normal. Right carotid system: The right common carotid origin is widely patent. There is no common carotid or internal carotid artery dissection or aneurysm. No hemodynamically significant stenosis. Left carotid system: The left common carotid origin is widely patent. There is no common carotid or internal carotid artery dissection or aneurysm. No hemodynamically significant stenosis. Vertebral arteries: The vertebral system is codominant. Both vertebral artery origins  are normal. Both vertebral arteries are normal to their confluence with the basilar artery. Skeleton: There is no bony  spinal canal stenosis. No lytic or blastic lesions. Other neck: The nasopharynx is clear. The oropharynx and hypopharynx are normal. The epiglottis is normal. The supraglottic larynx, glottis and subglottic larynx are normal. No retropharyngeal collection. The parapharyngeal spaces are preserved. The parotid and submandibular glands are normal. No sialolithiasis or salivary ductal dilatation. The thyroid gland is normal. There is no cervical lymphadenopathy. Upper chest: No pneumothorax or pleural effusion. No nodules or masses. Review of the MIP images confirms the above findings CTA HEAD FINDINGS Anterior circulation: --Intracranial internal carotid arteries: Normal. --Anterior cerebral arteries: Normal. --Middle cerebral arteries: There is occlusion of the distal M1 segment of the right middle cerebral artery. There is intermediate collateralization in the right MCA territory. Normal left MCA. --Posterior communicating arteries: Absent bilaterally. Posterior circulation: --Posterior cerebral arteries: Normal. --Superior cerebellar arteries: Normal. --Basilar artery: Normal. --Anterior inferior cerebellar arteries: Normal. --Posterior inferior cerebellar arteries: Normal. Venous sinuses: As permitted by contrast timing, patent. Anatomic variants: None Delayed phase: No parenchymal contrast enhancement. Review of the MIP images confirms the above findings. CT Brain Perfusion Findings: CBF (<30%) Volume: 85mL Perfusion (Tmax>6.0s) volume: 152mL Mismatch Volume: 176mL Infarction Location:Right MCA territory IMPRESSION: 1. Emergent large vessel occlusion of the distal M 1 segment of the right middle cerebral artery with intermediate distal collateralization. 2. Ischemic penumbra volume of 101 mL without core infarction by cerebral blood flow criterion. 3. No hemorrhage or mass effect. 4. No occlusion or flow-limiting stenosis of the carotid or vertebral arteries. Critical Value/emergent results were called by telephone  at the time of interpretation on 03/20/2017 at 8:20 pm to Dr. Karma Greaser , who verbally acknowledged these results. Electronically Signed: By: Ulyses Jarred M.D. On: 03/20/2017 20:33   Mr Jodene Nam Head Wo Contrast  Result Date: 03/22/2017 CLINICAL DATA:  Right MCA infarct. EXAM: MRI HEAD WITHOUT CONTRAST MRA HEAD WITHOUT CONTRAST TECHNIQUE: Multiplanar, multiecho pulse sequences of the brain and surrounding structures were obtained without intravenous contrast. Angiographic images of the head were obtained using MRA technique without contrast. COMPARISON:  Head CT 03/21/2017 and CTA 03/20/2017 FINDINGS: MRI HEAD FINDINGS Brain: As seen on the recent CT, there is a large acute right MCA infarct involving the temporal greater than frontal and parietal lobes. The insula, external capsule, and right lentiform nucleus are also involved. The overall extent of the infarction is similar to the prior CT. There is associated cytotoxic edema with regional mass effect, sulcal effacement, and 3 mm of leftward midline shift, unchanged. No associated hemorrhage is identified. There is no ventriculomegaly. The basilar cisterns are patent. Scattered small foci of T2 hyperintensity in the left cerebral hemispheric white matter are nonspecific but compatible with mild chronic small vessel ischemic disease. Vascular: Major intracranial vascular flow voids are preserved. FLAIR hyperintensity in right MCA branch vessels in the sylvian fissure likely reflects reduced flow. Skull and upper cervical spine: Unremarkable bone marrow signal. Sinuses/Orbits: Unremarkable orbits. Paranasal sinuses and mastoid air cells are clear. Other: None. MRA HEAD FINDINGS The visualized distal vertebral arteries are widely patent to the basilar. PICA origins were not imaged. Right AICA and bilateral SCA is are grossly patent. Left AICA is not identified. The basilar artery is widely patent. Posterior communicating arteries are not identified and may be  diminutive or absent. PCAs are patent without evidence of significant stenosis. The internal carotid arteries are widely patent from skull base to carotid termini.  There is persistent complete or near complete occlusion of the distal right M1 segment at the bifurcation. No significant flow related enhancement is identified in the right MCA inferior division. Major right M2 superior division branch vessels appear patent but are narrowed proximally at the bifurcation. Left MCA and both ACAs are patent without evidence of proximal branch occlusion or significant stenosis. No aneurysm is identified. IMPRESSION: 1. Evolving large acute right MCA infarct without hemorrhage. Mild mass effect with 3 mm of leftward midline shift, unchanged. 2. Mild chronic small vessel ischemic disease. 3. Persistent complete or near complete distal right M1 occlusion at the MCA bifurcation. Patent proximal right M2 superior division branch vessels. No flow related enhancement in the right MCA inferior division which may reflect occlusion or collateralized slow flow below the resolution of MRA. Electronically Signed   By: Logan Bores M.D.   On: 03/22/2017 11:32   Mr Brain Wo Contrast  Result Date: 03/22/2017 CLINICAL DATA:  Right MCA infarct. EXAM: MRI HEAD WITHOUT CONTRAST MRA HEAD WITHOUT CONTRAST TECHNIQUE: Multiplanar, multiecho pulse sequences of the brain and surrounding structures were obtained without intravenous contrast. Angiographic images of the head were obtained using MRA technique without contrast. COMPARISON:  Head CT 03/21/2017 and CTA 03/20/2017 FINDINGS: MRI HEAD FINDINGS Brain: As seen on the recent CT, there is a large acute right MCA infarct involving the temporal greater than frontal and parietal lobes. The insula, external capsule, and right lentiform nucleus are also involved. The overall extent of the infarction is similar to the prior CT. There is associated cytotoxic edema with regional mass effect, sulcal  effacement, and 3 mm of leftward midline shift, unchanged. No associated hemorrhage is identified. There is no ventriculomegaly. The basilar cisterns are patent. Scattered small foci of T2 hyperintensity in the left cerebral hemispheric white matter are nonspecific but compatible with mild chronic small vessel ischemic disease. Vascular: Major intracranial vascular flow voids are preserved. FLAIR hyperintensity in right MCA branch vessels in the sylvian fissure likely reflects reduced flow. Skull and upper cervical spine: Unremarkable bone marrow signal. Sinuses/Orbits: Unremarkable orbits. Paranasal sinuses and mastoid air cells are clear. Other: None. MRA HEAD FINDINGS The visualized distal vertebral arteries are widely patent to the basilar. PICA origins were not imaged. Right AICA and bilateral SCA is are grossly patent. Left AICA is not identified. The basilar artery is widely patent. Posterior communicating arteries are not identified and may be diminutive or absent. PCAs are patent without evidence of significant stenosis. The internal carotid arteries are widely patent from skull base to carotid termini. There is persistent complete or near complete occlusion of the distal right M1 segment at the bifurcation. No significant flow related enhancement is identified in the right MCA inferior division. Major right M2 superior division branch vessels appear patent but are narrowed proximally at the bifurcation. Left MCA and both ACAs are patent without evidence of proximal branch occlusion or significant stenosis. No aneurysm is identified. IMPRESSION: 1. Evolving large acute right MCA infarct without hemorrhage. Mild mass effect with 3 mm of leftward midline shift, unchanged. 2. Mild chronic small vessel ischemic disease. 3. Persistent complete or near complete distal right M1 occlusion at the MCA bifurcation. Patent proximal right M2 superior division branch vessels. No flow related enhancement in the right MCA  inferior division which may reflect occlusion or collateralized slow flow below the resolution of MRA. Electronically Signed   By: Logan Bores M.D.   On: 03/22/2017 11:32   Ct Cerebral Perfusion W  Contrast  Result Date: 03/20/2017 CLINICAL DATA:  Weakness.  Right MCA distribution infarct. EXAM: CT PERFUSION BRAIN TECHNIQUE: Multiphase CT imaging of the brain was performed following IV bolus contrast injection. Subsequent parametric perfusion maps were calculated using RAPID software. CONTRAST:  40 mL Isovue 370 COMPARISON:  Head CT 03/20/2017 CT angiography and perfusion study 03/20/2017 FINDINGS: Review of source images and earlier noncontrast head CT in shows extensive hypoattenuation throughout much of the right MCA distribution. Enhancement within the demonstrated area of right MCA infarct is indicative of luxury perfusion. CT Brain Perfusion Findings: CBF (<30%) Volume: 0 mL. Perfusion (Tmax>6.0s) volume: 89 mLmL Mismatch Volume: 89 mL. Infarction Location:Right MCA distribution. The above cerebral blood flow and mismatch volume data are misleading, as the normal cerebral blood flow values are due to luxury perfusion in the distribution of the infarct. Most of the area of elevated Tmax corresponds to the hypodense infarcted tissue on the noncontrast CT, suggesting that this area is mostly core infarct rather than a large area of ischemic penumbra. IMPRESSION: Large right MCA distribution infarct with misleading CTP data secondary to luxury perfusion phenomenon. The area of elevated mean transit time largely corresponds to the infarcted area demonstrated on noncontrast CT and is mostly core infarct. Electronically Signed   By: Ulyses Jarred M.D.   On: 03/20/2017 22:49   Ct Cerebral Perfusion W Contrast  Addendum Date: 03/20/2017   ADDENDUM REPORT: 03/20/2017 21:18 ADDENDUM: This addendum is provided following telephone discussion with Dr. Roland Rack at 9:05 p.m. on and further comparison with  the earlier performed noncontrast head CT. The noncontrast head CT shows extensive hypoattenuation throughout the right MCA distribution consistent with infarct. ASPECTS is 6. The lack of core infarct by CBF criteria is artifactual due to luxury perfusion. Electronically Signed   By: Ulyses Jarred M.D.   On: 03/20/2017 21:18   Result Date: 03/20/2017 CLINICAL DATA:  Unresponsive patient.  Abnormal head CT. EXAM: CT ANGIOGRAPHY HEAD AND NECK CT PERFUSION BRAIN TECHNIQUE: Multidetector CT imaging of the head and neck was performed using the standard protocol during bolus administration of intravenous contrast. Multiplanar CT image reconstructions and MIPs were obtained to evaluate the vascular anatomy. Carotid stenosis measurements (when applicable) are obtained utilizing NASCET criteria, using the distal internal carotid diameter as the denominator. Multiphase CT imaging of the brain was performed following IV bolus contrast injection. Subsequent parametric perfusion maps were calculated using RAPID software. CONTRAST:  117mL ISOVUE-370 IOPAMIDOL (ISOVUE-370) INJECTION 76% COMPARISON:  Head CT 03/20/2017 FINDINGS: CTA NECK FINDINGS Aortic arch: There is no calcific atherosclerosis of the aortic arch. There is no aneurysm, dissection or hemodynamically significant stenosis of the visualized ascending aorta and aortic arch. Normal variant aortic arch branching pattern with the left vertebral artery arising independently from the aortic arch. The visualized proximal subclavian arteries are normal. Right carotid system: The right common carotid origin is widely patent. There is no common carotid or internal carotid artery dissection or aneurysm. No hemodynamically significant stenosis. Left carotid system: The left common carotid origin is widely patent. There is no common carotid or internal carotid artery dissection or aneurysm. No hemodynamically significant stenosis. Vertebral arteries: The vertebral system is  codominant. Both vertebral artery origins are normal. Both vertebral arteries are normal to their confluence with the basilar artery. Skeleton: There is no bony spinal canal stenosis. No lytic or blastic lesions. Other neck: The nasopharynx is clear. The oropharynx and hypopharynx are normal. The epiglottis is normal. The supraglottic larynx, glottis and subglottic  larynx are normal. No retropharyngeal collection. The parapharyngeal spaces are preserved. The parotid and submandibular glands are normal. No sialolithiasis or salivary ductal dilatation. The thyroid gland is normal. There is no cervical lymphadenopathy. Upper chest: No pneumothorax or pleural effusion. No nodules or masses. Review of the MIP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

## 2017-03-24 ENCOUNTER — Inpatient Hospital Stay (HOSPITAL_COMMUNITY): Payer: Medicaid Other | Admitting: Anesthesiology

## 2017-03-24 ENCOUNTER — Inpatient Hospital Stay (HOSPITAL_COMMUNITY)
Admission: RE | Admit: 2017-03-24 | Discharge: 2017-04-07 | DRG: 057 | Disposition: A | Discharge status: Home Health | Payer: Medicaid Other | Source: Intra-hospital | Attending: Physical Medicine & Rehabilitation | Admitting: Physical Medicine & Rehabilitation

## 2017-03-24 ENCOUNTER — Inpatient Hospital Stay (HOSPITAL_COMMUNITY): Payer: Medicaid Other

## 2017-03-24 ENCOUNTER — Encounter (HOSPITAL_COMMUNITY): Payer: Self-pay | Admitting: *Deleted

## 2017-03-24 ENCOUNTER — Encounter (HOSPITAL_COMMUNITY)
Admission: AD | Disposition: A | Discharge status: Rehabilitation Facility | Payer: Self-pay | Source: Other Acute Inpatient Hospital | Attending: Neurology

## 2017-03-24 DIAGNOSIS — M792 Neuralgia and neuritis, unspecified: Secondary | ICD-10-CM | POA: Diagnosis not present

## 2017-03-24 DIAGNOSIS — Z885 Allergy status to narcotic agent status: Secondary | ICD-10-CM

## 2017-03-24 DIAGNOSIS — R0989 Other specified symptoms and signs involving the circulatory and respiratory systems: Secondary | ICD-10-CM

## 2017-03-24 DIAGNOSIS — H547 Unspecified visual loss: Secondary | ICD-10-CM | POA: Diagnosis present

## 2017-03-24 DIAGNOSIS — I69354 Hemiplegia and hemiparesis following cerebral infarction affecting left non-dominant side: Principal | ICD-10-CM

## 2017-03-24 DIAGNOSIS — G441 Vascular headache, not elsewhere classified: Secondary | ICD-10-CM | POA: Diagnosis not present

## 2017-03-24 DIAGNOSIS — E876 Hypokalemia: Secondary | ICD-10-CM | POA: Diagnosis present

## 2017-03-24 DIAGNOSIS — E785 Hyperlipidemia, unspecified: Secondary | ICD-10-CM | POA: Diagnosis present

## 2017-03-24 DIAGNOSIS — R1310 Dysphagia, unspecified: Secondary | ICD-10-CM | POA: Diagnosis present

## 2017-03-24 DIAGNOSIS — R001 Bradycardia, unspecified: Secondary | ICD-10-CM | POA: Diagnosis present

## 2017-03-24 DIAGNOSIS — Z9221 Personal history of antineoplastic chemotherapy: Secondary | ICD-10-CM

## 2017-03-24 DIAGNOSIS — Z79899 Other long term (current) drug therapy: Secondary | ICD-10-CM

## 2017-03-24 DIAGNOSIS — I69319 Unspecified symptoms and signs involving cognitive functions following cerebral infarction: Secondary | ICD-10-CM | POA: Diagnosis not present

## 2017-03-24 DIAGNOSIS — I63511 Cerebral infarction due to unspecified occlusion or stenosis of right middle cerebral artery: Secondary | ICD-10-CM | POA: Diagnosis present

## 2017-03-24 DIAGNOSIS — I10 Essential (primary) hypertension: Secondary | ICD-10-CM | POA: Diagnosis present

## 2017-03-24 DIAGNOSIS — R7989 Other specified abnormal findings of blood chemistry: Secondary | ICD-10-CM

## 2017-03-24 DIAGNOSIS — Z85048 Personal history of other malignant neoplasm of rectum, rectosigmoid junction, and anus: Secondary | ICD-10-CM | POA: Diagnosis not present

## 2017-03-24 DIAGNOSIS — Z902 Acquired absence of lung [part of]: Secondary | ICD-10-CM | POA: Diagnosis not present

## 2017-03-24 DIAGNOSIS — I69391 Dysphagia following cerebral infarction: Secondary | ICD-10-CM

## 2017-03-24 DIAGNOSIS — I6389 Other cerebral infarction: Secondary | ICD-10-CM

## 2017-03-24 DIAGNOSIS — I69320 Aphasia following cerebral infarction: Secondary | ICD-10-CM | POA: Diagnosis not present

## 2017-03-24 DIAGNOSIS — K909 Intestinal malabsorption, unspecified: Secondary | ICD-10-CM | POA: Diagnosis not present

## 2017-03-24 DIAGNOSIS — H548 Legal blindness, as defined in USA: Secondary | ICD-10-CM

## 2017-03-24 DIAGNOSIS — R4701 Aphasia: Secondary | ICD-10-CM

## 2017-03-24 DIAGNOSIS — R195 Other fecal abnormalities: Secondary | ICD-10-CM

## 2017-03-24 HISTORY — PX: TEE WITHOUT CARDIOVERSION: SHX5443

## 2017-03-24 LAB — ANTINUCLEAR ANTIBODIES, IFA: ANTINUCLEAR ANTIBODIES, IFA: NEGATIVE

## 2017-03-24 LAB — CBC
HCT: 40.7 % (ref 39.0–52.0)
Hemoglobin: 13.6 g/dL (ref 13.0–17.0)
MCH: 29.1 pg (ref 26.0–34.0)
MCHC: 33.4 g/dL (ref 30.0–36.0)
MCV: 87 fL (ref 78.0–100.0)
Platelets: 173 10*3/uL (ref 150–400)
RBC: 4.68 MIL/uL (ref 4.22–5.81)
RDW: 14.6 % (ref 11.5–15.5)
WBC: 5.6 10*3/uL (ref 4.0–10.5)

## 2017-03-24 LAB — CARDIOLIPIN ANTIBODIES, IGG, IGM, IGA
Anticardiolipin IgG: <9 GPL U/mL (ref 0–14)
Anticardiolipin IgM: 13 MPL U/mL — ABNORMAL HIGH (ref 0–12)

## 2017-03-24 LAB — BETA-2-GLYCOPROTEIN I ABS, IGG/M/A
Beta-2 Glyco I IgG: <9 GPI IgG units (ref 0–20)
Beta-2-Glycoprotein I IgA: <9 GPI IgA units (ref 0–25)
Beta-2-Glycoprotein I IgM: <9 GPI IgM units (ref 0–32)

## 2017-03-24 LAB — BASIC METABOLIC PANEL
ANION GAP: 11 (ref 5–15)
BUN: 9 mg/dL (ref 6–20)
CALCIUM: 8.7 mg/dL — AB (ref 8.9–10.3)
CO2: 24 mmol/L (ref 22–32)
Chloride: 105 mmol/L (ref 101–111)
Creatinine, Ser: 0.99 mg/dL (ref 0.61–1.24)
GLUCOSE: 96 mg/dL (ref 65–99)
Potassium: 3.6 mmol/L (ref 3.5–5.1)
Sodium: 140 mmol/L (ref 135–145)

## 2017-03-24 LAB — GLUCOSE, CAPILLARY: Glucose-Capillary: 96 mg/dL (ref 65–99)

## 2017-03-24 LAB — PHOSPHORUS: Phosphorus: 3.3 mg/dL (ref 2.5–4.6)

## 2017-03-24 LAB — MAGNESIUM: Magnesium: 2.1 mg/dL (ref 1.7–2.4)

## 2017-03-24 SURGERY — ECHOCARDIOGRAM, TRANSESOPHAGEAL
Anesthesia: Monitor Anesthesia Care

## 2017-03-24 MED ORDER — POTASSIUM CHLORIDE CRYS ER 20 MEQ PO TBCR
40.0000 meq | EXTENDED_RELEASE_TABLET | Freq: Two times a day (BID) | ORAL | Status: DC
  Administered 2017-03-24 – 2017-04-07 (×28): 40 meq via ORAL
  Filled 2017-03-24 (×28): qty 2

## 2017-03-24 MED ORDER — SORBITOL 70 % SOLN: 30.0000 mL | Freq: Every day | Status: DC | PRN

## 2017-03-24 MED ORDER — ASPIRIN 300 MG RE SUPP
300.0000 mg | Freq: Every day | RECTAL | Status: DC
  Filled 2017-03-24 (×2): qty 1

## 2017-03-24 MED ORDER — LOPERAMIDE HCL 1 MG/5ML PO LIQD
4.0000 mg | Freq: Four times a day (QID) | ORAL | Status: DC | PRN
  Administered 2017-03-24: 4 mg via ORAL
  Filled 2017-03-24 (×2): qty 20

## 2017-03-24 MED ORDER — ONDANSETRON HCL 4 MG/2ML IJ SOLN: 4.0000 mg | Freq: Four times a day (QID) | INTRAMUSCULAR | Status: DC | PRN

## 2017-03-24 MED ORDER — METOPROLOL TARTRATE 12.5 MG HALF TABLET
12.5000 mg | ORAL_TABLET | Freq: Two times a day (BID) | ORAL | Status: DC
  Administered 2017-03-24 – 2017-04-02 (×17): 12.5 mg via ORAL
  Filled 2017-03-24 (×18): qty 1

## 2017-03-24 MED ORDER — ENOXAPARIN SODIUM 40 MG/0.4ML ~~LOC~~ SOLN: 40.0000 mg | SUBCUTANEOUS | Status: DC

## 2017-03-24 MED ORDER — BUTALBITAL-APAP-CAFFEINE 50-325-40 MG PO TABS: 1.0000 | ORAL_TABLET | Freq: Three times a day (TID) | ORAL | Status: DC | PRN

## 2017-03-24 MED ORDER — GABAPENTIN 300 MG PO CAPS
300.0000 mg | ORAL_CAPSULE | Freq: Three times a day (TID) | ORAL | Status: DC
  Administered 2017-03-24: 300 mg via ORAL
  Filled 2017-03-24 (×2): qty 1

## 2017-03-24 MED ORDER — LIDOCAINE HCL (CARDIAC) 20 MG/ML IV SOLN
INTRAVENOUS | Status: DC | PRN
  Administered 2017-03-24: 100 mg via INTRAVENOUS

## 2017-03-24 MED ORDER — ATORVASTATIN CALCIUM 80 MG PO TABS
80.0000 mg | ORAL_TABLET | Freq: Every day | ORAL | Status: DC
  Administered 2017-03-25 – 2017-04-06 (×12): 80 mg via ORAL
  Filled 2017-03-24 (×14): qty 1

## 2017-03-24 MED ORDER — PROPOFOL 500 MG/50ML IV EMUL
INTRAVENOUS | Status: DC | PRN
  Administered 2017-03-24: 200 ug/kg/min via INTRAVENOUS

## 2017-03-24 MED ORDER — ONDANSETRON HCL 4 MG PO TABS: 4.0000 mg | ORAL_TABLET | Freq: Four times a day (QID) | ORAL | Status: DC | PRN

## 2017-03-24 MED ORDER — ENOXAPARIN SODIUM 40 MG/0.4ML ~~LOC~~ SOLN
40.0000 mg | SUBCUTANEOUS | Status: DC
  Administered 2017-03-25 – 2017-04-06 (×13): 40 mg via SUBCUTANEOUS
  Filled 2017-03-24 (×14): qty 0.4

## 2017-03-24 MED ORDER — ASPIRIN EC 325 MG PO TBEC
325.0000 mg | DELAYED_RELEASE_TABLET | Freq: Every day | ORAL | Status: DC
  Administered 2017-03-25 – 2017-04-07 (×14): 325 mg via ORAL
  Filled 2017-03-24 (×14): qty 1

## 2017-03-24 NOTE — Anesthesia Postprocedure Evaluation (Signed)
Anesthesia Post Note  Patient: ARBY DAHIR  Procedure(s) Performed: TRANSESOPHAGEAL ECHOCARDIOGRAM (TEE) (N/A )     Anesthesia Type: MAC Level of consciousness: awake and alert Pain management: pain level controlled Vital Signs Assessment: post-procedure vital signs reviewed and stable Respiratory status: spontaneous breathing, nonlabored ventilation, respiratory function stable and patient connected to nasal cannula oxygen Cardiovascular status: stable and blood pressure returned to baseline Postop Assessment: no apparent nausea or vomiting Anesthetic complications: no    Last Vitals:  Vitals:   03/24/17 1350 03/24/17 1400  BP: 125/79 (!) 137/93  Pulse: 61 72  Resp: 18 (!) 21  Temp:    SpO2: 96% 99%    Last Pain:  Vitals:   03/24/17 1339  TempSrc: Oral  PainSc:                  Edman Lipsey,[name] TERRILL

## 2017-03-24 NOTE — Transfer of Care (Signed)
Immediate Anesthesia Transfer of Care Note  Patient: [name] [name]  Procedure(s) Performed: TRANSESOPHAGEAL ECHOCARDIOGRAM (TEE) (N/A )  Patient Location: PACU  Anesthesia Type:MAC  Level of Consciousness: sedated  Airway & Oxygen Therapy: Patient Spontanous Breathing and Patient connected to nasal cannula oxygen  Post-op Assessment: Report given to RN and Post -op Vital signs reviewed and stable  Post vital signs: Reviewed and stable  Last Vitals:  Vitals:   03/24/17 0915 03/24/17 1230  BP: 135/86 (!) 165/83  Pulse: (!) 58 (!) 51  Resp: 16 19  Temp: 36.7 C 36.7 C  SpO2: 99% 98%    Last Pain:  Vitals:   03/24/17 1230  TempSrc: Oral  PainSc:          Complications: No apparent anesthesia complications

## 2017-03-24 NOTE — Progress Notes (Signed)
Physical Therapy Treatment Patient Details Name: [name] [name] MRN: [id number] DOB: 25-Feb-1965 Today's Date: 03/24/2017    History of Present Illness 52 y.o. male with a history of blindness who presents with difficulty with speech.  In ED reported right weakness.  CT revealed RIGHT MCA territory infarct with slight left weakness. PMHx: HTN, HLD, colon CA, colostomy    PT Comments    Pt sleeping soundly on arrival. Awoken easily. Pt participated in bed mobility and transfer bed to recliner. Pt continues to demonstrate expressive/receptive aphasia. Difficulty following commands. Safety impacted by these noted deficits as well as balance deficits and blindness. Pt is scheduled to undergo TEE at 1300 then transfer to CIR.    Follow Up Recommendations  CIR;Supervision/Assistance - 24 hour     Equipment Recommendations  Other (comment)(defer to CIR)    Recommendations for Other Services       Precautions / Restrictions Precautions Precautions: Fall;Other (comment) Precaution Comments: blind, aphasia (receptive and expressive)    Mobility  Bed Mobility Overal bed mobility: Needs Assistance Bed Mobility: Rolling Rolling: Min assist Sidelying to sit: Min assist       General bed mobility comments: max verbal cues for sequencing and to stay on task  Transfers Overall transfer level: Needs assistance Equipment used: 1 person hand held assist Transfers: Sit to/from [facility] Sit to Stand: Min assist;+2 physical assistance Stand pivot transfers: +2 physical assistance;Mod assist       General transfer comment: difficulty sequencing, continuous verbal cues, assist to maintain balance  Ambulation/Gait             General Gait Details: unable due t safety, difficulty follow commands   Stairs            Wheelchair Mobility    Modified Rankin (Stroke Patients Only) Modified Rankin (Stroke Patients Only) Pre-Morbid Rankin Score: No  symptoms Modified Rankin: Severe disability     Balance Overall balance assessment: Needs assistance Sitting-balance support: No upper extremity supported;Feet supported Sitting balance-Leahy Scale: Fair     Standing balance support: Single extremity supported;During functional activity Standing balance-Leahy Scale: Poor                              Cognition Arousal/Alertness: Lethargic Behavior During Therapy: Flat affect Overall Cognitive Status: Difficult to assess Area of Impairment: Following commands;Memory;Attention;Safety/judgement;Awareness;Problem solving                   Current Attention Level: Sustained Memory: Decreased short-term memory Following Commands: Follows one step commands inconsistently Safety/Judgement: Decreased awareness of deficits;Decreased awareness of safety Awareness: Intellectual Problem Solving: Slow processing;Requires verbal cues;Requires tactile cues;Difficulty sequencing;Decreased initiation General Comments: receptive/expressive aphasia      Exercises      General Comments        Pertinent Vitals/Pain Pain Assessment: Faces Faces Pain Scale: No hurt    Home Living                      Prior Function            PT Goals (current goals can now be found in the care plan section) Acute Rehab PT Goals Patient Stated Goal: return home PT Goal Formulation: With family Time For Goal Achievement: 04/04/17 Potential to Achieve Goals: Fair Progress towards PT goals: Progressing toward goals    Frequency    Min 4X/week      PT Plan Current plan  remains appropriate    Co-evaluation              AM-PAC PT "6 Clicks" Daily Activity  Outcome Measure  Difficulty turning over in bed (including adjusting bedclothes, sheets and blankets)?: Unable Difficulty moving from lying on back to sitting on the side of the bed? : Unable Difficulty sitting down on and standing up from a chair with arms  (e.g., wheelchair, bedside commode, etc,.)?: Unable Help needed moving to and from a bed to chair (including a wheelchair)?: A Lot Help needed walking in hospital room?: A Lot Help needed climbing 3-5 steps with a railing? : Total 6 Click Score: 8    End of Session Equipment Utilized During Treatment: Gait belt Activity Tolerance: Patient tolerated treatment well Patient left: in chair;with call bell/phone within reach;with chair alarm set;with family/visitor present Nurse Communication: Mobility status PT Visit Diagnosis: Other abnormalities of gait and mobility (R26.89);Unsteadiness on feet (R26.81)     Time: 8453-6468 PT Time Calculation (min) (ACUTE ONLY): 18 min  Charges:  $Therapeutic Activity: 8-22 mins                    G Codes:       Lorrin Goodell, PT  Office # [phone] Pager [phone]    Lorriane Shire 03/24/2017, 9:46 AM

## 2017-03-24 NOTE — CV Procedure (Signed)
TRANSESOPHAGEAL ECHOCARDIOGRAM (TEE) NOTE  INDICATIONS: stroke  PROCEDURE:   Informed consent was obtained prior to the procedure. The risks, benefits and alternatives for the procedure were discussed and the patient comprehended these risks.  Risks include, but are not limited to, cough, sore throat, vomiting, nausea, somnolence, esophageal and stomach trauma or perforation, bleeding, low blood pressure, aspiration, pneumonia, infection, trauma to the teeth and death.    After a procedural time-out, the patient was given propofol per anesthesia for sedation.  The patient's heart rate, blood pressure, and oxygen saturation are monitored continuously during the procedure.The oropharynx was anesthetized with topical cetacaine.  The transesophageal probe was inserted in the esophagus and stomach without difficulty and multiple views were obtained.  The patient was kept under observation until the patient left the procedure room.The patient left the procedure room in stable condition.   Agitated microbubble saline contrast was administered.  COMPLICATIONS:    There were no immediate complications.  Findings:  1. LEFT VENTRICLE: The left ventricular wall thickness is mildly increased.  The left ventricular cavity is normal in size. Wall motion is normal.  LVEF is 60-65%.  2. RIGHT VENTRICLE:  The right ventricle is normal in structure and function without any thrombus or masses.    3. LEFT ATRIUM:  The left atrium is moderately dilated  in size without any thrombus or masses.  There is not spontaneous echo contrast ("smoke") in the left atrium consistent with a low flow state.  4. LEFT ATRIAL APPENDAGE:  The left atrial appendage is free of any thrombus or masses. The appendage has single lobes. Pulse doppler indicates high flow in the appendage.  5. ATRIAL SEPTUM:  The atrial septum appears intact and is free of thrombus and/or masses.  There is no evidence for interatrial shunting by  color doppler and saline microbubble.  6. RIGHT ATRIUM:  The right atrium is normal in size and function without any thrombus or masses. There is a central catheter noted in the right atrium.  7. MITRAL VALVE:  The mitral valve is normal in structure and function with trivial regurgitation.  There were no vegetations or stenosis.  8. AORTIC VALVE:  The aortic valve is trileaflet, normal in structure and function with no regurgitation.  There were no vegetations or stenosis  9. TRICUSPID VALVE:  The tricuspid valve is normal in structure and function with trivial regurgitation.  There were no vegetations or stenosis  10.  PULMONIC VALVE:  The pulmonic valve is normal in structure and function with no regurgitation.  There were no vegetations or stenosis.   11. AORTIC ARCH, ASCENDING AND DESCENDING AORTA:  There was no Ron Parker et. Al, 1992) atherosclerosis of the ascending aorta, aortic arch, or proximal descending aorta.  12. PULMONARY VEINS: Anomalous pulmonary venous return was not noted.  13. PERICARDIUM: The pericardium appeared normal and non-thickened.  There is no pericardial effusion.  IMPRESSION:   1. No LAA thrombus 2. Negative for PFO 3. Central catheter noted in the right atrium without thrombus 4. LVEF 60-65%  RECOMMENDATIONS:    1.  No obvious cause of stroke from a cardiac source.  Time Spent Directly with the Patient:  45 minutes   [name], MD, [facility] [place], [place] Director of the Advanced Lipid Disorders &  Cardiovascular Risk Reduction Clinic Diplomate of the American Board of Clinical Lipidology Attending Cardiologist  Direct Dial: [phone]  Fax: [phone]  Website:  [email or web address]  Nadean Corwin  Hilty 03/24/2017, 1:37 PM

## 2017-03-24 NOTE — Anesthesia Procedure Notes (Signed)
Procedure Name: MAC Date/Time: 03/24/2017 1:28 PM Performed by: Lieutenant Diego, CRNA Pre-anesthesia Checklist: Patient identified, Emergency Drugs available, Suction available, Patient being monitored and Timeout performed Patient Re-evaluated:Patient Re-evaluated prior to induction Oxygen Delivery Method: Simple face mask Preoxygenation: Pre-oxygenation with 100% oxygen

## 2017-03-24 NOTE — Progress Notes (Signed)
Patient complaining of loose stools.Family at bedside confirming patient has had several loose stools today.Patient requesting immodium. Patient stated," This is embarrassing.I don't want to keep doing this."Patient has been incontinent of bowels.Text paged Dr.Aroor. New order received for one time dose of immodium.

## 2017-03-24 NOTE — Plan of Care (Signed)
  Progressing Education: Knowledge of disease or condition will improve 03/24/2017 1630 - Progressing by Roxana Lai, Vertell Limber, RN Knowledge of secondary prevention will improve 03/24/2017 1630 - Progressing by Aarini Slee, Vertell Limber, RN Knowledge of patient specific risk factors addressed and post discharge goals established will improve 03/24/2017 1630 - Progressing by Jaimen Melone, Vertell Limber, RN Coping: Will verbalize positive feelings about self 03/24/2017 1630 - Progressing by Evalee Jefferson, RN Will identify appropriate support needs 03/24/2017 1630 - Progressing by Tykee Heideman, Vertell Limber, RN Health Behavior/Discharge Planning: Ability to manage health-related needs will improve 03/24/2017 1630 - Progressing by Evalee Jefferson, RN Self-Care: Ability to participate in self-care as condition permits will improve 03/24/2017 1630 - Progressing by Sharad Vaneaton, Vertell Limber, RN Verbalization of feelings and concerns over difficulty with self-care will improve 03/24/2017 1630 - Progressing by Mardy Lucier, Vertell Limber, RN Ability to communicate needs accurately will improve 03/24/2017 1630 - Progressing by Caulder Wehner, Vertell Limber, RN Ischemic Stroke/TIA Tissue Perfusion: Complications of ischemic stroke/TIA will be minimized 03/24/2017 1630 - Progressing by Evalee Jefferson, RN Education: Knowledge of General Education information will improve 03/24/2017 1630 - Progressing by Evalee Jefferson, RN Health Behavior/Discharge Planning: Ability to manage health-related needs will improve 03/24/2017 1630 - Progressing by Krysten Veronica, Vertell Limber, RN Clinical Measurements: Ability to maintain clinical measurements within normal limits will improve 03/24/2017 1630 - Progressing by Evalee Jefferson, RN Diagnostic test results will improve 03/24/2017 1630 - Progressing by Evalee Jefferson, RN Cardiovascular complication will be avoided 03/24/2017 1630 - Progressing by  Evalee Jefferson, RN Activity: Risk for activity intolerance will decrease 03/24/2017 1630 - Progressing by Evalee Jefferson, RN Nutrition: Adequate nutrition will be maintained 03/24/2017 1630 - Progressing by Evalee Jefferson, RN Coping: Level of anxiety will decrease 03/24/2017 1630 - Progressing by Evalee Jefferson, RN Pain Managment: General experience of comfort will improve 03/24/2017 1630 - Progressing by Evalee Jefferson, RN Skin Integrity: Risk for impaired skin integrity will decrease 03/24/2017 1630 - Progressing by Seydina Holliman, Vertell Limber, RN
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
## 2017-03-24 NOTE — H&P (Signed)
   INTERVAL PROCEDURE H&P  History and Physical Interval Note:  03/24/2017 1:08 PM  [name] [name] has presented today for their planned procedure. The various methods of treatment have been discussed with the patient and family. After consideration of risks, benefits and other options for treatment, the patient has consented to the procedure.  The patients' outpatient history has been reviewed, patient examined, and no change in status from most recent office note within the past 30 days. I have reviewed the patients' chart and labs and will proceed as planned. Questions were answered to the patient's satisfaction.   [name] Casino, MD, [facility] [place] Director of the Advanced Lipid Disorders &  Cardiovascular Risk Reduction Clinic Diplomate of the American Board of Clinical Lipidology Attending Cardiologist  Direct Dial: [phone]  Fax: [phone]  Website:  [name] 03/24/2017, 1:08 PM
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
## 2017-03-24 NOTE — Progress Notes (Signed)
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 by cerebral blood flow criterion. 3. No hemorrhage or mass effect. 4. No occlusion or flow-limiting stenosis of the carotid or vertebral arteries. Critical Value/emergent results were called by telephone at the time of interpretation on 03/20/2017 at 8:20 pm to Dr. Karma Greaser , who verbally acknowledged these results. Electronically Signed: By: Ulyses Jarred M.D. On: 03/20/2017 20:33   Ct Head Code Stroke Wo Contrast  Result Date: 03/20/2017 CLINICAL DATA:  Code stroke. EXAM: CT HEAD WITHOUT CONTRAST TECHNIQUE: Contiguous axial images were obtained from the base of the skull through the vertex without intravenous contrast. COMPARISON:  03/20/2017 CT head FINDINGS: Brain: Right MCA distribution infarct with areas of hypoattenuation in the lateral right frontal lobe, parietal lobe, and superior temporal lobe as well as the right insula. In comparison with the prior CT of the head there is a mild increase in edema and local mass effect. Stable minimal right-to-left midline shift and partial  effacement of right lateral ventricle. No interval hemorrhage or stroke identified. Vascular: Persistent contrast is present within the vascular system. Skull: Normal. Negative for fracture or focal lesion. Sinuses/Orbits: No acute finding. Other: None. ASPECTS [facility] Stroke Program Early CT Score) - Ganglionic level infarction (caudate, lentiform nuclei, internal capsule, insula, M1-M3 cortex): 4 - Supraganglionic infarction (M4-M6 cortex): 1 Total score (0-10 with 10 being normal): 5 IMPRESSION: 1. Right MCA distribution late acute/subacute infarction. Mild interval increase in edema. Stable minimal right-to-left midline shift. No acute hemorrhage. 2. ASPECTS is 5 These results were called by telephone at the time of interpretation on 03/20/2017 at 10:16 pm to Dr. Roland Rack , who verbally acknowledged these results. Electronically Signed   By: Kristine Garbe M.D.   On: 03/20/2017 22:16     Assessment/Plan: Diagnosis: right MCA infarct 1. Does the need for close, 24 hr/day medical supervision in concert with the patient's rehab needs make it unreasonable for this patient to be served in a less intensive setting? Yes 2. 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 Due to bladder management, bowel management, safety, skin/wound care, disease management, medication administration and patient education, does the patient require 24 hr/day rehab nursing? Yes 4. Does the patient require coordinated care of a physician, rehab nurse, PT (1-2 hrs/day, 5 days/week), OT (1-2 hrs/day, 5 days/week) and SLP (1-2 hrs/day, 5 days/week) to address physical and functional deficits in the context of the above  medical diagnosis(es)? Yes Addressing deficits in the following areas: balance, endurance, locomotion, strength, transferring, bowel/bladder control, bathing, dressing, feeding, grooming, toileting, cognition, swallowing and psychosocial  support 5. Can the patient actively participate in an intensive therapy program of at least 3 hrs of therapy per day at least 5 days per week? Yes 6. The potential for patient to make measurable gains while on inpatient rehab is good 7. Anticipated functional outcomes upon discharge from inpatient rehab are supervision and min assist  with PT, supervision and min assist with OT, supervision and min assist with SLP. 8. Estimated rehab length of stay to reach the above functional goals is: 20-25 days 9. Anticipated D/C setting: Home 10. Anticipated post D/C treatments: [place] therapy 11. Overall Rehab/Functional Prognosis: excellent  RECOMMENDATIONS: This patient's condition is appropriate for continued rehabilitative care in the following setting: CIR Patient has agreed to participate in recommended program. Potentially Note that insurance prior authorization may be required for reimbursement for recommended care.  Comment: Rehab Admissions Coordinator to follow up.  Thanks,  Meredith Staggers, MD, Mellody Drown    Bary Leriche, PA-C 03/21/2017

## 2017-03-24 NOTE — Progress Notes (Signed)
  Echocardiogram 2D Echocardiogram has been performed.  Jennette Dubin 03/24/2017, 1:50 PM

## 2017-03-24 NOTE — Progress Notes (Deleted)
Patient complaining of loose stools .Family at bedside to confirming patient has had several loose stools today .Patient requesting immodium.Patient stated,"This is embarrassing.I don't want to keep doing this." Text paged DR.Aroor.New order received for one time dose of immodium.
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
## 2017-03-24 NOTE — Anesthesia Preprocedure Evaluation (Signed)
Anesthesia Evaluation  Patient identified by MRN, date of birth, ID band Patient awake    Reviewed: Allergy & Precautions, NPO status , Patient's Chart, lab work & pertinent test results  History of Anesthesia Complications Negative for: history of anesthetic complications  Airway Mallampati: II   Neck ROM: Full    Dental no notable dental hx.    Pulmonary neg pulmonary ROS,    breath sounds clear to auscultation       Cardiovascular hypertension,  Rhythm:Regular Rate:Normal     Neuro/Psych blind CVA    GI/Hepatic   Endo/Other    Renal/GU Renal disease     Musculoskeletal   Abdominal (+) + obese,   Peds  Hematology  (+) anemia ,   Anesthesia Other Findings   Reproductive/Obstetrics                             Anesthesia Physical Anesthesia Plan  ASA: III  Anesthesia Plan: MAC   Post-op Pain Management:    Induction:   PONV Risk Score and Plan: Treatment may vary due to age or medical condition  Airway Management Planned: Nasal Cannula and Natural Airway  Additional Equipment:   Intra-op Plan:   Post-operative Plan:   Informed Consent: I have reviewed the patients History and Physical, chart, labs and discussed the procedure including the risks, benefits and alternatives for the proposed anesthesia with the patient or authorized representative who has indicated his/her understanding and acceptance.     Plan Discussed with: CRNA  Anesthesia Plan Comments:         Anesthesia Quick Evaluation
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
## 2017-03-24 NOTE — Progress Notes (Signed)
Inpt rehab bed is available today for pt. Pt and Mom at bedside and in agreement. I contacted Dr. Erlinda Hong and will arrange admit after LOOP insertion today. TEE complete. RN CM is aware. [phone]

## 2017-03-24 NOTE — Care Management Note (Signed)
Case Management Note  Patient Details  Name: [name] [name] MRN: [id number] Date of Birth: 05/24/1965  Subjective/Objective:                    Action/Plan: Pt discharging to CIR. No further needs per CM.   Expected Discharge Date:  03/24/17               Expected Discharge Plan:  [place]  In-House Referral:     Discharge planning Services  CM Consult  Post Acute Care Choice:    Choice offered to:     DME Arranged:    DME Agency:     HH Arranged:    HH Agency:     Status of Service:  Completed, signed off  If discussed at [employer], dates discussed:    Additional Comments:  Pollie Friar, RN 03/24/2017, 4:44 PM

## 2017-03-25 ENCOUNTER — Inpatient Hospital Stay (HOSPITAL_COMMUNITY): Payer: Medicaid Other | Admitting: Occupational Therapy

## 2017-03-25 ENCOUNTER — Encounter (HOSPITAL_COMMUNITY): Payer: Self-pay

## 2017-03-25 ENCOUNTER — Inpatient Hospital Stay (HOSPITAL_COMMUNITY): Payer: Medicaid Other | Admitting: Physical Therapy

## 2017-03-25 ENCOUNTER — Inpatient Hospital Stay (HOSPITAL_COMMUNITY): Payer: Medicaid Other

## 2017-03-25 DIAGNOSIS — R197 Diarrhea, unspecified: Secondary | ICD-10-CM

## 2017-03-25 DIAGNOSIS — K909 Intestinal malabsorption, unspecified: Secondary | ICD-10-CM

## 2017-03-25 DIAGNOSIS — M792 Neuralgia and neuritis, unspecified: Secondary | ICD-10-CM

## 2017-03-25 LAB — GLUCOSE, CAPILLARY
GLUCOSE-CAPILLARY: 83 mg/dL (ref 65–99)
Glucose-Capillary: 76 mg/dL (ref 65–99)
Glucose-Capillary: 92 mg/dL (ref 65–99)

## 2017-03-25 MED ORDER — LOPERAMIDE HCL 2 MG PO CAPS
2.0000 mg | ORAL_CAPSULE | ORAL | Status: DC | PRN
  Administered 2017-03-25 – 2017-03-28 (×3): 2 mg via ORAL
  Filled 2017-03-25 (×3): qty 1

## 2017-03-25 MED ORDER — ACETAMINOPHEN 325 MG PO TABS
650.0000 mg | ORAL_TABLET | Freq: Four times a day (QID) | ORAL | Status: DC | PRN
  Administered 2017-03-25 – 2017-04-06 (×10): 650 mg via ORAL
  Filled 2017-03-25 (×10): qty 2

## 2017-03-25 MED ORDER — ALPRAZOLAM 0.25 MG PO TABS: 0.2500 mg | ORAL_TABLET | Freq: Three times a day (TID) | ORAL | Status: DC | PRN

## 2017-03-25 MED ORDER — SACCHAROMYCES BOULARDII 250 MG PO CAPS
250.0000 mg | ORAL_CAPSULE | Freq: Two times a day (BID) | ORAL | Status: DC
  Administered 2017-03-25 – 2017-04-07 (×27): 250 mg via ORAL
  Filled 2017-03-25 (×27): qty 1

## 2017-03-25 MED ORDER — METHOCARBAMOL 500 MG PO TABS
500.0000 mg | ORAL_TABLET | Freq: Three times a day (TID) | ORAL | Status: DC | PRN
  Administered 2017-04-01 – 2017-04-03 (×2): 500 mg via ORAL
  Filled 2017-03-25 (×2): qty 1
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
## 2017-03-25 NOTE — Plan of Care (Signed)
  Not Progressing RH SAFETY RH STG ADHERE TO SAFETY PRECAUTIONS W/ASSISTANCE/DEVICE Description STG Adhere to Safety Precautions With min Assistance/Device.  03/25/2017 1149 - Not Progressing by Stanton Kissoon, Renato Gails, RN  Max assist at this time- very impulsive

## 2017-03-25 NOTE — Progress Notes (Signed)
Telesitter placed with patient for safety. Patient is impulsive and not directable at times. Mother is in room and very agreeable to sitter. Report called to sitter. Will continue to monitor. Charge nurse aware.

## 2017-03-25 NOTE — Plan of Care (Signed)
LTGs established 03/25/17

## 2017-03-25 NOTE — Progress Notes (Signed)
Several attempt to obtain admission documentation information with resistance from patient and girlfriend,states he is t tired to deal with it. Girlfriend states she is not comfortable discussing any information and the mother need to be contact, will attempt to notify mother

## 2017-03-25 NOTE — Plan of Care (Signed)
Long term goals set on 03/25/17
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
## 2017-03-25 NOTE — Progress Notes (Signed)
[facility] PHYSICAL MEDICINE & REHABILITATION     PROGRESS NOTE    Subjective/Complaints: Patient had a fair night.  Still having frequent/loose bowel movements.  Having pain in the feet at times but does not want to be on gabapentin as he is taking it before and it and it makes him drowsy.  Prefers trying Tylenol instead..  ROS: pt denies nausea, vomiting,   cough, shortness of breath or chest pain   Objective: Vital Signs: Blood pressure 129/75, pulse 70, temperature 98 F (36.7 C), temperature source Oral, resp. rate 18, SpO2 100 %. No results found. Recent Labs    03/23/17 0208 03/24/17 0736  WBC 5.7 5.6  HGB 13.2 13.6  HCT 40.0 40.7  PLT 179 173   Recent Labs    03/23/17 0208 03/24/17 0736  NA 140 140  K 3.3* 3.6  CL 105 105  GLUCOSE 102* 96  BUN 7 9  CREATININE 1.08 0.99  CALCIUM 8.5* 8.7*   CBG (last 3)  Recent Labs    03/24/17 2109 03/25/17 0634  GLUCAP 96 83    Wt Readings from Last 3 Encounters:  03/24/17 100 kg (220 lb 7.4 oz)  03/20/17 99.8 kg (220 lb)  01/26/17 101.1 kg (222 lb 14.4 oz)    Physical Exam:  Constitutional: He appears well-developed and well-nourished. He appears lethargic. He is easily aroused. No distress.  HENT:  Head: Normocephalic and atraumatic.  Eyes:  PERRL, with confrontation patient cannot identify objects in any fields this morning Neck: Normal range of motion. Neck supple.  Cardiovascular: RRR without murmur. No JVD   Respiratory: CTA Bilaterally without wheezes or rales. Normal effort  Abdominal. Soft nontender good bowel sounds without distention  Skin. Warm and dry  Neurological. Pt in bed. Fairly alert. Exp>rec aphasia--does produce words and automatic answers at times.  Apraxic   Left central 7 and tongue deviation.  Engages more with right arm than left although he was able to move all 4 for me today and consistently so Psych: flat     Assessment/Plan: 1. Functional deficits  secondary to right MCA  infarct which require 3+ hours per day of interdisciplinary therapy in a comprehensive inpatient rehab setting. Physiatrist is providing close team supervision and 24 hour management of active medical problems listed below. Physiatrist and rehab team continue to assess barriers to discharge/monitor patient progress toward functional and medical goals.  Function:  Bathing Bathing position      Bathing parts      Bathing assist        Upper Body Dressing/Undressing Upper body dressing                    Upper body assist        Lower Body Dressing/Undressing Lower body dressing                                  Lower body assist        Toileting Toileting          Toileting assist     Transfers Chair/bed transfer             Locomotion Ambulation           Wheelchair          Cognition Comprehension    Expression    Social Interaction Social Interaction assist level: Interacts appropriately 90% of the time - Needs  monitoring or encouragement for participation or interaction.  Problem Solving    Memory     Medical Problem List and Plan:  1. 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 Status post loop recorder  -Beginning therapies today 2. DVT Prophylaxis/Anticoagulation: Subcutaneous Lovenox. Monitor for any bleeding episodes. Venous Doppler studies negative  3. Headaches/ ain Management:   Fioricet as needed    -Added Tylenol every 6 hours as needed.   -Stop gabapentin.  Consider resuming at low dose depending on pain symptoms.  Discussed with patient and family today 4. Mood: LCSW to follow for evaluation and support.  5. Neuropsych: This patient is not capable of making decisions on his own behalf.  6. Skin/Wound Care: routine pressure relief measures.  7. Fluids/Electrolytes/Nutrition: Monitor I/O.  8. HTN: Lopressor 12.5 mg twice a day  9. Dyslipidemia: Lipitor  10. History of rectal cancer with resection  and chemotherapy. Follow-up outpatient added Imodium for loose stools   - Added probiotic as well to help with stool consistency 11. Blindness.  Minimal vision at all   LOS (Days) 1 A FACE TO FACE EVALUATION WAS PERFORMED  Meredith Staggers, MD 03/25/2017 8:38 AM
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
## 2017-03-26 ENCOUNTER — Inpatient Hospital Stay (HOSPITAL_COMMUNITY): Payer: Medicaid Other | Admitting: Occupational Therapy

## 2017-03-26 DIAGNOSIS — I10 Essential (primary) hypertension: Secondary | ICD-10-CM

## 2017-03-26 LAB — GLUCOSE, CAPILLARY: GLUCOSE-CAPILLARY: 87 mg/dL (ref 65–99)

## 2017-03-26 NOTE — Progress Notes (Signed)
[facility] PHYSICAL MEDICINE & REHABILITATION     PROGRESS NOTE    Subjective/Complaints: Patient lying in bed.  States he had a mild headache.  Remains impulsive.  Tele-sitter now in room.  Nurse states that he only wants to urinate when standing. Stool more formed  ROS: Limited due to cognitive/behavioral   Objective: Vital Signs: Blood pressure 120/79, pulse 63, temperature 98.3 F (36.8 C), temperature source Oral, resp. rate 18, SpO2 96 %. No results found. Recent Labs    03/24/17 0736  WBC 5.6  HGB 13.6  HCT 40.7  PLT 173   Recent Labs    03/24/17 0736  NA 140  K 3.6  CL 105  GLUCOSE 96  BUN 9  CREATININE 0.99  CALCIUM 8.7*   CBG (last 3)  Recent Labs    03/25/17 1131 03/25/17 2021 03/26/17 0623  GLUCAP 76 92 87    Wt Readings from Last 3 Encounters:  03/24/17 100 kg (220 lb 7.4 oz)  03/20/17 99.8 kg (220 lb)  01/26/17 101.1 kg (222 lb 14.4 oz)    Physical Exam:  Constitutional: He appears well-developed and well-nourished. He appears lethargic. He is easily aroused. No distress.  HENT:  Head: Normocephalic and atraumatic.  Eyes:  PERRL, with confrontation patient cannot identify objects in any fields this morning Neck: Normal range of motion. Neck supple.  Cardiovascular: RRR without murmur. No JVD    Respiratory: CTA Bilaterally without wheezes or rales. Normal effort  Abdominal. Soft nontender good bowel sounds without distention  Skin. Warm and dry  Neurological. Pt in bed. Fairly alert. Exp>rec aphasia, verbal apraxia.   Left central 7 and tongue deviation.  Engages more with right arm than left although he was able to move all 4 for me today and consistently so Psych: flat but cooperative    Assessment/Plan: 1. Functional deficits  secondary to right MCA infarct which require 3+ hours per day of interdisciplinary therapy in a comprehensive inpatient rehab setting. Physiatrist is providing close team supervision and 24 hour management of  active medical problems listed below. Physiatrist and rehab team continue to assess barriers to discharge/monitor patient progress toward functional and medical goals.  Function:  Bathing Bathing position      Bathing parts Body parts bathed by patient: Right arm, Left arm, Chest, Abdomen, Front perineal area, Buttocks, Right upper leg, Left upper leg Body parts bathed by helper: Right lower leg, Left lower leg, Back  Bathing assist Assist Level: (Mod A)      Upper Body Dressing/Undressing Upper body dressing   What is the patient wearing?: Pull over shirt/dress     Pull over shirt/dress - Perfomed by patient: Put head through opening, Pull shirt over trunk Pull over shirt/dress - Perfomed by helper: Thread/unthread right sleeve, Thread/unthread left sleeve        Upper body assist        Lower Body Dressing/Undressing Lower body dressing   What is the patient wearing?: Pants, Non-skid slipper socks, Ted Hose, Shoes     Pants- Performed by patient: Thread/unthread right pants leg, Thread/unthread left pants leg, Pull pants up/down     Non-skid slipper socks- Performed by helper: Don/doff right sock, Don/doff left sock       Shoes - Performed by helper: Don/doff right shoe, Don/doff left shoe, Fasten right, Fasten left       TED Hose - Performed by helper: Don/doff right TED hose, Don/doff left TED hose  Lower body assist Assist for lower  body dressing: (Mod A)      Toileting Toileting          Toileting assist     Transfers Chair/bed transfer   Chair/bed transfer method: Stand pivot Chair/bed transfer assist level: 2 helpers Chair/bed transfer assistive device: Other(Hand held)     Locomotion Ambulation     Max distance: (4 steps in room) Assist level: 2 helpers   Oceanographer activity did not occur: Safety/medical concerns(Agitation)        Cognition Comprehension Comprehension assist level: Understands basic 25 - 49% of the time/  requires cueing 50 - 75% of the time  Expression Expression assist level: Expresses basis less than 25% of the time/requires cueing >75% of the time.  Social Interaction Social Interaction assist level: Interacts appropriately 25 - 49% of time - Needs frequent redirection.  Problem Solving Problem solving assist level: Solves basic less than 25% of the time - needs direction nearly all the time or does not effectively solve problems and may need a restraint for safety  Memory Memory assist level: Recognizes or recalls less than 25% of the time/requires cueing greater than 75% of the time   Medical Problem List and Plan:  1. 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 Status post loop recorder  -Beginning therapies today 2. DVT Prophylaxis/Anticoagulation: Subcutaneous Lovenox. Monitor for any bleeding episodes. Venous Doppler studies negative  3. Headaches/ ain Management:   Fioricet as needed    -continue Tylenol every 6 hours as needed.   -held gabapentin.  Consider resuming at low dose depending on pain symptoms.   4. Mood: LCSW to follow for evaluation and support.    -likely some behavioral factors related to presentation 5. Neuropsych: This patient is not capable of making decisions on his own behalf.  6. Skin/Wound Care: routine pressure relief measures.  7. Fluids/Electrolytes/Nutrition: Monitor I/O.  8. HTN: Lopressor 12.5 mg twice a day  9. Dyslipidemia: Lipitor  10. History of rectal cancer with resection and chemotherapy. Follow-up outpatient added Imodium for loose stools   - Added probiotic as well to help with stool consistency   -Stools more formed yesterday 11. Blindness.  Limited vision if any   LOS (Days) 2 A FACE TO FACE EVALUATION WAS PERFORMED  Meredith Staggers, MD 03/26/2017 8:39 AM

## 2017-03-26 NOTE — Progress Notes (Signed)
Occupational Therapy Session Note  Patient Details  Name: NUH LIPTON MRN: [id number] Date of Birth: 06-14-1965  Today's Date: 03/26/2017 OT Individual Time: 0901-1000 OT Individual Time Calculation (min): 59 min   Short Term Goals: Week 1:  OT Short Term Goal 1 (Week 1): Pt will complete UB dressing with Min A OT Short Term Goal 2 (Week 1): Pt will initiate LB dressing completion when handed necessary items with min vcs  OT Short Term Goal 3 (Week 1): Pt will complete bathing with Min A  Skilled Therapeutic Interventions/Progress Updates:    Pt greeted supine in bed without clothes on. Mother present. He ambulated with RW and Mod A and max multimodal cues for locating shower seat in bathroom (on Lt side). He tended to veer Rt and required physical assist to safely manage walker. Pt bathing at sit<stand level, using tactile cues for initiation, awareness, and attention. Interchanging UE use with Lt hand with min vcs. Pt interacting with therapist, verbalizing "shower," "cold," "stand" and when completing pericare,"toilet" before having a BM on seat. Pt repeating the word "toilet" and attempting to leave shower. Pts mother Stanton Kidney assisted OT with increasing safety while pt stepped over towards toilet with Mod A with tactile cues for using wall and grab bars. Pt continuing to void bowels on toilet, and initiated hygiene completion using Lt hand. OT assisted with thoroughness while he stood with RW. Then he ambulated back to room with Mod A, OT steering RW and giving simple commands such as "forward" and "right turn". Pt veering Lt while exiting bathroom. When backing up to bed with RW, he lifted a trash can from floor, verbalizing "strong." He completed dressing EOB and bedlevel due to fatigue. Pt requiring max multimodal cues for sustained attention to dressing tasks, basic problem solving, and clothing orientation. He appeared to respond better with more frequent cuing than to wait, as he'd forget  what was asked. Pt boosted himself up in bed with instruction, and was left with mother present and bed alarm set.   Therapy Documentation Precautions:  Precautions Precautions: Fall, Other (comment) Precaution Comments: (Blind, global aphasia) Restrictions Weight Bearing Restrictions: No Vital Signs: Therapy Vitals Pulse Rate: 69 Resp: 18 BP: 119/76 Patient Position (if appropriate): Lying Oxygen Therapy SpO2: 98 % O2 Device: Not Delivered Pain: No c/o pain during tx    ADL: ADL ADL Comments: Please see functional navigator for ADL status     See Function Navigator for Current Functional Status.   Therapy/Group: Individual Therapy  Joletta Manner A Arilynn Blakeney 03/26/2017, 4:08 PM

## 2017-03-27 ENCOUNTER — Inpatient Hospital Stay (HOSPITAL_COMMUNITY): Payer: Medicaid Other

## 2017-03-27 ENCOUNTER — Inpatient Hospital Stay (HOSPITAL_COMMUNITY): Payer: Medicaid Other | Admitting: Speech Pathology

## 2017-03-27 ENCOUNTER — Inpatient Hospital Stay (HOSPITAL_COMMUNITY): Payer: Medicaid Other | Admitting: Occupational Therapy

## 2017-03-27 ENCOUNTER — Encounter (HOSPITAL_COMMUNITY): Payer: Self-pay | Admitting: Internal Medicine

## 2017-03-27 DIAGNOSIS — H547 Unspecified visual loss: Secondary | ICD-10-CM

## 2017-03-27 DIAGNOSIS — I10 Essential (primary) hypertension: Secondary | ICD-10-CM

## 2017-03-27 DIAGNOSIS — H548 Legal blindness, as defined in USA: Secondary | ICD-10-CM

## 2017-03-27 DIAGNOSIS — G441 Vascular headache, not elsewhere classified: Secondary | ICD-10-CM

## 2017-03-27 DIAGNOSIS — R4701 Aphasia: Secondary | ICD-10-CM

## 2017-03-27 LAB — CBC WITH DIFFERENTIAL/PLATELET
Basophils Absolute: 0 10*3/uL (ref 0.0–0.1)
Basophils Relative: 0 %
Eosinophils Absolute: 0.2 10*3/uL (ref 0.0–0.7)
Eosinophils Relative: 5 %
HEMATOCRIT: 42.9 % (ref 39.0–52.0)
HEMOGLOBIN: 14.3 g/dL (ref 13.0–17.0)
Lymphocytes Relative: 22 %
Lymphs Abs: 1 10*3/uL (ref 0.7–4.0)
MCH: 29 pg (ref 26.0–34.0)
MCHC: 33.3 g/dL (ref 30.0–36.0)
MCV: 87 fL (ref 78.0–100.0)
MONO ABS: 0.3 10*3/uL (ref 0.1–1.0)
MONOS PCT: 6 %
NEUTROS PCT: 67 %
Neutro Abs: 3.1 10*3/uL (ref 1.7–7.7)
Platelets: 196 10*3/uL (ref 150–400)
RBC: 4.93 MIL/uL (ref 4.22–5.81)
RDW: 14.8 % (ref 11.5–15.5)
WBC: 4.7 10*3/uL (ref 4.0–10.5)

## 2017-03-27 LAB — COMPREHENSIVE METABOLIC PANEL
ALK PHOS: 76 U/L (ref 38–126)
ALT: 25 U/L (ref 17–63)
ANION GAP: 9 (ref 5–15)
AST: 27 U/L (ref 15–41)
Albumin: 3.7 g/dL (ref 3.5–5.0)
BILIRUBIN TOTAL: 0.5 mg/dL (ref 0.3–1.2)
BUN: 12 mg/dL (ref 6–20)
CALCIUM: 9.1 mg/dL (ref 8.9–10.3)
CO2: 23 mmol/L (ref 22–32)
Chloride: 107 mmol/L (ref 101–111)
Creatinine, Ser: 1.02 mg/dL (ref 0.61–1.24)
Glucose, Bld: 102 mg/dL — ABNORMAL HIGH (ref 65–99)
Potassium: 4.3 mmol/L (ref 3.5–5.1)
Sodium: 139 mmol/L (ref 135–145)
TOTAL PROTEIN: 7.5 g/dL (ref 6.5–8.1)

## 2017-03-27 NOTE — Plan of Care (Signed)
  Consults RH STROKE PATIENT EDUCATION Description See Patient Education module for education specifics  03/27/2017 0529 - Not Progressing by Etheleen Nicks, RN   RH BOWEL ELIMINATION RH STG MANAGE BOWEL WITH ASSISTANCE Description STG Manage Bowel with min Assistance.  03/27/2017 0529 - Not Progressing by Etheleen Nicks, RN RH STG MANAGE BOWEL W/MEDICATION W/ASSISTANCE Description STG Manage Bowel with Medication with min Assistance.  03/27/2017 0529 - Not Progressing by Etheleen Nicks, RN   RH BLADDER ELIMINATION RH STG MANAGE BLADDER WITH ASSISTANCE Description STG Manage Bladder With min Assistance  03/27/2017 0529 - Not Progressing by Etheleen Nicks, RN RH STG MANAGE BLADDER WITH EQUIPMENT WITH ASSISTANCE Description STG Manage Bladder With Equipment With min  Assistance  03/27/2017 0529 - Not Progressing by Etheleen Nicks, RN   RH SKIN INTEGRITY RH STG SKIN FREE OF INFECTION/BREAKDOWN 03/27/2017 0529 - Not Progressing by Etheleen Nicks, RN   RH SAFETY RH STG ADHERE TO SAFETY PRECAUTIONS W/ASSISTANCE/DEVICE Description STG Adhere to Safety Precautions With min Assistance/Device.  03/27/2017 1505 - Not Progressing by Etheleen Nicks, RN   Pt requires max to total assist, telesitter for safety.
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
## 2017-03-27 NOTE — Progress Notes (Signed)
[facility] PHYSICAL MEDICINE & REHABILITATION     PROGRESS NOTE    Subjective/Complaints: Pt seen lying in bed this AM. No reported issues overnight.  ROS: Limited due to cognition  Objective: Vital Signs: Blood pressure (!) 157/85, pulse (!) 49, temperature 97.9 F (36.6 C), temperature source Oral, resp. rate 18, SpO2 99 %. No results found. No results for input(s): WBC, HGB, HCT, PLT in the last 72 hours. No results for input(s): NA, K, CL, GLUCOSE, BUN, CREATININE, CALCIUM in the last 72 hours.  Invalid input(s): CO CBG (last 3)  Recent Labs    03/25/17 1131 03/25/17 2021 03/26/17 0623  GLUCAP 76 92 87    Wt Readings from Last 3 Encounters:  03/24/17 100 kg (220 lb 7.4 oz)  03/20/17 99.8 kg (220 lb)  01/26/17 101.1 kg (222 lb 14.4 oz)    Physical Exam:  Constitutional: He appears well-developed and well-nourished. No acute distress.  HENT: Normocephalic and atraumatic.  Eyes: No discharge. Cardiovascular: RRR. No JVD    Respiratory: CTA Bilaterally. Normal effort  Abdominal. +bowel sounds. Non distented Musc: No edema, no tenderness. Neurological. Alert Global, Exp>rec aphasia, verbal apraxia.    Motor: Limited due to participation, but moving all extremities spontaneously Skin. Warm and dry. Intact. Psych: Unable to assess due to mentation  Assessment/Plan: 1. Functional deficits  secondary to right MCA infarct which require 3+ hours per day of interdisciplinary therapy in a comprehensive inpatient rehab setting. Physiatrist is providing close team supervision and 24 hour management of active medical problems listed below. Physiatrist and rehab team continue to assess barriers to discharge/monitor patient progress toward functional and medical goals.  Function:  Bathing Bathing position Bathing activity did not occur: N/A Position: Shower  Bathing parts Body parts bathed by patient: Right arm, Left arm, Chest, Abdomen, Front perineal area, Buttocks,  Right upper leg, Left upper leg Body parts bathed by helper: Right lower leg, Left lower leg, Back  Bathing assist Assist Level: (Mod A)      Upper Body Dressing/Undressing Upper body dressing   What is the patient wearing?: Pull over shirt/dress     Pull over shirt/dress - Perfomed by patient: Put head through opening, Pull shirt over trunk Pull over shirt/dress - Perfomed by helper: Thread/unthread right sleeve, Thread/unthread left sleeve        Upper body assist Assist Level: (Mod A)      Lower Body Dressing/Undressing Lower body dressing   What is the patient wearing?: Pants, Non-skid slipper socks, [employer], Shoes, Scientist, water quality - Performed by patient: Thread/unthread right underwear leg, Thread/unthread left underwear leg, Pull underwear up/down   Pants- Performed by patient: Thread/unthread right pants leg, Thread/unthread left pants leg, Pull pants up/down     Non-skid slipper socks- Performed by helper: Don/doff right sock, Don/doff left sock       Shoes - Performed by helper: Don/doff right shoe, Don/doff left shoe, Fasten right, Fasten left       TED Hose - Performed by helper: Don/doff right TED hose, Don/doff left TED hose  Lower body assist Assist for lower body dressing: (Mod A)      Toileting Toileting   Toileting steps completed by patient: Adjust clothing prior to toileting, Adjust clothing after toileting Toileting steps completed by helper: Adjust clothing prior to toileting, Performs perineal hygiene, Adjust clothing after toileting Toileting Assistive Devices: Toilet aid  Toileting assist Assist level: Set up/obtain supplies, More than reasonable time, Two helpers, Supervision or verbal cues  Transfers Chair/bed transfer   Chair/bed transfer method: Stand pivot Chair/bed transfer assist level: 2 helpers Chair/bed transfer assistive device: Other(Hand held)     Data processing manager     Max distance: (4 steps in room) Assist level:  2 helpers   Oceanographer activity did not occur: Safety/medical concerns(Agitation)        Cognition Comprehension Comprehension assist level: Understands basic 25 - 49% of the time/ requires cueing 50 - 75% of the time  Expression Expression assist level: Expresses basic 25 - 49% of the time/requires cueing 50 - 75% of the time. Uses single words/gestures.  Social Interaction Social Interaction assist level: Interacts appropriately 25 - 49% of time - Needs frequent redirection.  Problem Solving Problem solving assist level: Solves basic 25 - 49% of the time - needs direction more than half the time to initiate, plan or complete simple activities  Memory Memory assist level: Recognizes or recalls less than 25% of the time/requires cueing greater than 75% of the time   Medical Problem List and Plan:  1. 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 Status post loop recorder    Cont CIR   Notes reviewed, images reviewed, labs reviewed 2. DVT Prophylaxis/Anticoagulation: Subcutaneous Lovenox. Monitor for any bleeding episodes.    Venous Doppler studies negative  3. Headaches/pain Management:   Fioricet as needed    -continue Tylenol every 6 hours as needed. 4. Mood: LCSW to follow for evaluation and support.  5. Neuropsych: This patient is not capable of making decisions on his own behalf.  6. Skin/Wound Care: routine pressure relief measures.  7. Fluids/Electrolytes/Nutrition: Monitor I/O.    BMP within acceptable range on 2/15, labs pending for today 8. HTN: Lopressor 12.5 mg twice a day    Elevated this AM, otherwise, relatively controlled 9. Dyslipidemia: Lipitor  10. History of rectal cancer with resection and chemotherapy.   Follow-up outpatient    Added Imodium for loose stools   Added probiotic as well to help with stool consistency  11. Blindness.  Limited vision if any   LOS (Days) 3 A FACE TO FACE EVALUATION WAS PERFORMED  Bobak Oguinn Lorie Phenix,  MD 03/27/2017 8:13 AM

## 2017-03-27 NOTE — Care Management Note (Addendum)
[facility] Individual Statement of Services  Patient Name:  [name] [name]  Date:  03/27/2017  Welcome to the [facility].  Our goal is to provide you with an individualized program based on your diagnosis and situation, designed to meet your specific needs.  With this comprehensive rehabilitation program, you will be expected to participate in at least 3 hours of rehabilitation therapies Monday-Friday, with modified therapy programming on the weekends.  Your rehabilitation program will include the following services:  Physical Therapy (PT), Occupational Therapy (OT), Speech Therapy (ST), 24 hour per day rehabilitation nursing, Therapeutic Recreaction (TR), Neuropsychology, Case Management (Social Worker), Rehabilitation Medicine, Nutrition Services and Pharmacy Services  Weekly team conferences will be held on Wednesday to discuss your progress.  Your Social Worker will talk with you frequently to get your input and to update you on team discussions.  Team conferences with you and your family in attendance may also be held.  Expected length of stay: 18-21 days  Overall anticipated outcome: supervision-min assist level  Depending on your progress and recovery, your program may change. Your Social Worker will coordinate services and will keep you informed of any changes. Your Social Worker's name and contact numbers are listed  below.  The following services may also be recommended but are not provided by the [facility]:    [place] will be made to provide these services after discharge if needed.  Arrangements include referral to agencies that provide these services.  Your insurance has been verified to be:  medicaid Your primary doctor is:  Orland Mustard  Pertinent information will be shared with your doctor and your insurance  company.  Social Worker:  Ovidio Kin, [place] or ([phone]  Information discussed with and copy given to patient by: Elease Hashimoto, 03/27/2017, 12:51 PM

## 2017-03-27 NOTE — Progress Notes (Signed)
Physical Therapy Session Note  Patient Details  Name: [name] [name] MRN: [id number] Date of Birth: 17-Aug-1965  Today's Date: 03/27/2017 PT Individual Time: 8341-9622 PT Individual Time Calculation (min): 55 min   Short Term Goals: Week 1:  PT Short Term Goal 1 (Week 1): Pt will perform bed to chair transfer with min A x 1 and LRAD in 1 min or less. PT Short Term Goal 2 (Week 1): Pt will ambulate 20 ft with LRAD and min A. PT Short Term Goal 3 (Week 1): Pt will perform supine to sit and return with supervision.  Skilled Therapeutic Interventions/Progress Updates:    Pt supine in bed upon PT arrival, agreeable to therapy tx and denies pain. Pt with difficulty following commands throughout session secondary to aphasia, requiring max multimodal cues for sequencing. Pt transferred from supine>sitting EOB with min assist, therapist providing cues for pt to sit up on L side of the bed however pt continued to move towards the R side of the bed. Therapist donned shoes total assist. Pt performed sit<>stand with min assist and use of RW, pt unable to follow commands to move towards w/c for transfer and therapist had to move w/c behind pt for safety. Pt standing without AD, despite therapist telling the pt to continue sitting. Pt attempting to turn and sit on table in the room, requiring max verbal cues and directing from therapist to sit back in w/c. Pt transported from room>gym in w/c total assist. Pt ambulated x 40 ft with RW and +2 for w/c follow for safety, pt requiring min physical assist and max verbal/tactile cues to prevent running into obstacles. Pt performed sit<>stand at high table with min assist, therapist provided pt with blocks and asked pt to name shapes based on touch, pt able to recall 4/5. Pt propelled w/c x 50 ft using B LEs, max verbal cues for sequencing, tactile cues to attend to L LE, min assist to avoid hitting obstacles. Pt transferred from w/c>bed with +2 assist for safety, stand  pivot. Pt left supine in bed with needs in reach and bed alarm set.   Therapy Documentation Precautions:  Precautions Precautions: Fall, Other (comment) Precaution Comments: (Blind, global aphasia) Restrictions Weight Bearing Restrictions: No   See Function Navigator for Current Functional Status.   Therapy/Group: Individual Therapy  Netta Corrigan, PT, DPT 03/27/2017, 2:42 PM

## 2017-03-27 NOTE — Progress Notes (Signed)
Speech Language Pathology Daily Session Note  Patient Details  Name: [name] [name] MRN: [id number] Date of Birth: 05-Jan-1966  Today's Date: 03/27/2017 SLP Individual Time: 1100-1200 SLP Individual Time Calculation (min): 60 min  Short Term Goals: Week 1: SLP Short Term Goal 1 (Week 1): Pt will verbally express wants/needs with in 50% of opportunties with Mod A multimodal cues.  SLP Short Term Goal 2 (Week 1): Pt will answer basic yes/no questions with in 50% of opportunties with Mod A multimdoal cues. SLP Short Term Goal 3 (Week 1): Pt will follow 1 step commands with Mod A multimodal cues.  SLP Short Term Goal 4 (Week 1): Pt will verbally express rote/automatic verbal sequences with Max A multimodal cues.  SLP Short Term Goal 5 (Week 1): Pt will utilize call bell to request assistance with Max A multimodal cues.  SLP Short Term Goal 6 (Week 1): Pt will consume regular diet given hand over hand assistance without overt s/s aspiration.  Skilled Therapeutic Interventions: Skilled treatment session focused dysphagia and communication goals. SLP facilitated session by providing skilled observation of pt consuming regular diet items with thin liquids via straw. SLP placed food item in pt's hand and he could feed himself with no cues and appropriately small bites and complete oral clearing. Recommend upgrading pt's diet to regular with request for finger foods to aid in compensating for visual deficits. Education provided to nursing that pt would benefit from hand over hand to locate items on tray but during this session pt doesn't demonstrate any oropharyngeal dysphagia.   SLP further facilitated session by providing Max A cues to answer simple yes/no questions related to himself to achieve ~ 25% accuracy. Pt also required Max A cues (holding object and function of object stated) to name common objects with ~ 75% accuracy. Pt with frequent off topic responses d/t significant receptive impairments. Pt  unable to comprehend unison singing of rote songs. Pt was left laying in bed with all needs within reach.      Function:  Eating Eating   Modified Consistency Diet: No Eating Assist Level: Set up assist for;Hand over hand assist(d/t vision)       Helper Brings Food to Mouth: Every scoop   Cognition Comprehension Comprehension assist level: Understands basic 25 - 49% of the time/ requires cueing 50 - 75% of the time  Expression   Expression assist level: Expresses basic 25 - 49% of the time/requires cueing 50 - 75% of the time. Uses single words/gestures.  Social Interaction Social Interaction assist level: Interacts appropriately 25 - 49% of time - Needs frequent redirection.  Problem Solving Problem solving assist level: Solves basic 25 - 49% of the time - needs direction more than half the time to initiate, plan or complete simple activities(question if d/t vision and new enviornment)  Memory Memory assist level: Recognizes or recalls less than 25% of the time/requires cueing greater than 75% of the time    Pain    Therapy/Group: Individual Therapy  [name] 03/27/2017, 2:59 PM

## 2017-03-27 NOTE — IPOC Note (Addendum)
Overall Plan of Care [facility]) Patient Details Name: [name] [name] MRN: [id number] DOB: 07-Jun-1965  Admitting Diagnosis: Right middle cerebral artery stroke [facility])  Hospital Problems: Principal Problem:   Right middle cerebral artery stroke [facility]) Active Problems:   Hypokalemia   Vascular headache   Benign essential HTN   Global aphasia   Blind     Functional Problem List: Nursing Bladder, Pain, Safety  PT Balance, Behavior, Endurance, Motor, Pain, Perception, Safety  OT Balance, Pain, Behavior, Perception, Cognition, Safety, Sensory, Endurance, Motor, Vision  SLP Behavior  TR         Basic ADL's: OT Grooming, Eating, Bathing, Dressing, Toileting     Advanced  ADL's: OT Simple Meal Preparation     Transfers: PT Bed Mobility, Bed to Chair, Car, Furniture, Floor  OT Toilet, Tub/Shower     Locomotion: PT Ambulation, Stairs     Additional Impairments: OT Fuctional Use of Upper Extremity  SLP Swallowing, Communication comprehension, expression    TR      Anticipated Outcomes Item Anticipated Outcome  Self Feeding Supervision  Swallowing  Mod I   Basic self-care  Supervision-Min A  Toileting  Supervision    Bathroom Transfers Supervision-Min A  Bowel/Bladder  pt will be continent of bowel and bladder during admission  Transfers  Supervision to min A  Locomotion  Min A  Communication  Min A  Cognition     Pain  Pt will be pain free or pain less than 3 during admission  Safety/Judgment  Pt will be free from falls and adhere to the safety plan   Therapy Plan: PT Intensity: Minimum of 1-2 x/day ,45 to 90 minutes PT Frequency: 5 out of 7 days PT Duration Estimated Length of Stay: 18-21 days OT Intensity: Minimum of 1-2 x/day, 45 to 90 minutes OT Frequency: 5 out of 7 days OT Duration/Estimated Length of Stay: 2.5-3 weeks SLP Intensity: Minumum of 1-2 x/day, 30 to 90 minutes SLP Frequency: 3 to 5 out of 7 days SLP Duration/Estimated Length of Stay: 3  weeks     Team Interventions: Nursing Interventions Patient/Family Education, Pain Management, Dysphagia/Aspiration Precaution Training, Disease Management/Prevention  PT interventions Ambulation/gait training, Training and development officer, Cognitive remediation/compensation, Academic librarian, Engineer, drilling, Disease management/prevention, Discharge planning, Functional mobility training, Neuromuscular re-education, Pain management, Patient/family education, Stair training, UE/LE Coordination activities, UE/LE Strength taining/ROM, Therapeutic Exercise, Therapeutic Activities, Psychosocial support  OT Interventions Balance/vestibular training, Discharge planning, Functional electrical stimulation, Pain management, Self Care/advanced ADL retraining, Therapeutic Activities, UE/LE Coordination activities, Cognitive remediation/compensation, Disease mangement/prevention, Functional mobility training, Patient/family education, Skin care/wound managment, Therapeutic Exercise, Visual/perceptual remediation/compensation, Wheelchair propulsion/positioning, UE/LE Strength taining/ROM, Splinting/orthotics, Psychosocial support, Neuromuscular re-education, DME/adaptive equipment instruction, Community reintegration  SLP Interventions Cognitive remediation/compensation, English as a second language teacher, Dysphagia/aspiration precaution training, Functional tasks, Patient/family education  TR Interventions    SW/CM Interventions Discharge Planning, Psychosocial Support, Patient/Family Education   Barriers to Discharge MD  Medical stability, Lack of/limited family support and Behavior  Nursing      PT Inaccessible home environment, Behavior, Other (comments) Aphasia  OT Behavior    SLP Decreased caregiver support    SW       Team Discharge Planning: Destination: PT-Home ,OT- Home , SLP-Home Projected Follow-up: PT-Home health PT, 24 hour supervision/assistance, OT-  Outpatient OT, SLP-Home Health  SLP, Outpatient SLP, 24 hour supervision/assistance Projected Equipment Needs: PT-To be determined, OT- To be determined, SLP-None recommended by SLP Equipment Details: PT- , OT-  Patient/family involved in discharge planning: PT- Patient(Girlfriend present),  OT-Patient,  Family member/caregiver, SLP-Patient, Family member/caregiver  MD ELOS: 18-21 days. Medical Rehab Prognosis:  Good Assessment: 52 y.o. right handed male with history of HTN, rectal cancer s/p resection with chemo, neuropathy, blindness. Admitted on 03/20/16 with mental status changes. CT head showed R-MCA infarct with slight mass effect on right lateral vessels. CTA/P head revealed R-MCA infarct with emergent large vessel occlusion. patient did not receive TPA. Repeat CTP showed core infarct and patient not candidate for intervention. Dr. Leonel Ramsay felt that stroke embolic in nature. 2 D echo showed EF- 60-65% with grade 1 diastolic dysfunction, moderate LAE and indeterminate filling pressures. Maintained on aspirin for CVA prophylaxis. Subcutaneous Lovenox for DVT prophylaxis. TEE showed ejection fraction 65% no thrombus, negative for PFO and loop recorder placement 03/24/2017.  BLE dopplers negative for DVT. Mild hypokalemia with supplement added. Patient with resultant lethargy, left sided weakness, mild dysphagia, cognitive and linguistic deficits, impulsivity with poor safety awareness. Will set goals for Min A/Supervision PT/OT and Min A SLP.  See Team Conference Notes for weekly updates to the plan of care

## 2017-03-27 NOTE — Progress Notes (Signed)
Occupational Therapy Session Note  Patient Details  Name: [name] [name] MRN: [id number] Date of Birth: 10-25-1965  Today's Date: 03/27/2017 OT Individual Time: 3016-0109 OT Individual Time Calculation (min): 75 min    Short Term Goals: Week 1:  OT Short Term Goal 1 (Week 1): Pt will complete UB dressing with Min A OT Short Term Goal 2 (Week 1): Pt will initiate LB dressing completion when handed necessary items with min vcs  OT Short Term Goal 3 (Week 1): Pt will complete bathing with Min A  Skilled Therapeutic Interventions/Progress Updates:    Pt received in bed and needed max cues to waken.  Pt agreeable to shower.  This session pt needed max cues with all mobility as once he came to stand with min A he had great difficulty motor planning which way to turn and how to turn.  Used hand over hand guiding for pt to feel the surface he was moving to, to feel the grab bars or arm rests.  He was slow to process each step and needed cue of 123 each time he stood. He stated in single words he needed to toilet.  Mod A with stand pivot transfer. Pt did have bowel in his boxer shorts.  Pt did cleanse self using L hand and wt shifting to R but he had bowel on his hand and needed total A to clean hand.  Transferred back to w/c to stand pivot to shower seat.  In shower he had more difficulty with motor planning and needed A to wash R arm as he was having difficulty reaching L arm across to R shoulder.  He did use his L arm to wash chest and legs.  He is L hand dominant but with his weakness he has significant difficulty.  Donning shirt he demonstrated significant L inattention and needed full assist to don pants over feet. Pt was not able to recognize that he needed to fully pull pants over L foot, possibly decreased sensation.   Pt set up with breakfast and needed max A with self feeding.  He only wanted to use L hand but had great difficulty manipulating fork/ spoon and finding food on plate even with cues.  Attempted to have pt use his R hand but his apraxia made that hand more limited with manipulating utensils.  Pt expressed his frustration and requested to get back in bed.  Encouraged pt that he can gain more independence.pt completed transfer back to bed.  Bed alarm set and telesitter on.   Therapy Documentation Precautions:  Precautions Precautions: Fall, Other (comment) Precaution Comments: (Blind, global aphasia) Restrictions Weight Bearing Restrictions: No  Pain: Pain Assessment Pain Assessment: No/denies pain ADL: ADL ADL Comments: Please see functional navigator for ADL status  See Function Navigator for Current Functional Status.   Therapy/Group: Individual Therapy  Ethel 03/27/2017, 10:08 AM

## 2017-03-28 ENCOUNTER — Inpatient Hospital Stay (HOSPITAL_COMMUNITY): Payer: Medicaid Other | Admitting: Occupational Therapy

## 2017-03-28 ENCOUNTER — Inpatient Hospital Stay (HOSPITAL_COMMUNITY): Payer: Medicaid Other | Admitting: Speech Pathology

## 2017-03-28 ENCOUNTER — Inpatient Hospital Stay (HOSPITAL_COMMUNITY): Payer: Medicaid Other | Admitting: Physical Therapy

## 2017-03-28 DIAGNOSIS — R0989 Other specified symptoms and signs involving the circulatory and respiratory systems: Secondary | ICD-10-CM

## 2017-03-28 NOTE — Progress Notes (Addendum)
Speech Language Pathology Daily Session Note  Patient Details  Name: [name] [name] MRN: [id number] Date of Birth: [birth year]  Today's Date: 03/28/2017 SLP Individual Time: 1310-1400 SLP Individual Time Calculation (min): 50 min  Short Term Goals: Week 1: SLP Short Term Goal 1 (Week 1): Pt will verbally express wants/needs with in 50% of opportunties with Mod A multimodal cues.  SLP Short Term Goal 2 (Week 1): Pt will answer basic yes/no questions with in 50% of opportunties with Mod A multimdoal cues. SLP Short Term Goal 3 (Week 1): Pt will follow 1 step commands with Mod A multimodal cues.  SLP Short Term Goal 4 (Week 1): Pt will verbally express rote/automatic verbal sequences with Max A multimodal cues.  SLP Short Term Goal 5 (Week 1): Pt will utilize call bell to request assistance with Max A multimodal cues.  SLP Short Term Goal 6 (Week 1): Pt will consume regular diet given hand over hand assistance without overt s/s aspiration.  Skilled Therapeutic Interventions:  Pt was seen for skilled ST targeting cognitive-linguistic goals.  Pt was resting in bed upon therapist's arrival.  He perseverated on saying he was tired but was agreeable to getting out of bed for therapy.  He needed max assist multimodal cues for initiation when coming from supine to sitting position in preparation of transfer from bed to wheelchair.  Pt was noted to be impulsive with mobility during transfer from bed to chair and needed mod-max assist verbal cues to not stand up before therapist was positioned to assist.  He needed max to total assist to complete basic, familiar phrases with a targeted word.  He could follow 1-step commands in a functional context for ~25% accuracy with max assist.  He intermittently had some spontaneous production of appropriate phrases in response to social exchanges; however, these were not consistent and pt's responses were often delayed and not pertinent to therapist's questions.  As  session progressed, pt became increasingly difficult to engage in tasks and his responses became more infrequent.  As a result, session ended early. Pt was returned to bed and left in bed with bed alarm set and call bell within reach.  Continue per current plan of care.    Function:  Eating Eating                 Cognition Comprehension Comprehension assist level: Understands basic 25 - 49% of the time/ requires cueing 50 - 75% of the time  Expression   Expression assist level: Expresses basic 25 - 49% of the time/requires cueing 50 - 75% of the time. Uses single words/gestures.  Social Interaction Social Interaction assist level: Interacts appropriately 50 - 74% of the time - May be physically or verbally inappropriate.  Problem Solving Problem solving assist level: Solves basic 25 - 49% of the time - needs direction more than half the time to initiate, plan or complete simple activities  Memory Memory assist level: Recognizes or recalls less than 25% of the time/requires cueing greater than 75% of the time    Pain Pain Assessment Pain Assessment: Faces Pain Score: 0-No pain  Therapy/Group: Individual Therapy  Arnetia Bronk, Selinda Orion 03/28/2017, 2:03 PM

## 2017-03-28 NOTE — Progress Notes (Signed)
Occupational Therapy Session Note  Patient Details  Name: [name] [name] MRN: [id number] Date of Birth: 04-28-1965  Today's Date: 03/28/2017 OT Individual Time: 5885-0277 OT Individual Time Calculation (min): 58 min    Short Term Goals: Week 1:  OT Short Term Goal 1 (Week 1): Pt will complete UB dressing with Min A OT Short Term Goal 2 (Week 1): Pt will initiate LB dressing completion when handed necessary items with min vcs  OT Short Term Goal 3 (Week 1): Pt will complete bathing with Min A  Skilled Therapeutic Interventions/Progress Updates:    Pt in bed to start session.  He was able to transition to the EOB with min instructional cueing and supervision.  Used the RW for functional mobility to the toilet with mod assist.  He demonstrated bowel incontinence in the brief prior to transfers.  Max assist for removal of LB clothing while sitting on the toilet in preparation for transfer to the shower.  Mod assist for guiding with min assist for balance to transfer over to the shower seat once toileting was finished.  Max demonstrational cueing for bathing secondary to motor planning and receptive difficulties.  Min assist for sit to stand in the shower.  He transferred out to the EOB with mod facilitation for dressing.  When presented with clothing he was able to orient pants and donn them as well as shoes with setup and increased time, secondary to visual deficits.  He needed min instructional cueing for re-direction when donning pullover shirt.  Next had pt ambulate to the sink for oral hygiene with mod assist for guiding walker in unfamiliar environment.  He was able to brush his teeth in standing with supervision but was not aware that when he was rinsing his mouth out he was actually spitting on his hand some.  Mod instructional cueing for re-direction.  Finished session with pt back in bed with bed alarm in place and call button in reach.  Telesitter also in place as well.    Therapy  Documentation Precautions:  Precautions Precautions: Fall, Other (comment) Precaution Comments: (Blind, global aphasia) Restrictions Weight Bearing Restrictions: No  Pain: Pain Assessment Pain Assessment: Faces Pain Score: 0-No pain Faces Pain Scale: No hurt ADL: See Function Navigator for Current Functional Status.   Therapy/Group: Individual Therapy  Miyanna Wiersma OTR/L 03/28/2017, 11:27 AM

## 2017-03-28 NOTE — Progress Notes (Signed)
[place] PHYSICAL MEDICINE & REHABILITATION     PROGRESS NOTE    Subjective/Complaints: Patient seen lying in bed this morning. No reported issues overnight.  ROS: Limited due to cognition  Objective: Vital Signs: Blood pressure 135/78, pulse 60, temperature 97.9 F (36.6 C), temperature source Oral, resp. rate 20, SpO2 97 %. No results found. Recent Labs    03/27/17 1315  WBC 4.7  HGB 14.3  HCT 42.9  PLT 196   Recent Labs    03/27/17 1315  NA 139  K 4.3  CL 107  GLUCOSE 102*  BUN 12  CREATININE 1.02  CALCIUM 9.1   CBG (last 3)  Recent Labs    03/25/17 1131 03/25/17 2021 03/26/17 0623  GLUCAP 76 92 87    Wt Readings from Last 3 Encounters:  03/24/17 100 kg (220 lb 7.4 oz)  03/20/17 99.8 kg (220 lb)  01/26/17 101.1 kg (222 lb 14.4 oz)    Physical Exam:  Constitutional: He appears well-developed and well-nourished. No acute distress.  HENT: Normocephalic and atraumatic.  Eyes: No discharge. Cardiovascular: RRR. No JVD    Respiratory: CTA Bilaterally. Normal effort  Abdominal. +bowel sounds. Non distented Musc: No edema, no tenderness. Neurological. Alert Global aphasia, verbal apraxia.    Motor: Limited due to participation, but moving all extremities spontaneously (stable) Skin. Warm and dry. Intact. Psych: Unable to assess due to mentation  Assessment/Plan: 1. Functional deficits  secondary to right MCA infarct which require 3+ hours per day of interdisciplinary therapy in a comprehensive inpatient rehab setting. Physiatrist is providing close team supervision and 24 hour management of active medical problems listed below. Physiatrist and rehab team continue to assess barriers to discharge/monitor patient progress toward functional and medical goals.  Function:  Bathing Bathing position Bathing activity did not occur: N/A Position: Shower  Bathing parts Body parts bathed by patient: Left arm, Chest, Abdomen, Front perineal area, Buttocks,  Right upper leg, Left upper leg Body parts bathed by helper: Right arm, Right lower leg, Left lower leg  Bathing assist Assist Level: (Mod A)      Upper Body Dressing/Undressing Upper body dressing   What is the patient wearing?: Pull over shirt/dress     Pull over shirt/dress - Perfomed by patient: Put head through opening, Pull shirt over trunk, Thread/unthread right sleeve Pull over shirt/dress - Perfomed by helper: Thread/unthread left sleeve        Upper body assist Assist Level: (Mod A)      Lower Body Dressing/Undressing Lower body dressing   What is the patient wearing?: Pants, Non-skid slipper socks, Ted Hose, Shoes(briefs) Underwear - Performed by patient: Thread/unthread right underwear leg, Thread/unthread left underwear leg, Pull underwear up/down   Pants- Performed by patient: Pull pants up/down Pants- Performed by helper: Thread/unthread right pants leg, Thread/unthread left pants leg   Non-skid slipper socks- Performed by helper: Don/doff right sock, Don/doff left sock       Shoes - Performed by helper: Don/doff right shoe, Don/doff left shoe, Fasten right, Fasten left       TED Hose - Performed by helper: Don/doff right TED hose, Don/doff left TED hose  Lower body assist Assist for lower body dressing: (Mod A)      Toileting Toileting   Toileting steps completed by patient: Adjust clothing prior to toileting, Adjust clothing after toileting, Performs perineal hygiene(pt needed A to clean hands after self cleansing) Toileting steps completed by helper: Adjust clothing prior to toileting, Performs perineal hygiene, Adjust clothing  after toileting Toileting Assistive Devices: Toilet aid  Toileting assist Assist level: Two helpers   Transfers Chair/bed transfer   Chair/bed transfer method: Stand pivot Chair/bed transfer assist level: Touching or steadying assistance (Pt > 75%) Chair/bed transfer assistive device: Medical sales representative      Max distance: 40 ft Assist level: 2 helpers(+2 for safety, min assist)   Wheelchair Wheelchair activity did not occur: Safety/medical concerns(Agitation)   Max wheelchair distance: 50 ft Assist Level: Touching or steadying assistance (Pt > 75%)(B LEs)  Cognition Comprehension Comprehension assist level: Understands basic 25 - 49% of the time/ requires cueing 50 - 75% of the time  Expression Expression assist level: Expresses basic 25 - 49% of the time/requires cueing 50 - 75% of the time. Uses single words/gestures.  Social Interaction Social Interaction assist level: Interacts appropriately 25 - 49% of time - Needs frequent redirection.  Problem Solving Problem solving assist level: Solves basic 25 - 49% of the time - needs direction more than half the time to initiate, plan or complete simple activities  Memory Memory assist level: Recognizes or recalls less than 25% of the time/requires cueing greater than 75% of the time   Medical Problem List and Plan:  1. 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 Status post loop recorder    Cont CIR 2. DVT Prophylaxis/Anticoagulation: Subcutaneous Lovenox. Monitor for any bleeding episodes.    Venous Doppler studies negative  3. Headaches/pain Management:   Fioricet as needed    -continue Tylenol every 6 hours as needed. 4. Mood: LCSW to follow for evaluation and support.  5. Neuropsych: This patient is not capable of making decisions on his own behalf.  6. Skin/Wound Care: routine pressure relief measures.  7. Fluids/Electrolytes/Nutrition: Monitor I/O.    BMP within acceptable range on 2/18, creatinine trending up   Encourage fluids   8. HTN: Lopressor 12.5 mg twice a day    Labile,elevated yesterday, will consider additional medications 9. Dyslipidemia: Lipitor  10. History of rectal cancer with resection and chemotherapy.   Follow-up outpatient    Added Imodium for loose stools   Added probiotic as well to help with  stool consistency  11. Blindness.  Limited vision if any   LOS (Days) 4 A FACE TO FACE EVALUATION WAS PERFORMED  [name] [name] Lorie Phenix, MD 03/28/2017 8:23 AM

## 2017-03-28 NOTE — Progress Notes (Signed)
Physical Therapy Session Note  Patient Details  Name: [name] [name] MRN: [id number] Date of Birth: December 15, 1965  Today's Date: 03/28/2017 PT Individual Time: 6283-6629 PT Individual Time Calculation (min): 70 min  Missed time: 20 min (fatigue and agitation)   Short Term Goals: Week 1:  PT Short Term Goal 1 (Week 1): Pt will perform bed to chair transfer with min A x 1 and LRAD in 1 min or less. PT Short Term Goal 2 (Week 1): Pt will ambulate 20 ft with LRAD and min A. PT Short Term Goal 3 (Week 1): Pt will perform supine to sit and return with supervision.  Skilled Therapeutic Interventions/Progress Updates:   Pt in supine and agreeable to therapy w/ max encouragement. Suspect pt was not understanding therapist's role, however agreeable once demonstrated understanding of PT, no c/o pain. Max multimodal cues throughout session for safety, obstacle negotiation, and to follow 1 step commands. Cues included hand-over-hand, verbal, and tactile cues. Transferred to EOB w/ min assist and ambulated to/from gym using RW w/ min guard, max cues as described above. Focused on static standing balance w/o UE support while performing letter identification task w/ wooden letters. Pt successful 50% of time using hands to identify letter, additionally added naming task once letter identified. Focused on attention to task and command following during seated rest breaks while performing functional tasks including buttoning/unbuttoning. Tactile and verbal cues to attend to task. Ambulated 150' w/ RW, mod assist overall for RW management and obstacle avoidance. Practiced ambulating 108' and 150' w/o AD w/ single HHA, min assist overall as PT better able to manually cue pt for direction and obstacle avoidance. Verbal cues for gait pattern w/o AD. During last walk, pt w/ increased agitation and fatigue, insisting on sitting down despite max cues that there was not a chair behind him to sit. Therapist able to keep pt in  standing while ambulating back to room. Emphasized throughout session for pt to turn and feel chair/bed on back of legs prior to sitting down for safety, able to perform w/o max cues 25% of time. Ended session in supine, call bell within reach and all needs met. Missed 20 min of skilled PT 2/2 fatigue and increased agitation.   Therapy Documentation Precautions:  Precautions Precautions: Fall, Other (comment) Precaution Comments: (Blind, global aphasia) Restrictions Weight Bearing Restrictions: No Vital Signs: Therapy Vitals Temp: 97.8 F (36.6 C) Temp Source: Oral Pulse Rate: (!) 50 Resp: 16 BP: (!) 143/86 Patient Position (if appropriate): Sitting Oxygen Therapy SpO2: 94 % O2 Device: Not Delivered Pain:    See Function Navigator for Current Functional Status.   Therapy/Group: Individual Therapy  [name] [name] 03/28/2017, 3:28 PM

## 2017-03-29 ENCOUNTER — Inpatient Hospital Stay (HOSPITAL_COMMUNITY): Payer: Medicaid Other | Admitting: Occupational Therapy

## 2017-03-29 ENCOUNTER — Inpatient Hospital Stay (HOSPITAL_COMMUNITY): Payer: Medicaid Other | Admitting: Speech Pathology

## 2017-03-29 ENCOUNTER — Inpatient Hospital Stay (HOSPITAL_COMMUNITY): Payer: Medicaid Other

## 2017-03-29 DIAGNOSIS — Z85048 Personal history of other malignant neoplasm of rectum, rectosigmoid junction, and anus: Secondary | ICD-10-CM

## 2017-03-29 DIAGNOSIS — R195 Other fecal abnormalities: Secondary | ICD-10-CM

## 2017-03-29 MED ORDER — CALCIUM POLYCARBOPHIL 625 MG PO TABS
1250.0000 mg | ORAL_TABLET | Freq: Every day | ORAL | Status: DC
  Administered 2017-03-29 – 2017-04-07 (×10): 1250 mg via ORAL
  Filled 2017-03-29 (×10): qty 2

## 2017-03-29 NOTE — Progress Notes (Signed)
[facility] PHYSICAL MEDICINE & REHABILITATION     PROGRESS NOTE    Subjective/Complaints: Patient seen lying in bed this morning. Patient indicates that he slept fairly due to diarrhea. Confirmed with nursing patient with loose stools overnight.  ROS: Ltd. due to cognition  Objective: Vital Signs: Blood pressure 130/75, pulse 65, temperature 97.8 F (36.6 C), temperature source Oral, resp. rate 18, SpO2 98 %. No results found. Recent Labs    03/27/17 1315  WBC 4.7  HGB 14.3  HCT 42.9  PLT 196   Recent Labs    03/27/17 1315  NA 139  K 4.3  CL 107  GLUCOSE 102*  BUN 12  CREATININE 1.02  CALCIUM 9.1   CBG (last 3)  No results for input(s): GLUCAP in the last 72 hours.  Wt Readings from Last 3 Encounters:  03/24/17 100 kg (220 lb 7.4 oz)  03/20/17 99.8 kg (220 lb)  01/26/17 101.1 kg (222 lb 14.4 oz)    Physical Exam:  Constitutional: He appears well-developed and well-nourished. No acute distress.  HENT: Normocephalic and atraumatic.  Eyes: No discharge. Cardiovascular: RRR. No JVD    Respiratory: CTA Bilaterally. Normal effort  Abdominal. +bowel sounds. Non distended Musc: No edema, no tenderness. Neurological. Alert Global aphasia, verbal apraxia.    Motor: Limited due to participation, but moving all extremities spontaneously (unchanged) Skin. Warm and dry. Intact. Psych: Unable to assess due to mentation  Assessment/Plan: 1. Functional deficits  secondary to right MCA infarct which require 3+ hours per day of interdisciplinary therapy in a comprehensive inpatient rehab setting. Physiatrist is providing close team supervision and 24 hour management of active medical problems listed below. Physiatrist and rehab team continue to assess barriers to discharge/monitor patient progress toward functional and medical goals.  Function:  Bathing Bathing position Bathing activity did not occur: N/A Position: Shower  Bathing parts Body parts bathed by patient:  Right arm, Chest, Abdomen, Front perineal area, Buttocks, Right upper leg, Left upper leg, Right lower leg, Left lower leg Body parts bathed by helper: Left arm, Back  Bathing assist Assist Level: (Mod A)      Upper Body Dressing/Undressing Upper body dressing   What is the patient wearing?: Pull over shirt/dress     Pull over shirt/dress - Perfomed by patient: Put head through opening, Pull shirt over trunk, Thread/unthread right sleeve Pull over shirt/dress - Perfomed by helper: Thread/unthread left sleeve        Upper body assist Assist Level: (Mod A)      Lower Body Dressing/Undressing Lower body dressing   What is the patient wearing?: Pants, Ted Hose, Shoes, Non-skid slipper socks Underwear - Performed by patient: Thread/unthread right underwear leg, Thread/unthread left underwear leg, Pull underwear up/down   Pants- Performed by patient: Thread/unthread right pants leg, Thread/unthread left pants leg, Pull pants up/down Pants- Performed by helper: Thread/unthread right pants leg, Thread/unthread left pants leg   Non-skid slipper socks- Performed by helper: Don/doff right sock, Don/doff left sock     Shoes - Performed by patient: Don/doff right shoe, Don/doff left shoe Shoes - Performed by helper: Don/doff right shoe, Don/doff left shoe, Fasten right, Fasten left       TED Hose - Performed by helper: Don/doff right TED hose, Don/doff left TED hose  Lower body assist Assist for lower body dressing: (Mod A)      Toileting Toileting   Toileting steps completed by patient: Performs perineal hygiene Toileting steps completed by helper: Adjust clothing prior to  toileting, Adjust clothing after toileting Toileting Assistive Devices: Toilet aid  Toileting assist Assist level: Two helpers   Transfers Chair/bed transfer   Chair/bed transfer method: Stand pivot, Ambulatory Chair/bed transfer assist level: Touching or steadying assistance (Pt > 75%) Chair/bed transfer  assistive device: Armrests, Medical sales representative     Max distance: 150 Assist level: Moderate assist (Pt 50 - 74%)   Wheelchair Wheelchair activity did not occur: Safety/medical concerns(Agitation)   Max wheelchair distance: 50 ft Assist Level: Touching or steadying assistance (Pt > 75%)(B LEs)  Cognition Comprehension Comprehension assist level: Understands basic 25 - 49% of the time/ requires cueing 50 - 75% of the time  Expression Expression assist level: Expresses basic 25 - 49% of the time/requires cueing 50 - 75% of the time. Uses single words/gestures.  Social Interaction Social Interaction assist level: Interacts appropriately 25 - 49% of time - Needs frequent redirection.  Problem Solving Problem solving assist level: Solves basic 25 - 49% of the time - needs direction more than half the time to initiate, plan or complete simple activities  Memory Memory assist level: Recognizes or recalls less than 25% of the time/requires cueing greater than 75% of the time   Medical Problem List and Plan:  1. 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 Status post loop recorder    Cont CIR 2. DVT Prophylaxis/Anticoagulation: Subcutaneous Lovenox. Monitor for any bleeding episodes.    Venous Doppler studies negative  3. Headaches/pain Management:   Fioricet as needed    -continue Tylenol every 6 hours as needed. 4. Mood: LCSW to follow for evaluation and support.  5. Neuropsych: This patient is not capable of making decisions on his own behalf.  6. Skin/Wound Care: routine pressure relief measures.  7. Fluids/Electrolytes/Nutrition: Monitor I/O.    BMP within acceptable range on 2/18, creatinine trending up   Encourage fluids   8. HTN: Lopressor 12.5 mg twice a day    Relatively controlled on 2/20 9. Dyslipidemia: Lipitor  10. History of rectal cancer with resection and chemotherapy.   Follow-up outpatient    Added Imodium for loose stools, encouraged  use   Added probiotic as well to help with stool consistency   Fiber added on 2/20  11. Blindness.  Limited vision if any   LOS (Days) 5 A FACE TO FACE EVALUATION WAS PERFORMED  Muskan Bolla Lorie Phenix, MD 03/29/2017 7:28 AM

## 2017-03-29 NOTE — Progress Notes (Signed)
Occupational Therapy Session Note  Patient Details  Name: [name] [name] MRN: [id number] Date of Birth: [birth year]  Today's Date: 03/29/2017 OT Individual Time: 1749-4496 OT Individual Time Calculation (min): 55 min    Short Term Goals: Week 1:  OT Short Term Goal 1 (Week 1): Pt will complete UB dressing with Min A OT Short Term Goal 2 (Week 1): Pt will initiate LB dressing completion when handed necessary items with min vcs  OT Short Term Goal 3 (Week 1): Pt will complete bathing with Min A  Skilled Therapeutic Interventions/Progress Updates:    Pt completed transfer from EOB to the toilet with min assist and therapist's guidance secondary to vision loss.  He attempted to use the urinal while sitting on the toilet, but was unable to successfully get all urine in the urinal and therapist/nursing had to assist with cleanup.  Once toileting was completed, pt transferred over to the shower seat with min assist and worked on bathing.  He was able to remove all clothing for shower with supervision and mod demonstrational cueing.  Bathing overall sit to stand with max demonstrational cueing for sequencing and thoroughness.  At times therapist would place the hand held shower in his left hand for rinsing but he would not grasp it.  When place in the right he would grasp and start to use automatically.  He did however use the LUE throughout session spontaneously for washing as well as dressing tasks.  Pt transitioned out to the EOB for dressing.  Therapist assisted with TEDs and brief but then he completed all other dressing with min assist sit to stand.  Had him ambulate to the sink with min guidance to complete oral hygiene as well.  Finished session with transfer back to the bed and bed alarm turned on.  Pt with call button and phone in reach as well.   Therapy Documentation Precautions:  Precautions Precautions: Fall, Other (comment) Precaution Comments: blind, global aphasia Restrictions Weight  Bearing Restrictions: No   Pain: Pain Assessment Pain Assessment: No/denies pain ADL: See Function Navigator for Current Functional Status.   Therapy/Group: Individual Therapy  Brentney Goldbach OTR/L 03/29/2017, 11:28 AM

## 2017-03-29 NOTE — Progress Notes (Signed)
Occupational Therapy Session Note  Patient Details  Name: [name] [name] MRN: [id number] Date of Birth: May 16, 1965  Today's Date: 03/29/2017 OT Individual Time: 9373-4287 OT Individual Time Calculation (min): 27 min    Short Term Goals: Week 1:  OT Short Term Goal 1 (Week 1): Pt will complete UB dressing with Min A OT Short Term Goal 2 (Week 1): Pt will initiate LB dressing completion when handed necessary items with min vcs  OT Short Term Goal 3 (Week 1): Pt will complete bathing with Min A  Skilled Therapeutic Interventions/Progress Updates:    Pt in bed to start session.  Upon entering room greeted pt and told him who I was.  Attempted to ask him if he had seen me earlier today and if so what did we work on.  His responses were not intelligible with regards to the question asked.  Therapist graded question by stating "Who helped you with you bath today?".  He could still not answer the question given in this manner.  He was able to finally state "yes" when therapist asked him if he had helped with the shower this morning.  Had pt transfer to the EOB on the left side.  While sitting EOB tested pt's stereognosis in the left hand.  He was able to correctly identify three objects when presented to him in the left hand.  Next had pt complete transfer to the toilet as he responded "yes" when asked if he needed to go.  Completed functional mobility to the bathroom with mod hand held assist and mod demonstrational cueing.  He needed mod assist to manage clothing secondary to not understanding task when given verbal command.  He instead attempted to remove his shirt, thinking he was going to take a shower.  Once on the toilet he was able to perform toilet hygiene and clothing management once he finished with min assist.  Ambulated out to the sink with mod assist for direction with pt noted keeping head slightly turned more to the right.  Min assist for washing hands including finding foam soap on the wall.   Ambulated pt back to the EOB with min assist and mod demonstrational cueing.  He laid back down on the bed with min demonstrational cueing for initiation of going down on the left side.  Call button in hand with bed alarm in place at end of session.    Therapy Documentation Precautions:  Precautions Precautions: Fall, Other (comment) Precaution Comments: blind, global aphasia Restrictions Weight Bearing Restrictions: No  Pain: Pain Assessment Pain Assessment: No/denies pain Faces Pain Scale: No hurt ADL: See Function Navigator for Current Functional Status.   Therapy/Group: Individual Therapy  Aven Christen OTR/L 03/29/2017, 3:17 PM

## 2017-03-29 NOTE — Progress Notes (Signed)
Physical Therapy Session Note  Patient Details  Name: [name] [name] MRN: [id number] Date of Birth: 08-23-1965  Today's Date: 03/29/2017 PT Individual Time: 1102-1200 PT Individual Time Calculation (min): 58 min   Short Term Goals: Week 1:  PT Short Term Goal 1 (Week 1): Pt will perform bed to chair transfer with min A x 1 and LRAD in 1 min or less. PT Short Term Goal 2 (Week 1): Pt will ambulate 20 ft with LRAD and min A. PT Short Term Goal 3 (Week 1): Pt will perform supine to sit and return with supervision.  Skilled Therapeutic Interventions/Progress Updates:    Pt supine in bed upon PT arrival, agreeable to therapy tx and no evidence of pain. Pt transferred from supine>sitting EOB with min assist, therapist providing tactile cues to get up on L side however pt only going to the R side of bed despite cues. Pt perseverating on "bathroom" however after ambulating into the bathroom pt did not need to go. Pt requiring +2 assist for safety to help guide pt out of bathroom to the recliner in his room. Pt ambulated from room>gym x 150 ft with min assist, +2 for safety, therapist providing tactile cues for turns as pt was unable to follow verbal commands secondary to aphasia. Pt requiring increased cues for L turns, demonstrating inattention to the L side. Pt transported to dayroom. Pt transferred from w/c<>cybex kinetron with min assist +2 stand pivot transfer, hand over hand to reach for seat. Pt used cybex kinetron in sitting for LE strengthening x 5 minutes. Pt ambulated x 250 ft through unit with min assist and +2 for HHA guiding pt for directions and turns, pt requiring increased cueing to turn L. Pt ascended/descended 4 steps x 2 with bilateral handrails and mod assist + 2 for safety, pt requiring multimodal cues to move L UE on handrail secondary to inattention. Pt ambulated back to room x 150 ft with min assist + 2 and left seated in recliner with needs in reach, QRB in place and chair alarm set.  Pt's room rearranged to promote L attention and to create more open pathways.   Therapy Documentation Precautions:  Precautions Precautions: Fall, Other (comment) Precaution Comments: (Blind, global aphasia) Restrictions Weight Bearing Restrictions: No  See Function Navigator for Current Functional Status.   Therapy/Group: Individual Therapy  Netta Corrigan, PT, DPT 03/29/2017, 7:55 AM

## 2017-03-29 NOTE — Progress Notes (Signed)
Speech Language Pathology Daily Session Note  Patient Details  Name: [name] [name] MRN: [id number] Date of Birth: 20-Aug-1965  Today's Date: 03/29/2017 SLP Individual Time: 1300-1400 SLP Individual Time Calculation (min): 60 min  Short Term Goals: Week 1: SLP Short Term Goal 1 (Week 1): Pt will verbally express wants/needs with in 50% of opportunties with Mod A multimodal cues.  SLP Short Term Goal 2 (Week 1): Pt will answer basic yes/no questions with in 50% of opportunties with Mod A multimdoal cues. SLP Short Term Goal 3 (Week 1): Pt will follow 1 step commands with Mod A multimodal cues.  SLP Short Term Goal 4 (Week 1): Pt will verbally express rote/automatic verbal sequences with Max A multimodal cues.  SLP Short Term Goal 5 (Week 1): Pt will utilize call bell to request assistance with Max A multimodal cues.  SLP Short Term Goal 6 (Week 1): Pt will consume regular diet given hand over hand assistance without overt s/s aspiration.  Skilled Therapeutic Interventions:   Pt was seen for skilled ST targeting cognitive-linguistic goals.  SLP facilitated the session with max assist multimodal cues for initiation and sequencing to transfer from bed to wheelchair.  Pt could brush his teeth with set up assist once items were placed in his hands. Pt took a phone call from his sister during session and needed more than a reasonable amount of time to presumably tell her that his rent was due and his foodstamps should have arrived in the mailbox.  Neither sister or therapist could verify pt's intent at the time but sister said she would follow up.    Pt could name the opposite of a given word in 4 out of 5 opportunities with max assist multimodal cues to slow rate due to impulsivity and tangentiality of verbal output which appear to be related to receptive language deficits.  Pt could follow 1 step commands in a structured context with max to hand over hand assist.  Pt was returned to room and left in bed  with bed alarm set and call bell within reach.  Continue per current plan of care.    Function:  Eating Eating     Eating Assist Level: More than reasonable amount of time;Supervision or verbal cues;Help with picking up utensils   Eating Set Up Assist For: Opening containers   Helper Brings Food to Mouth: Occasionally   Cognition Comprehension Comprehension assist level: Understands basic 25 - 49% of the time/ requires cueing 50 - 75% of the time  Expression   Expression assist level: Expresses basic 25 - 49% of the time/requires cueing 50 - 75% of the time. Uses single words/gestures.  Social Interaction Social Interaction assist level: Interacts appropriately 25 - 49% of time - Needs frequent redirection.  Problem Solving Problem solving assist level: Solves basic 25 - 49% of the time - needs direction more than half the time to initiate, plan or complete simple activities  Memory Memory assist level: Recognizes or recalls less than 25% of the time/requires cueing greater than 75% of the time    Pain Pain Assessment Pain Assessment: No/denies pain  Therapy/Group: Individual Therapy  [name] [name], Selinda Orion 03/29/2017, 4:36 PM

## 2017-03-30 ENCOUNTER — Inpatient Hospital Stay (HOSPITAL_COMMUNITY): Payer: Medicaid Other | Admitting: Physical Therapy

## 2017-03-30 ENCOUNTER — Inpatient Hospital Stay (HOSPITAL_COMMUNITY): Payer: Medicaid Other | Admitting: Speech Pathology

## 2017-03-30 ENCOUNTER — Inpatient Hospital Stay (HOSPITAL_COMMUNITY): Payer: Medicaid Other | Admitting: Occupational Therapy

## 2017-03-30 NOTE — Progress Notes (Signed)
[facility] PHYSICAL MEDICINE & REHABILITATION     PROGRESS NOTE    Subjective/Complaints: Patient seen lying in bed this morning. No reported issues overnight.  ROS: Limited due to cognition  Objective: Vital Signs: Blood pressure 124/80, pulse (!) 45, temperature 98 F (36.7 C), temperature source Oral, resp. rate 18, weight 108.3 kg (238 lb 12.1 oz), SpO2 98 %. No results found. Recent Labs    03/27/17 1315  WBC 4.7  HGB 14.3  HCT 42.9  PLT 196   Recent Labs    03/27/17 1315  NA 139  K 4.3  CL 107  GLUCOSE 102*  BUN 12  CREATININE 1.02  CALCIUM 9.1   CBG (last 3)  No results for input(s): GLUCAP in the last 72 hours.  Wt Readings from Last 3 Encounters:  03/30/17 108.3 kg (238 lb 12.1 oz)  03/24/17 100 kg (220 lb 7.4 oz)  03/20/17 99.8 kg (220 lb)    Physical Exam:  Constitutional: He appears well-developed and well-nourished. No acute distress.  HENT: Normocephalic and atraumatic.  Eyes: No discharge. Cardiovascular: RRR. No JVD    Respiratory: CTA Bilaterally. Normal effort  Abdominal. +bowel sounds. Non distended Musc: No edema, no tenderness. Neurological. Alert Global aphasia, verbal apraxia.    Motor: Limited due to participation, but moving all extremities spontaneously (stable) Skin. Warm and dry. Intact. Psych: Unable to assess due to mentation  Assessment/Plan: 1. Functional deficits  secondary to right MCA infarct which require 3+ hours per day of interdisciplinary therapy in a comprehensive inpatient rehab setting. Physiatrist is providing close team supervision and 24 hour management of active medical problems listed below. Physiatrist and rehab team continue to assess barriers to discharge/monitor patient progress toward functional and medical goals.  Function:  Bathing Bathing position Bathing activity did not occur: N/A Position: Shower  Bathing parts Body parts bathed by patient: Right arm, Chest, Abdomen, Front perineal area,  Buttocks, Right upper leg, Left upper leg, Right lower leg, Left lower leg, Left arm Body parts bathed by helper: Back  Bathing assist Assist Level: Touching or steadying assistance(Pt > 75%)      Upper Body Dressing/Undressing Upper body dressing   What is the patient wearing?: Pull over shirt/dress     Pull over shirt/dress - Perfomed by patient: Put head through opening, Pull shirt over trunk, Thread/unthread right sleeve Pull over shirt/dress - Perfomed by helper: Thread/unthread left sleeve        Upper body assist Assist Level: Supervision or verbal cues      Lower Body Dressing/Undressing Lower body dressing   What is the patient wearing?: Pants, Ted Hose, Shoes Underwear - Performed by patient: Pull underwear up/down   Pants- Performed by patient: Thread/unthread right pants leg, Thread/unthread left pants leg, Pull pants up/down, Fasten/unfasten pants Pants- Performed by helper: Thread/unthread right pants leg, Thread/unthread left pants leg   Non-skid slipper socks- Performed by helper: Don/doff right sock, Don/doff left sock     Shoes - Performed by patient: Don/doff right shoe, Don/doff left shoe, Fasten left, Fasten right Shoes - Performed by helper: Don/doff right shoe, Don/doff left shoe, Fasten right, Fasten left       TED Hose - Performed by helper: Don/doff right TED hose, Don/doff left TED hose  Lower body assist Assist for lower body dressing: Touching or steadying assistance (Pt > 75%)      Toileting Toileting   Toileting steps completed by patient: Adjust clothing prior to toileting, Performs perineal hygiene, Adjust clothing after  toileting Toileting steps completed by helper: Adjust clothing after toileting Toileting Assistive Devices: Toilet aid  Toileting assist Assist level: More than reasonable time, Set up/obtain supplies, Supervision or verbal cues   Transfers Chair/bed transfer   Chair/bed transfer method: Stand pivot,  Ambulatory Chair/bed transfer assist level: Touching or steadying assistance (Pt > 75%) Chair/bed transfer assistive device: Armrests     Locomotion Ambulation     Max distance: 250 ft Assist level: 2 helpers   Oceanographer activity did not occur: Animal nutritionist)   Max wheelchair distance: 50 ft Assist Level: Touching or steadying assistance (Pt > 75%)(B LEs)  Cognition Comprehension Comprehension assist level: Understands basic 25 - 49% of the time/ requires cueing 50 - 75% of the time  Expression Expression assist level: Expresses basic 25 - 49% of the time/requires cueing 50 - 75% of the time. Uses single words/gestures.  Social Interaction Social Interaction assist level: Interacts appropriately 25 - 49% of time - Needs frequent redirection.  Problem Solving Problem solving assist level: Solves basic 25 - 49% of the time - needs direction more than half the time to initiate, plan or complete simple activities  Memory Memory assist level: Recognizes or recalls less than 25% of the time/requires cueing greater than 75% of the time   Medical Problem List and Plan:  1. 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 Status post loop recorder    Cont CIR 2. DVT Prophylaxis/Anticoagulation: Subcutaneous Lovenox. Monitor for any bleeding episodes.    Venous Doppler studies negative  3. Headaches/pain Management:   Fioricet as needed    -continue Tylenol every 6 hours as needed. 4. Mood: LCSW to follow for evaluation and support.  5. Neuropsych: This patient is not capable of making decisions on his own behalf.  6. Skin/Wound Care: routine pressure relief measures.  7. Fluids/Electrolytes/Nutrition: Monitor I/O.    BMP within acceptable range on 2/18, creatinine trending up   Encourage fluids   8. HTN: Lopressor 12.5 mg twice a day    Relatively controlled on 2/21 9. Dyslipidemia: Lipitor  10. History of rectal cancer with resection and  chemotherapy.   Follow-up outpatient    Added Imodium for loose stools, encouraged use   Added probiotic as well to help with stool consistency   Fiber added on 2/20   Appears to be improving 11. Blindness.  Limited vision if any   LOS (Days) 6 A FACE TO FACE EVALUATION WAS PERFORMED  Patti Shorb Lorie Phenix, MD 03/30/2017 8:28 AM

## 2017-03-30 NOTE — Progress Notes (Signed)
Physical Therapy Session Note  Patient Details  Name: [name] [name] MRN: [id number] Date of Birth: [birth year]  Today's Date: 03/30/2017 PT Individual Time: 0900-0957 AND 1100-1130 PT Individual Time Calculation (min): 57 min AND 30 min  and Today's Date: 03/30/2017 PT Missed Time: 30 Minutes Missed Time Reason: Patient unwilling to participate  Short Term Goals: Week 1:  PT Short Term Goal 1 (Week 1): Pt will perform bed to chair transfer with min A x 1 and LRAD in 1 min or less. PT Short Term Goal 2 (Week 1): Pt will ambulate 20 ft with LRAD and min A. PT Short Term Goal 3 (Week 1): Pt will perform supine to sit and return with supervision.  Skilled Therapeutic Interventions/Progress Updates:   Session 1:  Pt supine and agreeable to therapy, no c/o pain. Transferred supine to EOB on L side w/ min verbal cues, improved from previous sessions. Min assist to don socks and shoes w/ tactile/verbal cues for initiation and attention to task. 2nd helper present throughout session for safety and to guide pt forward via unilateral or bilateral HHA w/ ambulation 2/2 blindness and L inattention while therapist provided min assist for balance during functional activities. Max verbal, tactile, and manual cues throughout session for directions, turning, reaching forward to identify obstacles in way such as walls or doors, identifying location of objects in room including toilet paper roll and sink, and for safe technique w/ functional mobility. Discussed w/ pt throughout session the purpose of learning a new environment and making compensations for blindness. Began to ambulate out of room when pt reported needing to toilet. Ambulated back into room and to toilet, had continent BM. Mod cues for LE management, pt performed pericare w/ supervision cues only, and maintained dynamic standing balance while managing LE garments w/ min guard. Ambulated to sink to wash hands, mod assist overall for cues. Ambulated 77'  to stairwell and negotiated 10 steps w/ unilateral handrail assist and max assist x2 for safety and verbal/tactile/manual cues required to perform task safely. 1 seated rest break prior to performing car transfer w/ min assist x2 for manual and verbal cues. Negotiated ramp and uneven surface w/ min assist x2 and verbal/manual cues for following rail around to wall and back down ramp. 1 seated rest break before ambulating back to room,100', w/ min assist x2. Educated pt on importance of sitting upright in between therapies, pt reluctant however eventually agreeable to sit up in recliner until next therapy session in 1 hr. Ended session in recliner, call bell within reach and QRB donned. All needs met.   Session 2:  Pt in supine and agreeable to therapy, requesting to toilet. Ambulated to/from toilet w/ mod assist x1 overall for directional cues and balance. Verbal and tactile cues for LE garment management, supervision for pericare, and total assist to don new brief after incontinent BM. Pt removed shirt during toileting task and refused to put shirt back on despite max cues. Ambulated to sink w/ same cues listed above and min verbal and tactile cues to identify objects at sink including towels and soap. Returned to recliner for a brief rest and to don shirt before leaving room. Pt continued to refuse putting on clothes stating he didn't want to do therapy, he was hot, and was tired. Pt did not respond to repeated choices given for tasks to perform, suspect behavioral. Did not attempt to don QRB 2/2 agitation noted during previous session. Max cues to return to EOB when pt did  choose to respond to therapist. Ended session in supine, call bell within reach and all needs met. Missed 30 min of skilled PT 2/2 refusal.   Therapy Documentation Precautions:  Precautions Precautions: Fall, Other (comment) Precaution Comments: blind, global aphasia Restrictions Weight Bearing Restrictions: No Vital Signs: Therapy  Vitals Pulse Rate: (!) 52 BP: 125/76 Patient Position (if appropriate): Lying  See Function Navigator for Current Functional Status.   Therapy/Group: Individual Therapy  Somaya Grassi K Arnette 03/30/2017, 11:44 AM

## 2017-03-30 NOTE — Progress Notes (Signed)
Social Work Patient ID: [name] [name], male   DOB: 10/23/1965, 52 y.o.   MRN: [id number]  Met with pt and spoke with [facility] and Mom to discuss team conference goals supervision-min assist level and the need for 24 hr care. Both are aware of this and will make arrangements for this. Made aware target discharge date is 3/1. Mom scheduled to come in Sat @ 11:00 to attend therapies with him and teel [place] how different he is than before. Legrand Como will need to come in also since he will be the caregiver also. Alvester Chou to talk with and get back with this worker about day and time. Will work on discharge needs.

## 2017-03-30 NOTE — Progress Notes (Signed)
Occupational Therapy Session Note  Patient Details  Name: [name] [name] MRN: [id number] Date of Birth: 27-Jan-1966  Today's Date: 03/30/2017 OT Individual Time: 4680-3212 OT Individual Time Calculation (min): 42 min    Short Term Goals: Week 1:  OT Short Term Goal 1 (Week 1): Pt will complete UB dressing with Min A OT Short Term Goal 2 (Week 1): Pt will initiate LB dressing completion when handed necessary items with min vcs  OT Short Term Goal 3 (Week 1): Pt will complete bathing with Min A  Skilled Therapeutic Interventions/Progress Updates:    Pt completed grooming task at the sink of washing hands to start session.  Mod demonstrational cueing and min assist needed to complete task.  Mod assistance needed for guiding pt back to the EOB once grooming was completed.  Had him donn pullover shirt with supervision and mod demonstrational cueing for initiation.  He needed min assist for tying the left shoe after donning both of them.  The right shoe was already tied when he donned it. Therapist asked pt what things he like to do and he responded with "weights".  Next had pt ambulate down to the ortho gym and transfer to the therapy mat.  Mod assist needed secondary to vision and unfamiliar environment.  He was able to ambulate with min assist for straight mobility.  He was able to make turns to the right when given verbal cueing however, which was an improvement from yesterday.  Once in the gym had pt work on UE exercises using dumbbells.  He was able to use 10 LB dumbbells for elbow flexion bilaterally for 2 sets of 8-10 repetitions and mod assist.  Pt with increased motor planning difficulty and only completing partial reps of movement, even when given physical guidance.  Transitioned to 4 Lb wrist weights for the same movement, again with pt demonstrating only partial repetitions at times.  Mod assist for completion of shoulder flexion bilaterally 10 repetitions as well as shoulder presses.  Slight  weakness noted in the left shoulder compared to the right during the shoulder presses.  Finished session with pt ambulating back to his room and then to the bed.  Pt left with call bell in hand and bed alarm in place.    Therapy Documentation Precautions:  Precautions Precautions: Fall, Other (comment) Precaution Comments: blind, global aphasia Restrictions Weight Bearing Restrictions: No  Pain: Pain Assessment Pain Assessment: No/denies pain ADL: See Function Navigator for Current Functional Status.   Therapy/Group: Individual Therapy  Dietra Stokely OTR/L 03/30/2017, 4:18 PM

## 2017-03-30 NOTE — Progress Notes (Signed)
Speech Language Pathology Daily Session Note  Patient Details  Name: KOLSTON LACOUNT MRN: [id number] Date of Birth: 02/01/1966  Today's Date: 03/30/2017 SLP Individual Time: 1300-1400 SLP Individual Time Calculation (min): 60 min  Short Term Goals: Week 1: SLP Short Term Goal 1 (Week 1): Pt will verbally express wants/needs with in 50% of opportunties with Mod A multimodal cues.  SLP Short Term Goal 2 (Week 1): Pt will answer basic yes/no questions with in 50% of opportunties with Mod A multimdoal cues. SLP Short Term Goal 3 (Week 1): Pt will follow 1 step commands with Mod A multimodal cues.  SLP Short Term Goal 4 (Week 1): Pt will verbally express rote/automatic verbal sequences with Max A multimodal cues.  SLP Short Term Goal 5 (Week 1): Pt will utilize call bell to request assistance with Max A multimodal cues.  SLP Short Term Goal 6 (Week 1): Pt will consume regular diet given hand over hand assistance without overt s/s aspiration.  Skilled Therapeutic Interventions:  Pt was seen for skilled ST targeting communication and dysphagia goals.  SLP facilitated the session with skilled observations completed during presentations of pt's currently prescribed diet.  Following initial set up assistance for locating items, pt consumed regular textures and thin liquids with mod I use of swallowing precautions and no overt s/s of aspiration.  No further ST needs for dysphagia are indicated at this time. SLP also utilized music to elicit language production during treatment.  While pt did not sing along to targeted songs, he did spontaneously generate some appropriate responses when asked who his favorite artists were.  Pt also had improved turn taking for expressive output during basic conversational exchanges with max assist for self regulation, even if responses were not always pertinent to the topic at hand. Pt initiated request to use the bathroom independently and followed 1-step commands in a  functional context with mod assist multimodal cues.  Pt was handed off to OT at the end of today's therapy session. Continue per current plan of care.     Function:  Eating Eating   Modified Consistency Diet: No Eating Assist Level: More than reasonable amount of time;Supervision or verbal cues;Help with picking up utensils           Cognition Comprehension Comprehension assist level: Understands basic 50 - 74% of the time/ requires cueing 25 - 49% of the time  Expression   Expression assist level: Expresses basic 50 - 74% of the time/requires cueing 25 - 49% of the time. Needs to repeat parts of sentences.  Social Interaction Social Interaction assist level: Interacts appropriately 25 - 49% of time - Needs frequent redirection.  Problem Solving Problem solving assist level: Solves basic 25 - 49% of the time - needs direction more than half the time to initiate, plan or complete simple activities  Memory Memory assist level: Recognizes or recalls less than 25% of the time/requires cueing greater than 75% of the time    Pain Pain Assessment Pain Assessment: No/denies pain  Therapy/Group: Individual Therapy  Nijel Flink, Selinda Orion 03/30/2017, 3:43 PM

## 2017-03-30 NOTE — Patient Care Conference (Signed)
Inpatient RehabilitationTeam Conference and Plan of Care Update Date: 03/29/2017   Time: 2:55 PM    Patient Name: [name] [name]      Medical Record Number: [id number]  Date of Birth: Dec 31, 1965 Sex: Male         Room/Bed: 4M04C/4M04C-01 Payor Info: Payor: MEDICAID [place] / Plan: MEDICAID [place] ACCESS / Product Type: *No Product type* /    Admitting Diagnosis: CVA  Admit Date/Time:  03/24/2017  6:44 PM Admission Comments: No comment available   Primary Diagnosis:  Right middle cerebral artery stroke [facility]) Principal Problem: Right middle cerebral artery stroke [facility])  Patient Active Problem List   Diagnosis Date Noted  . Loose stools   . History of rectal cancer   . Labile blood pressure   . Vascular headache   . Benign essential HTN   . Global aphasia   . Blind   . 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 Stroke (cerebrum) ([place]) 03/20/2017  . Hydronephrosis of left kidney 06/09/2016  . 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 Hypokalemia 07/22/2015  . Diarrhea 07/22/2015  . Rectal cancer ([place]) 06/18/2015    Expected Discharge Date: Expected Discharge Date: 04/07/17  Team Members Present: Physician leading conference: Dr. 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  Improve BP, cognition, mobility, safety, loose stools  See above   Bowel/Bladder   incont of bowel at times, especially at night; lbm 2/20; cont/incont of bladder--difficulty starting stream; PVR Q4-6hr  timed toileting q3hr  timed toileting q3 hr; PVR Q4-6hr; stand up to urinate at bedside or in toilet; diaper   Swallow/Nutrition/ Hydration   regular textures, thin liqiuds;  finger foods needs full supervision due to visual deficits   mod I   toleration of current diet    ADL's   Min assist for UB bathing and dressing, min assist for LB bathing and dressing.  Mod assist for functional transfers to the toilet and shower secondary to visual deficits.  Receptive and expressive difficulties noted as well with some left in-attention  supervsion overall with min assist for tub transfers  selfcare retraining, balance retraining, neuromuscular re-education, therapeutic activities, pt/family education   Mobility   min-mod assist overall (physically), max multimodal cues for safety and RW management, gait up to 150' w/ or w/o RW  min assist to supervision  command following, cognition w/ funcitonal mobility, compensations for blindness, standing balance, endurance   Communication   max assist   min assist   naming, functional communication, following 1-2 step commands   Safety/Cognition/ Behavioral Observations  legally blind, confused at times; expressive and receptive aphasia; does not always follow commands; attempts to transfer himself  call appropriately with call light  maintain bed setting; call light within reach; education   Pain   Faces=0  maintain  assess q shift and prn   Skin   CDI; old scars  maintain  assess q shift and prn      *See Care Plan and progress notes for long and short-term goals.     Barriers to Discharge  Current Status/Progress Possible Resolutions Date Resolved   Physician    Medical stability;Other (comments)  Blind  See above  Therapies, optimize BP, optimize bowel meds      Nursing                  PT  Inaccessible home environment;Behavior;Other (comments)  aphasia, blindness              OT                  SLP                SW                Discharge Planning/Teaching Needs:  HOme with family who plan to provide 24 hr care between all of them. They have assisted pt with transportation and home management prior to  admission      Team Discussion:  Goals supervision-min assist level, needs much cueing and assist with sequencing. More cues when turning to the left, due to left side neglect. Impulsive at times. RN working on timed toileting for continence. Need family education soon. MD reports BP issues-working on.   Revisions to Treatment Plan:  DC 3/1    Continued Need for Acute Rehabilitation Level of Care: The patient requires daily medical management by a physician with specialized training in physical medicine and rehabilitation for the following conditions: Daily direction of a multidisciplinary physical rehabilitation program to ensure safe treatment while eliciting the highest outcome that is of practical value to the patient.: Yes Daily medical management of patient stability for increased activity during participation in an intensive rehabilitation regime.: Yes Daily analysis of laboratory values and/or radiology reports with any subsequent need for medication adjustment of medical intervention for : Neurological problems;Blood pressure problems;Other  Elease Hashimoto 03/30/2017, 9:14 AM

## 2017-03-31 ENCOUNTER — Inpatient Hospital Stay (HOSPITAL_COMMUNITY): Payer: Medicaid Other | Admitting: Occupational Therapy

## 2017-03-31 ENCOUNTER — Inpatient Hospital Stay (HOSPITAL_COMMUNITY): Payer: Medicaid Other | Admitting: Speech Pathology

## 2017-03-31 ENCOUNTER — Inpatient Hospital Stay (HOSPITAL_COMMUNITY): Payer: Medicaid Other

## 2017-03-31 DIAGNOSIS — R7989 Other specified abnormal findings of blood chemistry: Secondary | ICD-10-CM

## 2017-03-31 LAB — CREATININE, SERUM
Creatinine, Ser: 1.17 mg/dL (ref 0.61–1.24)
GFR calc Af Amer: >60 mL/min (ref >60)
GFR calc non Af Amer: >60 mL/min (ref >60)

## 2017-03-31 NOTE — Progress Notes (Signed)
Physical Therapy Session Note  Patient Details  Name: [name] [name] MRN: [id number] Date of Birth: 1965/09/15  Today's Date: 03/31/2017 PT Individual Time: 5520-8022 PT Individual Time Calculation (min): 70 min   Short Term Goals: Week 1:  PT Short Term Goal 1 (Week 1): Pt will perform bed to chair transfer with min A x 1 and LRAD in 1 min or less. PT Short Term Goal 2 (Week 1): Pt will ambulate 20 ft with LRAD and min A. PT Short Term Goal 3 (Week 1): Pt will perform supine to sit and return with supervision.  Skilled Therapeutic Interventions/Progress Updates:    session focused on functional transfers and gait in the room navigating to sink to brush teeth. Requires tactile cues for initiation and pt continually c/o being tired throughout session. Pt able to verbalize more today during session and demonstrate improved ability to follow and understand commands given by therapist. Min assist overall for transfers and mobility without AD requiring some +2 assist for gait in hallway to assist with guiding and encouragement. Follow routine set up by primary PT of once leaving room performing stairs in stairwell, simulated car transfer, ramp and mulch negotiation for community mobility and balance, and navigating through ADL apartment and down hallway. Require +2 for sfaety on stairs with overall min physical assist with max cues for technique, hand placement, and safety due to blindness. Pt with tendency for maintaining head turned to R due to L inattention and pt noted some increased neck pain today. Pt demonstrated improved ability to perform car transfer and ramp/mulch negotiation compared to yesterday requiring mod cues to identify self in space. Rest breaks given throughout due to pt more often reporting fatigue. Min assist for furniture transfer to low couch in ADL apartment with cues due to visual impairments and tactile cues for initiation. Performed UE ergometer in standing position for  bimanual task and functional strengthening with cues for attention to LUE placement on handle and to change directions. Pt request to sit, took seated break and self selected to continue activity for a few min in seated position. Neuro re-ed on [employer] for postural control and balance re-training on compliant surface level 8. Encouraged pt to perform anterior/posterior, lateral and circular movements using BUE support. Pt able to complete successfully except for counterclockwise circle due to decreased ability to motor plan initiating movement to the L. Functional gait back to room with min assist +2 as described above and returned to supine with supervision  Therapy Documentation Precautions:  Precautions Precautions: Fall, Other (comment) Precaution Comments: blind, global aphasia Restrictions Weight Bearing Restrictions: No Pain: Reports neck pain - repositioned as able.    See Function Navigator for Current Functional Status.   Therapy/Group: Individual Therapy  [name], PT, DPT  03/31/2017, 12:16 PM

## 2017-03-31 NOTE — Progress Notes (Signed)
Speech Language Pathology Weekly Progress and Session Note  Patient Details  Name: [name] [name] MRN: [id number] Date of Birth: Mar 18, 1965  Beginning of progress report period:  March 24, 2017  End of progress report period: March 31, 2017   Today's Date: 03/31/2017 SLP Individual Time: 1400-1500 SLP Individual Time Calculation (min): 60 min  Short Term Goals: Week 1: SLP Short Term Goal 1 (Week 1): Pt will verbally express wants/needs with in 50% of opportunties with Mod A multimodal cues.  SLP Short Term Goal 1 - Progress (Week 1): Progressing toward goal SLP Short Term Goal 2 (Week 1): Pt will answer basic yes/no questions with in 50% of opportunties with Mod A multimdoal cues. SLP Short Term Goal 2 - Progress (Week 1): Progressing toward goal SLP Short Term Goal 3 (Week 1): Pt will follow 1 step commands with Mod A multimodal cues.  SLP Short Term Goal 3 - Progress (Week 1): Met SLP Short Term Goal 4 (Week 1): Pt will verbally express rote/automatic verbal sequences with Max A multimodal cues.  SLP Short Term Goal 4 - Progress (Week 1): Met SLP Short Term Goal 5 (Week 1): Pt will utilize call bell to request assistance with Max A multimodal cues.  SLP Short Term Goal 5 - Progress (Week 1): Met SLP Short Term Goal 6 (Week 1): Pt will consume regular diet given hand over hand assistance without overt s/s aspiration. SLP Short Term Goal 6 - Progress (Week 1): Met    New Short Term Goals: Week 2: SLP Short Term Goal 1 (Week 2): Pt will verbally express wants/needs with in 50% of opportunties with Mod A multimodal cues.  SLP Short Term Goal 2 (Week 2): Pt will answer basic yes/no questions with in 50% of opportunties with Mod A multimdoal cues. SLP Short Term Goal 3 (Week 2): Pt will follow 1 step commands with Min A multimodal cues.  SLP Short Term Goal 4 (Week 2): Pt will verbally express rote/automatic verbal sequences with Mod A multimodal cues.  SLP Short Term Goal 5 (Week  2): Pt will utilize call bell to request assistance with Mod A multimodal cues.   Weekly Progress Updates:   Pt has made functional gains this reporting period and has met 4 out of 6 short term goals.  Pt is currently mod-max assist for tasks due to global aphasia.  Pt has demonstrated improved spontaneity of verbal expression and his responses are becoming more pertinent to therapist's questions; however, he continues to need heavy cues during both structured and unstructured tasks.  Pt is consuming a regular diet and thin liquids with mod I use of swallowing precautions; as a result, no further ST needs are indicated for dysphagia.  No family has been present yet for education but training is scheduled for over the weekend.  Pt would continue to benefit from skilled ST while inpatient in order to maximize functional independence and reduce burden of care prior to discharge.  Anticipate that pt will need 24/7 supervision at discharge in addition to intensive ST follow up at next level of care.  Intensity: Minumum of 1-2 x/day, 30 to 90 minutes Frequency: 3 to 5 out of 7 days Duration/Length of Stay: 3 weeks  Treatment/Interventions: Cognitive remediation/compensation;Cueing hierarchy;Dysphagia/aspiration precaution training;Functional tasks;Patient/family education   Daily Session  Skilled Therapeutic Interventions: Pt was seen for skilled ST targeting cognitive-linguistic goals.  Pt was asleep upon arrival but awakened easily and came to sitting position from supine with mod assist multimodal cues  for initiation.  Pt's lunch tray was untouched upon arrival so therapist helped pt set up his meal tray.  He could convey his preference for drink and sandwich toppings with mod-max assist verbal cues. Pt also could express that he had a challenging PT session earlier this morning and gestured appropriately to communicate activities of therapy.   Pt could follow 1-step commands with mod assist multimodal  cues and he could generate the opposite of a targeted word for ~60% accuracy with max assist multimodal cues.  Upon return to room, pt did not indicate he needed to go the bathroom but instead attempted to pull down his pants while standing in front of his bed.  Therapist redirected pt to bathroom, where pt requested a urinal.  Pt successfully voided in urinal and was assisted back to bed with max cues for wayfinding due to motor planning and visual deficits. Pt was left in bed with bed alarm set and call bell within reach. Goals updated on this date to reflect current progress and plan of care.           Function:   Eating Eating     Eating Assist Level: More than reasonable amount of time;Set up assist for;Supervision or verbal cues   Eating Set Up Assist For: Opening containers       Cognition Comprehension Comprehension assist level: Understands basic 25 - 49% of the time/ requires cueing 50 - 75% of the time  Expression   Expression assist level: Expresses basic 25 - 49% of the time/requires cueing 50 - 75% of the time. Uses single words/gestures.  Social Interaction Social Interaction assist level: Interacts appropriately 50 - 74% of the time - May be physically or verbally inappropriate.  Problem Solving Problem solving assist level: Solves basic 25 - 49% of the time - needs direction more than half the time to initiate, plan or complete simple activities  Memory Memory assist level: Recognizes or recalls 25 - 49% of the time/requires cueing 50 - 75% of the time   General    Pain Pain Assessment Pain Assessment: No/denies pain Pain Score: 0-No pain  Therapy/Group: Individual Therapy  Tiffanye Hartmann, Selinda Orion 03/31/2017, 12:24 PM

## 2017-03-31 NOTE — Progress Notes (Signed)
[place] PHYSICAL MEDICINE & REHABILITATION     PROGRESS NOTE    Subjective/Complaints: Patient seen lying in bed this morning. No reported issues overnight. He slept well.  ROS: Ltd. due to cognition  Objective: Vital Signs: Blood pressure 122/72, pulse (!) 55, temperature 98.4 F (36.9 C), temperature source Oral, resp. rate 18, weight 108.3 kg (238 lb 12.1 oz), SpO2 96 %. No results found. No results for input(s): WBC, HGB, HCT, PLT in the last 72 hours. Recent Labs    03/31/17 0623  CREATININE 1.17   CBG (last 3)  No results for input(s): GLUCAP in the last 72 hours.  Wt Readings from Last 3 Encounters:  03/30/17 108.3 kg (238 lb 12.1 oz)  03/24/17 100 kg (220 lb 7.4 oz)  03/20/17 99.8 kg (220 lb)    Physical Exam:  Constitutional: He appears well-developed and well-nourished. No acute distress.  HENT: Normocephalic and atraumatic.  Eyes: No discharge. Cardiovascular: RRR. No JVD    Respiratory: CTA Bilaterally. Normal effort  Abdominal. +bowel sounds. Non distended Musc: No edema, no tenderness. Neurological. Alert Global aphasia, verbal apraxia.    Motor: Limited due to participation, but moving all extremities spontaneously (unchanged) Skin. Warm and dry. Intact. Psych: Unable to assess due to mentation (unchanged)  Assessment/Plan: 1. Functional deficits  secondary to right MCA infarct which require 3+ hours per day of interdisciplinary therapy in a comprehensive inpatient rehab setting. Physiatrist is providing close team supervision and 24 hour management of active medical problems listed below. Physiatrist and rehab team continue to assess barriers to discharge/monitor patient progress toward functional and medical goals.  Function:  Bathing Bathing position Bathing activity did not occur: N/A Position: Shower  Bathing parts Body parts bathed by patient: Right arm, Chest, Abdomen, Front perineal area, Buttocks, Right upper leg, Left upper leg, Right  lower leg, Left lower leg, Left arm Body parts bathed by helper: Back  Bathing assist Assist Level: Touching or steadying assistance(Pt > 75%)      Upper Body Dressing/Undressing Upper body dressing   What is the patient wearing?: Pull over shirt/dress     Pull over shirt/dress - Perfomed by patient: Put head through opening, Pull shirt over trunk, Thread/unthread right sleeve, Thread/unthread left sleeve Pull over shirt/dress - Perfomed by helper: Thread/unthread left sleeve        Upper body assist Assist Level: Supervision or verbal cues      Lower Body Dressing/Undressing Lower body dressing   What is the patient wearing?: Shoes Underwear - Performed by patient: Pull underwear up/down   Pants- Performed by patient: Thread/unthread right pants leg, Thread/unthread left pants leg, Pull pants up/down, Fasten/unfasten pants Pants- Performed by helper: Thread/unthread right pants leg, Thread/unthread left pants leg   Non-skid slipper socks- Performed by helper: Don/doff right sock, Don/doff left sock     Shoes - Performed by patient: Don/doff right shoe, Don/doff left shoe, Fasten right Shoes - Performed by helper: Fasten left       TED Hose - Performed by helper: Don/doff right TED hose, Don/doff left TED hose  Lower body assist Assist for lower body dressing: Touching or steadying assistance (Pt > 75%)      Toileting Toileting   Toileting steps completed by patient: Adjust clothing prior to toileting, Performs perineal hygiene, Adjust clothing after toileting Toileting steps completed by helper: Adjust clothing after toileting Toileting Assistive Devices: Toilet aid  Toileting assist Assist level: More than reasonable time, Set up/obtain supplies, Supervision or verbal cues  Transfers Chair/bed transfer   Chair/bed transfer method: Stand pivot, Ambulatory Chair/bed transfer assist level: Touching or steadying assistance (Pt > 75%) Chair/bed transfer assistive  device: Armrests     Locomotion Ambulation     Max distance: 27' Assist level: 2 helpers(2nd helper for safety)   Wheelchair Wheelchair activity did not occur: Safety/medical concerns(Agitation)   Max wheelchair distance: 50 ft Assist Level: Touching or steadying assistance (Pt > 75%)(B LEs)  Cognition Comprehension Comprehension assist level: Understands basic 25 - 49% of the time/ requires cueing 50 - 75% of the time  Expression Expression assist level: Expresses basic 25 - 49% of the time/requires cueing 50 - 75% of the time. Uses single words/gestures.  Social Interaction Social Interaction assist level: Interacts appropriately 50 - 74% of the time - May be physically or verbally inappropriate.  Problem Solving Problem solving assist level: Solves basic 25 - 49% of the time - needs direction more than half the time to initiate, plan or complete simple activities  Memory Memory assist level: Recognizes or recalls less than 25% of the time/requires cueing greater than 75% of the time   Medical Problem List and Plan:  1. 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 Status post loop recorder    Cont CIR 2. DVT Prophylaxis/Anticoagulation: Subcutaneous Lovenox. Monitor for any bleeding episodes.    Venous Doppler studies negative  3. Headaches/pain Management:   Fioricet as needed    -continue Tylenol every 6 hours as needed. 4. Mood: LCSW to follow for evaluation and support.  5. Neuropsych: This patient is not capable of making decisions on his own behalf.  6. Skin/Wound Care: routine pressure relief measures.  7. Fluids/Electrolytes/Nutrition: Monitor I/O.    BMP within acceptable range on 2/18, creatinine continues to trend up   Encourage fluids, may require IVF   Labs ordered for Monday   8. HTN: Lopressor 12.5 mg twice a day    Controlled on 2/22 9. Dyslipidemia: Lipitor  10. History of rectal cancer with resection and chemotherapy.   Follow-up outpatient     Added Imodium for loose stools, encouraged use   Added probiotic as well to help with stool consistency   Fiber added on 2/20   Appears to be improving 11. Blindness.  Limited vision if any   LOS (Days) 7 A FACE TO FACE EVALUATION WAS PERFORMED  Ankit Lorie Phenix, MD 03/31/2017 8:09 AM

## 2017-03-31 NOTE — Progress Notes (Signed)
Occupational Therapy Weekly Progress Note  Patient Details  Name: [name] [name] MRN: [id number] Date of Birth: [birth year]  Beginning of progress report period: March 25, 2017 End of progress report period: March 31, 2017  Today's Date: 03/31/2017 OT Individual Time: 1287-8676 OT Individual Time Calculation (min): 54 min    Patient has met 3 of 3 short term goals.  [name] [name] is making steady progress with OT at this time.  He continues to need min assist for transfers stand pivot as well as min to mod assistance for mobility when ambulating to the bathroom, shower, and sink with hand held assistance.  He needs mod demonstrational cueing to sequence through bathing tasks for thoroughness and min demonstrational cueing for dressing.  He still demonstrates some left neglect but uses the LUE spontaneously for selfcare tasks at a slightly diminished level.  He still exhibits global aphasia so instructional cues are usually not enough to get him to initiate a task, and demonstrational cueing is usually warranted.  Feel he is on target for min- supervision level goals for discharge home with family education planned for this weekend and next week. Will continue with current OT POC with expected discharge 3/1.    Patient continues to demonstrate the following deficits: decreased coordination, decreased visual acuity, decreased attention to left and left side neglect, decreased initiation, decreased awareness, decreased problem solving, decreased memory and delayed processing and decreased standing balance and decreased balance strategies and therefore will continue to benefit from skilled OT intervention to enhance overall performance with BADL and Reduce care partner burden.  Patient progressing toward long term goals..  Continue plan of care.  OT Short Term Goals Week 2:  OT Short Term Goal 1 (Week 2): Pt will continue working on established supervision to min assist level goals for discharge.     Skilled Therapeutic Interventions/Progress Updates:    Pt completed bathing and dressing during session.  He was able to transfer to the EOB with supervision and then ambulated to the shower seat with mod assist.  Once he was in the shower he completed bathing with min assist and mod demonstrational cueing.  He was able to dry off and then ambulate out to the EOB with mod assist for balance.  He then completed dressing with min assist sit to stand on the EOB.  Max assist for TEDs and max assist for tying his right shoe.  He was able to tie the left on his first attempt.  Finished session with pt in the bed with bed alarm in place and call button and phone in reach.    Therapy Documentation Precautions:  Precautions Precautions: Fall, Other (comment) Precaution Comments: blind, global aphasia Restrictions Weight Bearing Restrictions: No  Pain: Pain Assessment Pain Assessment: No/denies pain Pain Score: 0-No pain ADL: See Function Navigator for Current Functional Status.   Therapy/Group: Individual Therapy  [name] [name] OTR/L 03/31/2017, 12:16 PM

## 2017-04-01 ENCOUNTER — Ambulatory Visit (HOSPITAL_COMMUNITY): Payer: Medicaid Other | Admitting: Physical Therapy

## 2017-04-01 ENCOUNTER — Encounter (HOSPITAL_COMMUNITY): Payer: Medicaid Other | Admitting: Occupational Therapy

## 2017-04-01 NOTE — Progress Notes (Signed)
Occupational Therapy Session Note  Patient Details  Name: [name] [name] MRN: [id number] Date of Birth: December 07, 1965  Today's Date: 04/01/2017 OT Individual Time: 1003-1046 OT Individual Time Calculation (min): 43 min    Short Term Goals: Week 2:  OT Short Term Goal 1 (Week 2): Pt will continue working on established supervision to min assist level goals for discharge.    Skilled Therapeutic Interventions/Progress Updates:    Pt completed toileting, showering, and dressing during session.  He was able to complete transfer to the toilet with min hand held assistance.  He completed toilet hygiene with supervision in sitting but needed min assist for managing clothing.  Next had him transition over to the shower seat in the walk-in shower.  He was able to complete bathing with max demonstrational cueing for thoroughness and min assist for balance.  He needed mod demonstrational cueing for drying off thoroughly as well as he did not attempt to dry off his LEs.  Had him transfer to the EOB for dressing.  He was able to donn pullover shirt and pull up sweat pants, but needed assist with tying them.  Therapist assisted with TEDs and he was able to donn shoes but made no attempt to tie them.  Therapist assisted with tying secondary to decreased time.  Pt left in bed with bed alarm in place and call button and phone in reach.    Therapy Documentation Precautions:  Precautions Precautions: Fall, Other (comment) Precaution Comments: blind, global aphasia Restrictions Weight Bearing Restrictions: No  Pain: Pain Assessment Pain Assessment: No/denies pain Pain Score: Asleep ADL: See Function Navigator for Current Functional Status.   Therapy/Group: Individual Therapy  Nyema Hachey OTR/L 04/01/2017, 12:31 PM

## 2017-04-01 NOTE — Progress Notes (Signed)
Physical Therapy Weekly Progress Note  Patient Details  Name: [name] [name] MRN: [id number] Date of Birth: [birth year]  Beginning of progress report period: March 25, 2017 End of progress report period: April 01, 2017  Today's Date: 04/01/2017 PT Individual Time: 1100-1155 PT Individual Time Calculation (min): 55 min   Patient has met 3 of 3 short term goals. Pt has improved in all aspects of functional mobility over the last week and is consistently transferring and ambulating 150-200' w/ min assist, ambulating on uneven surfaces, and negotiating 10 steps w/ min assist +2 for safety. Physically, he is primarily limited by impaired balance and endurance. However, he requires min-mod cues for safe mobility 2/2 pre-existing blindness and cognitive deficits listed below. He has required less verbal/tactile/manual cues over the last few days with having a therapy "routine" to assist w/ learning new environment and orientation.   Patient continues to demonstrate the following deficits muscle weakness, decreased cardiorespiratoy endurance, decreased coordination and decreased motor planning, decreased visual acuity and decreased visual perceptual skills, decreased attention to left, decreased initiation, decreased attention, decreased awareness, decreased problem solving, decreased safety awareness, decreased memory and delayed processing and decreased standing balance and decreased balance strategies and therefore will continue to benefit from skilled PT intervention to increase functional independence with mobility.  Patient progressing toward long term goals..  Continue plan of care.  PT Short Term Goals Week 1:  PT Short Term Goal 1 (Week 1): Pt will perform bed to chair transfer with min A x 1 and LRAD in 1 min or less. PT Short Term Goal 1 - Progress (Week 1): Met PT Short Term Goal 2 (Week 1): Pt will ambulate 20 ft with LRAD and min A. PT Short Term Goal 2 - Progress (Week 1): Met PT  Short Term Goal 3 (Week 1): Pt will perform supine to sit and return with supervision. PT Short Term Goal 3 - Progress (Week 1): Met Week 2:  PT Short Term Goal 1 (Week 2): =LTGs due to ELOS  Skilled Therapeutic Interventions/Progress Updates:   Pt in supine and agreeable to therapy, no c/o pain. Mother and girlfriend present this session to discuss PLOF and CLOF as they have not been present over the last week. Both confirm that pt was independent around the home including ADLs and cooking. He only needed assistance when leaving the house and that included verbal cues only for direction and orienting to environment. Girlfriend states he is discharging to her house, 1 level w/ 3 steps to enter (no rails). Educated both on [name] and expectation that pt will need 24/7 assist at discharge 2/2 blindness, balance deficits, and cognitive deficits. Both verbalized understanding and in agreement. Girlfriend observed ambulation, car transfer, negotiating uneven surfaces and ramp, and transfers in ADL apartment. Deferred stairs this session as skilled 2nd helper was not available for safety. Pt performed all mobility w/ unilateral HHA and min guard from therapist for balance. Min-mod verbal/tactile/manual cues for direction-changing, reaching for environmental cues including rail and car seat, and for obstacle avoidance. Overall, improved from previous sessions with this therapist. 2 brief seated rest breaks throughout session 2/2 fatigue. Returned to room and ended session sitting EOB and in care of family. Both in agreement to call RN prior to leaving room to reset bed alarm as pt requires supervision for sitting balance. All needs met.   Therapy Documentation Precautions:  Precautions Precautions: Fall, Other (comment) Precaution Comments: blind, global aphasia Restrictions Weight Bearing Restrictions: No Pain: Pain Assessment  Pain Score: Asleep Faces Pain Scale: Hurts a little bit Pain Type: Acute  pain Pain Location: Head Pain Descriptors / Indicators: Aching Pain Onset: Gradual Pain Intervention(s): Medication (See eMAR)  See Function Navigator for Current Functional Status.  Therapy/Group: Individual Therapy  [name] [name] K Arnette 04/01/2017, 12:03 PM

## 2017-04-01 NOTE — Progress Notes (Signed)
  [place] PHYSICAL MEDICINE & REHABILITATION     PROGRESS NOTE    Subjective/Complaints: Patient denies pain.  States he slept well.  No other complaints.  Objective: Vital Signs: Blood pressure (!) 141/80, pulse (!) 46, temperature 98.3 F (36.8 C), temperature source Oral, resp. rate 16, weight 238 lb 12.1 oz (108.3 kg), SpO2 96 %.   Physical Exam:   well-developed well-nourished male in no acute distress. HEENT exam atraumatic, normocephalic, neck supple without jugular venous distention. Chest clear to auscultation cardiac exam S1-S2 are regular. Abdominal exam overweight with bowel sounds, soft and nontender. Extremities no edema. Neurologic exam is alert  cogntion poor  Assessment/Plan: 1. Functional deficits  secondary to right MCA infarct  Medical Problem List and Plan:  1. 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 Status post loop recorder    Cont CIR 2. DVT Prophylaxis/Anticoagulation: Subcutaneous Lovenox. Monitor for any bleeding episodes.    Venous Doppler studies negative  3. Headaches/pain Management: Patient denies headaches this morning.  Continue current medications. 4. Mood: LCSW to follow for evaluation and support.  5. Neuropsych: This patient is not capable of making decisions on his own behalf.  6. Skin/Wound Care: routine pressure relief measures.  7. Fluids/Electrolytes/Nutrition: Monitor I/O.     Basic Metabolic Panel:    Component Value Date/Time   NA 139 03/27/2017 1315   NA 139 06/17/2015 1057   NA 135 (L) 02/16/2013 0957   K 4.3 03/27/2017 1315   K 3.1 (L) 02/16/2013 0957   CL 107 03/27/2017 1315   CL 104 02/16/2013 0957   CO2 23 03/27/2017 1315   CO2 25 02/16/2013 0957   BUN 12 03/27/2017 1315   BUN 10 06/17/2015 1057   BUN 16 02/16/2013 0957   CREATININE 1.17 03/31/2017 0623   CREATININE 1.29 02/16/2013 0957   GLUCOSE 102 (H) 03/27/2017 1315   GLUCOSE 147 (H) 02/16/2013 0957   CALCIUM 9.1 03/27/2017 1315   CALCIUM 9.0 02/16/2013 0957      Lab Results  Component Value Date   CREATININE 1.17 03/31/2017   CREATININE 1.02 03/27/2017   CREATININE 0.99 03/24/2017   8. HTN: Lopressor 12.5 mg twice a day    131/75-150/85 9. Dyslipidemia: Lipitor  10. History of rectal cancer with resection and chemotherapy. Constipation resolved. 11. Blindness.  Limited vision if any   LOS (Days) 8 A FACE TO FACE EVALUATION WAS PERFORMED  Lisabeth Pick, MD 04/01/2017 6:27 AM

## 2017-04-02 ENCOUNTER — Inpatient Hospital Stay (HOSPITAL_COMMUNITY): Payer: Medicaid Other

## 2017-04-02 MED ORDER — METOPROLOL TARTRATE 12.5 MG HALF TABLET
12.5000 mg | ORAL_TABLET | Freq: Every day | ORAL | Status: DC
  Administered 2017-04-03 – 2017-04-07 (×5): 12.5 mg via ORAL
  Filled 2017-04-02 (×5): qty 1

## 2017-04-02 NOTE — Progress Notes (Signed)
  [facility]ILIT[facility]     PROGRESS NOTE    Subjective/Complaints: Patient feels well this morning.  He slept well.  He denies any pain.  Objective: Vital Signs: Blood pressure 138/78, pulse (!) 54, temperature 97.8 F (36.6 C), temperature source Oral, resp. rate 18, weight 238 lb 12.1 oz (108.3 kg), SpO2 100 %.   Physical Exam:   well-developed well-nourished male in no acute distress. HEENT exam atraumatic, normocephalic, neck supple without jugular venous distention. Chest clear to auscultation cardiac exam S1-S2 are regular. Abdominal exam overweight with bowel sounds, soft and nontender. Extremities no edema. Neurologic exam is alert cogntion poor (answers yes no questions but difficult for him to explain further)  Assessment/Plan: 1. Functional deficits  secondary to right MCA infarct  Medical Problem List and Plan:  1. 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 Status post loop recorder    Cont CIR 2. DVT Prophylaxis/Anticoagulation: Subcutaneous Lovenox. Monitor for any bleeding episodes.    Venous Doppler studies negative  3. Headaches/pain Management: Patient denies headaches 2.24.19.  Continue current medications. 4. Mood: LCSW to follow for evaluation and support.  5. Neuropsych: This patient is not capable of making decisions on his own behalf.  6. Skin/Wound Care: routine pressure relief measures.  7. Fluids/Electrolytes/Nutrition: Monitor I/O.         Lab Results  Component Value Date   CREATININE 1.17 03/31/2017   CREATININE 1.02 03/27/2017   CREATININE 0.99 03/24/2017   8. HTN: Lopressor 12.5 mg twice a day    131/75-150/85 Pulse rate is low.  We will decrease Lopressor to once daily. 9. Dyslipidemia: Lipitor  10. History of rectal cancer with resection and chemotherapy. Constipation resolved. 11. Blindness.  Limited vision if any   LOS (Days) 9 A FACE TO FACE EVALUATION WAS PERFORMED  Lisabeth Pick,  MD 04/02/2017 9:30 AM

## 2017-04-02 NOTE — Progress Notes (Signed)
Physical Therapy Session Note  Patient Details  Name: [name] [name] MRN: [id number] Date of Birth: Aug 08, 1965  Today's Date: 04/02/2017 PT Individual Time: 7681-1572 PT Individual Time Calculation (min): 44 min   Short Term Goals: Week 2:  PT Short Term Goal 1 (Week 2): =LTGs due to ELOS  Skilled Therapeutic Interventions/Progress Updates:    Pt supine in bed upon PT arrival, agreeable to therapy tx and denies pain. Pt transferred supine>sitting with supervision, pt performed sit<>stand with min assist and utilized urinal with set up assist. Pt ambulated from room>ortho gym>gym>room this session. Pt performed all mobility w/ unilateral HHA and min guard from therapist for balance. Min-mod verbal/tactile/manual cues for direction-changing, reaching for environmental cues including rail, doorways, and for obstacle avoidance. Overall, improved from previous sessions with this therapist. 2 brief seated rest breaks throughout session 2/2 fatigue. During seated rest break therapist performed L rotation cervical stretches and encouraged pt to actively rotate L as pt keeps his head rotated to the R during all mobility. Returned to room and ended session with pt supine in bed, alarm set.     Therapy Documentation Precautions:  Precautions Precautions: Fall, Other (comment) Precaution Comments: blind, global aphasia Restrictions Weight Bearing Restrictions: No   See Function Navigator for Current Functional Status.   Therapy/Group: Individual Therapy  Netta Corrigan, PT, DPT 04/02/2017, 8:03 AM

## 2017-04-03 ENCOUNTER — Inpatient Hospital Stay (HOSPITAL_COMMUNITY): Payer: Medicaid Other | Admitting: Speech Pathology

## 2017-04-03 ENCOUNTER — Other Ambulatory Visit: Payer: Self-pay

## 2017-04-03 ENCOUNTER — Inpatient Hospital Stay (HOSPITAL_COMMUNITY): Payer: Medicaid Other

## 2017-04-03 LAB — BASIC METABOLIC PANEL
Anion gap: 9 (ref 5–15)
BUN: 15 mg/dL (ref 6–20)
CALCIUM: 8.9 mg/dL (ref 8.9–10.3)
CO2: 24 mmol/L (ref 22–32)
Chloride: 105 mmol/L (ref 101–111)
Creatinine, Ser: 1.01 mg/dL (ref 0.61–1.24)
GFR calc Af Amer: >60 mL/min (ref >60)
Glucose, Bld: 93 mg/dL (ref 65–99)
Potassium: 3.9 mmol/L (ref 3.5–5.1)
Sodium: 138 mmol/L (ref 135–145)

## 2017-04-03 LAB — CBC WITH DIFFERENTIAL/PLATELET
BASOS ABS: 0.1 10*3/uL (ref 0.0–0.1)
BASOS PCT: 1 %
EOS PCT: 4 %
Eosinophils Absolute: 0.2 10*3/uL (ref 0.0–0.7)
HCT: 42 % (ref 39.0–52.0)
Hemoglobin: 14 g/dL (ref 13.0–17.0)
LYMPHS PCT: 27 %
Lymphs Abs: 1.3 10*3/uL (ref 0.7–4.0)
MCH: 29.4 pg (ref 26.0–34.0)
MCHC: 33.3 g/dL (ref 30.0–36.0)
MCV: 88.2 fL (ref 78.0–100.0)
MONO ABS: 0.3 10*3/uL (ref 0.1–1.0)
MONOS PCT: 7 %
Neutro Abs: 2.9 10*3/uL (ref 1.7–7.7)
Neutrophils Relative %: 61 %
PLATELETS: 207 10*3/uL (ref 150–400)
RBC: 4.76 MIL/uL (ref 4.22–5.81)
RDW: 14.6 % (ref 11.5–15.5)
WBC: 4.8 10*3/uL (ref 4.0–10.5)

## 2017-04-03 NOTE — Progress Notes (Signed)
[name] [name] plans for discharge to home 04/07/2017. Cardiology services to follow-up for placement of loop recorder day of discharge to be completed as outpatient

## 2017-04-03 NOTE — Progress Notes (Deleted)
Mr. normal be discharged from rehabilitation services 04/07/2017. Cardiology services plans to place loop recorder as outpatient day of discharge from rehabilitation services

## 2017-04-03 NOTE — Progress Notes (Signed)
Physical Therapy Session Note  Patient Details  Name: [name] [name] MRN: [id number] Date of Birth: November 06, 1965  Today's Date: 04/03/2017 PT Individual Time: 1101-1156, 1415-1445 PT Individual Time Calculation (min): 55 min , 30 min Missed time: 15 minutes  Short Term Goals: Week 2:  PT Short Term Goal 1 (Week 2): =LTGs due to ELOS  Skilled Therapeutic Interventions/Progress Updates:    Session 1:Pt supine in bed upon PT arrival, agreeable to therapy tx and denies pain. Pt transferred supine>sitting EOB with supervision and verbal cues for sequencing. Pt performed sit<>stand and ambulated out of his room with HHA, min assist. Session focused on ambulation through the entire unit multiple 200-400 ft bouts while following directions/commands, room>ortho gym>rehab apartment>dayroom>gym>ortho gym>pts room. Min-mod verbal/tactile/manual cues for direction-changing, reaching for environmental cues including rail, doorways, and for obstacle avoidance. Verbal and tactile cues for pt to scan to L side, increasing L cervical rotation. During ambulation worked on reaching for objects such as car, bed, rails, chairs, table while working on naming these items. Pt left supine in bed at end of session with needs in reach.   Session 2: Pt supine in bed upon PT arrival, agreeable to therapy tx and denies pain. Pt transferred supine>sitting EOB with supervision and verbal cues for sequencing. Pt performed sit<>stand and ambulated out of his room with HHA, min assist. Session focused on ambulation through the unit while following directions/commands. Min-mod verbal/tactile/manual cues for direction-changing, reaching for environmental cues including rail, doorways, and for obstacle avoidance.Pt ambulated to ortho gym and became agitated when attempting to navigate ramp, pt started raising his voice but the therapist was unable to understand what the pt was saying. Pt ambulated to the gym and took a seated rest break on  the mat, pt with poor participation and no longer responding to therapist. Pt ambulated back to room and left supine in bed at end of session with needs in reach. Pt missed 15 minutes of therapy this session secondary to decreased participation and increased agitation.    Therapy Documentation Precautions:  Precautions Precautions: Fall, Other (comment) Precaution Comments: blind, global aphasia Restrictions Weight Bearing Restrictions: No   See Function Navigator for Current Functional Status.   Therapy/Group: Individual Therapy  Netta Corrigan, PT, DPT 04/03/2017, 7:50 AM

## 2017-04-03 NOTE — Progress Notes (Signed)
[facility]IL[facility]     PROGRESS NOTE    Subjective/Complaints: Patient seen lying in bed this morning.  No reported issues overnight or to the weekend.  ROS: Limited due to cognition  Objective: Vital Signs: Blood pressure 123/79, pulse 62, temperature 97.9 F (36.6 C), temperature source Oral, resp. rate 18, weight 108.3 kg (238 lb 12.1 oz), SpO2 100 %. No results found. No results for input(s): WBC, HGB, HCT, PLT in the last 72 hours. No results for input(s): NA, K, CL, GLUCOSE, BUN, CREATININE, CALCIUM in the last 72 hours.  Invalid input(s): CO CBG (last 3)  No results for input(s): GLUCAP in the last 72 hours.  Wt Readings from Last 3 Encounters:  03/30/17 108.3 kg (238 lb 12.1 oz)  03/24/17 100 kg (220 lb 7.4 oz)  03/20/17 99.8 kg (220 lb)    Physical Exam:  Constitutional: He appears well-developed and well-nourished. No acute distress.  HENT: Normocephalic and atraumatic.  Eyes: No discharge. Cardiovascular: RRR. No JVD    Respiratory: CTA Bilaterally.  Normal effort  Abdominal. +bowel sounds. Non distended Musc: No edema, no tenderness. Neurological. Alert Global aphasia, verbal apraxia.    Motor: Limited due to participation, but moving all extremities spontaneously (stable) Skin. Warm and dry. Intact. Psych: Unable to assess due to mentation (unchanged)  Assessment/Plan: 1. Functional deficits  secondary to right MCA infarct which require 3+ hours per day of interdisciplinary therapy in a comprehensive inpatient rehab setting. Physiatrist is providing close team supervision and 24 hour management of active medical problems listed below. Physiatrist and rehab team continue to assess barriers to discharge/monitor patient progress toward functional and medical goals.  Function:  Bathing Bathing position Bathing activity did not occur: N/A Position: Shower  Bathing parts Body parts bathed by patient: Right arm, Chest, Abdomen,  Front perineal area, Buttocks, Right upper leg, Left upper leg, Right lower leg, Left lower leg Body parts bathed by helper: Left arm  Bathing assist Assist Level: Touching or steadying assistance(Pt > 75%)      Upper Body Dressing/Undressing Upper body dressing   What is the patient wearing?: Pull over shirt/dress     Pull over shirt/dress - Perfomed by patient: Put head through opening, Pull shirt over trunk, Thread/unthread right sleeve, Thread/unthread left sleeve Pull over shirt/dress - Perfomed by helper: Thread/unthread left sleeve        Upper body assist Assist Level: Supervision or verbal cues      Lower Body Dressing/Undressing Lower body dressing   What is the patient wearing?: Shoes, [employer], Pants, Non-skid slipper socks Underwear - Performed by patient: Pull underwear up/down   Pants- Performed by patient: Thread/unthread right pants leg, Thread/unthread left pants leg, Pull pants up/down Pants- Performed by helper: Fasten/unfasten pants(tie pants)   Non-skid slipper socks- Performed by helper: Don/doff right sock, Don/doff left sock     Shoes - Performed by patient: Don/doff right shoe, Don/doff left shoe Shoes - Performed by helper: Fasten right, Fasten left       TED Hose - Performed by helper: Don/doff right TED hose, Don/doff left TED hose  Lower body assist Assist for lower body dressing: Touching or steadying assistance (Pt > 75%)      Toileting Toileting   Toileting steps completed by patient: Adjust clothing prior to toileting, Adjust clothing after toileting Toileting steps completed by helper: Performs perineal hygiene Toileting Assistive Devices: Grab bar or rail  Toileting assist Assist level: Touching or steadying assistance (Pt.75%)  Transfers Chair/bed transfer   Chair/bed transfer method: Ambulatory Chair/bed transfer assist level: Touching or steadying assistance (Pt > 75%) Chair/bed transfer assistive device: Armrests      Locomotion Ambulation     Max distance: 150' Assist level: Touching or steadying assistance (Pt > 75%)   Wheelchair Wheelchair activity did not occur: Safety/medical concerns(Agitation)   Max wheelchair distance: 50 ft Assist Level: Touching or steadying assistance (Pt > 75%)(B LEs)  Cognition Comprehension Comprehension assist level: Understands basic 25 - 49% of the time/ requires cueing 50 - 75% of the time  Expression Expression assist level: Expresses basic 25 - 49% of the time/requires cueing 50 - 75% of the time. Uses single words/gestures.  Social Interaction Social Interaction assist level: Interacts appropriately 50 - 74% of the time - May be physically or verbally inappropriate.  Problem Solving Problem solving assist level: Solves basic 25 - 49% of the time - needs direction more than half the time to initiate, plan or complete simple activities  Memory Memory assist level: Recognizes or recalls 25 - 49% of the time/requires cueing 50 - 75% of the time   Medical Problem List and Plan:  1. 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 Status post loop recorder    Cont CIR   Weekend notes reviewed 2. DVT Prophylaxis/Anticoagulation: Subcutaneous Lovenox. Monitor for any bleeding episodes.    Venous Doppler studies negative  3. Headaches/pain Management:   Fioricet as needed    -continue Tylenol every 6 hours as needed. 4. Mood: LCSW to follow for evaluation and support.  5. Neuropsych: This patient is not capable of making decisions on his own behalf.  6. Skin/Wound Care: routine pressure relief measures.  7. Fluids/Electrolytes/Nutrition: Monitor I/O.    BMP within acceptable range on 2/18, creatinine continues to trend up   Encourage fluids, may require IVF   Labs pending  8. HTN: Lopressor 12.5 mg twice a day    Controlled on 2/25 9. Dyslipidemia: Lipitor  10. History of rectal cancer with resection and chemotherapy.   Follow-up outpatient    Added  Imodium for loose stools, encouraged use   Added probiotic as well to help with stool consistency   Fiber added on 2/20   Appears to be improving 11. Blindness.  Limited vision if any   LOS (Days) 10 A FACE TO FACE EVALUATION WAS PERFORMED  Ankit Lorie Phenix, MD 04/03/2017 8:50 AM

## 2017-04-03 NOTE — Progress Notes (Signed)
Occupational Therapy Session Note  Patient Details  Name: [name] [name] MRN: [id number] Date of Birth: 09-07-1965  Today's Date: 04/03/2017 OT Individual Time: 5465-6812 OT Individual Time Calculation (min): 56 min    Short Term Goals: Week 1:  OT Short Term Goal 1 (Week 1): Pt will complete UB dressing with Min A OT Short Term Goal 1 - Progress (Week 1): Met OT Short Term Goal 2 (Week 1): Pt will initiate LB dressing completion when handed necessary items with min vcs  OT Short Term Goal 2 - Progress (Week 1): Met OT Short Term Goal 3 (Week 1): Pt will complete bathing with Min A OT Short Term Goal 3 - Progress (Week 1): Met  Skilled Therapeutic Interventions/Progress Updates:    1:1. Pt agreeable to bathe and dress this morning. Pt ambulates with min HHA and min multimodal cueing for directions. Pt transfers onto toilet to void B&B with increased time and cues for locating toilet paper/wiping. PT transfers to/from TTB with min HHA and VC to duck head under shower curtain rod. Pt bathes at sit to stand level using grab bar for balance with cueing with CGA and no VC today for sequencing only termination of bathing task. Pt dresses EOB with supervision for UB and A for orientation of paper scrub pants and set up of socks/shoes on bed within reach. Pt unable to name clothing on command when prompted. Pt ambulates to sink and stands to brush teeth with set up. Exited session with pt seated in bed, call light in reach and telesitter on.   Therapy Documentation Precautions:  Precautions Precautions: Fall, Other (comment) Precaution Comments: blind, global aphasia Restrictions Weight Bearing Restrictions: No  See Function Navigator for Current Functional Status.   Therapy/Group: Individual Therapy  Tonny Branch 04/03/2017, 9:54 AM

## 2017-04-03 NOTE — Progress Notes (Signed)
Speech Language Pathology Daily Session Note  Patient Details  Name: [name] [name] MRN: [id number] Date of Birth: Nov 23, 1965  Today's Date: 04/03/2017 SLP Individual Time: 1300-1330 SLP Individual Time Calculation (min): 30 min  Short Term Goals: Week 2: SLP Short Term Goal 1 (Week 2): Pt will verbally express wants/needs with in 50% of opportunties with Mod A multimodal cues.  SLP Short Term Goal 2 (Week 2): Pt will answer basic yes/no questions with in 50% of opportunties with Mod A multimdoal cues. SLP Short Term Goal 3 (Week 2): Pt will follow 1 step commands with Min A multimodal cues.  SLP Short Term Goal 4 (Week 2): Pt will verbally express rote/automatic verbal sequences with Mod A multimodal cues.  SLP Short Term Goal 5 (Week 2): Pt will utilize call bell to request assistance with Mod A multimodal cues.   Skilled Therapeutic Interventions: Skilled treatment session focused on communication goals. SLP facilitated session by providing Max A cues with hand over hand to follow simple body related 1 step directions. Pt was left upright in bed with all needs within reach.      Function:    Cognition Comprehension Comprehension assist level: Understands basic 25 - 49% of the time/ requires cueing 50 - 75% of the time  Expression   Expression assist level: Expresses basic 25 - 49% of the time/requires cueing 50 - 75% of the time. Uses single words/gestures.  Social Interaction Social Interaction assist level: Interacts appropriately 25 - 49% of time - Needs frequent redirection.  Problem Solving Problem solving assist level: Solves basic 25 - 49% of the time - needs direction more than half the time to initiate, plan or complete simple activities  Memory Memory assist level: Recognizes or recalls 25 - 49% of the time/requires cueing 50 - 75% of the time    Pain    Therapy/Group: Individual Therapy  [name] 04/03/2017, 2:42 PM

## 2017-04-04 ENCOUNTER — Encounter (HOSPITAL_COMMUNITY): Payer: Medicaid Other | Admitting: Speech Pathology

## 2017-04-04 ENCOUNTER — Inpatient Hospital Stay (HOSPITAL_COMMUNITY): Payer: Medicaid Other | Admitting: Physical Therapy

## 2017-04-04 ENCOUNTER — Inpatient Hospital Stay (HOSPITAL_COMMUNITY): Payer: Medicaid Other

## 2017-04-04 ENCOUNTER — Inpatient Hospital Stay (HOSPITAL_COMMUNITY): Payer: Medicaid Other | Admitting: Occupational Therapy

## 2017-04-04 NOTE — Progress Notes (Signed)
[facility] RE[facility]ILIT[facility]     PROGRESS NOTE    Subjective/Complaints: Pt seen lying in bed this AM.  No reported issues overnight.   ROS: Limited due to cognition.  Objective: Vital Signs: Blood pressure 130/88, pulse (!) 50, temperature 98.1 F (36.7 C), temperature source Oral, resp. rate 18, weight 108.3 kg (238 lb 12.1 oz), SpO2 100 %. No results found. Recent Labs    04/03/17 0800  WBC 4.8  HGB 14.0  HCT 42.0  PLT 207   Recent Labs    04/03/17 0800  NA 138  K 3.9  CL 105  GLUCOSE 93  BUN 15  CREATININE 1.01  CALCIUM 8.9   CBG (last 3)  No results for input(s): GLUCAP in the last 72 hours.  Wt Readings from Last 3 Encounters:  03/30/17 108.3 kg (238 lb 12.1 oz)  03/24/17 100 kg (220 lb 7.4 oz)  03/20/17 99.8 kg (220 lb)    Physical Exam:  Constitutional: He appears well-developed and well-nourished. No acute distress.  HENT: Normocephalic and atraumatic.  Eyes: No discharge. Cardiovascular: RRR. No JVD    Respiratory: CTA Bilaterally.  Normal effort  Abdominal. +bowel sounds. Non distended Musc: No edema, no tenderness. Neurological. Alert Global aphasia, verbal apraxia.    Motor: Limited due to participation, but moving all extremities spontaneously (unchanged) Skin. Warm and dry. Intact. Psych: Unable to assess due to mentation (unchanged)  Assessment/Plan: 1. Functional deficits  secondary to right MCA infarct which require 3+ hours per day of interdisciplinary therapy in a comprehensive inpatient rehab setting. Physiatrist is providing close team supervision and 24 hour management of active medical problems listed below. Physiatrist and rehab team continue to assess barriers to discharge/monitor patient progress toward functional and medical goals.  Function:  Bathing Bathing position Bathing activity did not occur: N/A Position: Shower  Bathing parts Body parts bathed by patient: Right arm, Chest, Abdomen, Front perineal  area, Buttocks, Right upper leg, Left upper leg, Right lower leg, Left lower leg Body parts bathed by helper: Left arm  Bathing assist Assist Level: Touching or steadying assistance(Pt > 75%)      Upper Body Dressing/Undressing Upper body dressing   What is the patient wearing?: Pull over shirt/dress     Pull over shirt/dress - Perfomed by patient: Put head through opening, Pull shirt over trunk, Thread/unthread right sleeve, Thread/unthread left sleeve Pull over shirt/dress - Perfomed by helper: Thread/unthread left sleeve        Upper body assist Assist Level: Supervision or verbal cues      Lower Body Dressing/Undressing Lower body dressing   What is the patient wearing?: Shoes, [employer], Pants, Non-skid slipper socks Underwear - Performed by patient: Pull underwear up/down   Pants- Performed by patient: Thread/unthread right pants leg, Thread/unthread left pants leg, Pull pants up/down Pants- Performed by helper: Fasten/unfasten pants(tie pants)   Non-skid slipper socks- Performed by helper: Don/doff right sock, Don/doff left sock     Shoes - Performed by patient: Don/doff right shoe, Don/doff left shoe Shoes - Performed by helper: Fasten right, Fasten left       TED Hose - Performed by helper: Don/doff right TED hose, Don/doff left TED hose  Lower body assist Assist for lower body dressing: Touching or steadying assistance (Pt > 75%)      Toileting Toileting   Toileting steps completed by patient: Adjust clothing prior to toileting, Adjust clothing after toileting Toileting steps completed by helper: Performs perineal hygiene Toileting Assistive  Devices: Grab bar or Designer, multimedia Assist level: Touching or steadying assistance (Pt.75%)   Transfers Chair/bed transfer   Chair/bed transfer method: Ambulatory Chair/bed transfer assist level: Touching or steadying assistance (Pt > 75%) Chair/bed transfer assistive device: Armrests     Locomotion Ambulation      Max distance: 200 ft Assist level: Touching or steadying assistance (Pt > 75%)   Wheelchair Wheelchair activity did not occur: Safety/medical concerns(Agitation)   Max wheelchair distance: 50 ft Assist Level: Touching or steadying assistance (Pt > 75%)(B LEs)  Cognition Comprehension Comprehension assist level: Understands basic 25 - 49% of the time/ requires cueing 50 - 75% of the time  Expression Expression assist level: Expresses basic 25 - 49% of the time/requires cueing 50 - 75% of the time. Uses single words/gestures.  Social Interaction Social Interaction assist level: Interacts appropriately 25 - 49% of time - Needs frequent redirection.  Problem Solving Problem solving assist level: Solves basic 25 - 49% of the time - needs direction more than half the time to initiate, plan or complete simple activities  Memory Memory assist level: Recognizes or recalls 25 - 49% of the time/requires cueing 50 - 75% of the time   Medical Problem List and Plan:  1. 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 Status post loop recorder    Cont CIR 2. DVT Prophylaxis/Anticoagulation: Subcutaneous Lovenox. Monitor for any bleeding episodes.    Venous Doppler studies negative  3. Headaches/pain Management:   Fioricet as needed    -continue Tylenol every 6 hours as needed. 4. Mood: LCSW to follow for evaluation and support.  5. Neuropsych: This patient is not capable of making decisions on his own behalf.  6. Skin/Wound Care: routine pressure relief measures.  7. Fluids/Electrolytes/Nutrition: Monitor I/O.    BMP within acceptable range on 2/25   Encourage fluids 8. HTN: Lopressor 12.5 mg twice a day    Controlled on 2/26 9. Dyslipidemia: Lipitor  10. History of rectal cancer with resection and chemotherapy.   Follow-up outpatient    Added Imodium for loose stools, encouraged use   Added probiotic as well to help with stool consistency   Fiber added on 2/20   Stable 11.  Blindness.  Limited vision if any  LOS (Days) 11 A FACE TO FACE EVALUATION WAS PERFORMED  Syrianna Schillaci Lorie Phenix, MD 04/04/2017 7:59 AM

## 2017-04-04 NOTE — Progress Notes (Signed)
Physical Therapy Session Note  Patient Details  Name: [name] [name] MRN: [id number] Date of Birth: 1965-10-06  Today's Date: 04/04/2017 PT Individual Time: 1100-1145 PT Individual Time Calculation (min): 45 min  and Today's Date: 04/04/2017 PT Missed Time: 15 Minutes Missed Time Reason: Patient fatigue  Short Term Goals: Week 2:  PT Short Term Goal 1 (Week 2): =LTGs due to ELOS  Skilled Therapeutic Interventions/Progress Updates:   Pt in w/c and agreeable to therapy, no c/o pain. Girlfriend and mother present for family education, mother stayed in room however - girlfriend will be primary caregiver providing 24/7 assist. Pt's girlfriend provided touching assist for all functional mobility throughout session including ambulating 100-150' at a time, negotiating 10 steps w/ R wall assist as per home set up (home has 3 steps instead of 10), performing car transfer, ambulating no uneven surfaces, and furniture transfers. Girlfriend provided correct verbal, tactile, and manual cues to assist w/ safe mobility. Occasional verbal correction from therapist, however no unsafe mobility observed. Educated her on circumstances that may trigger pt's agitation including unfamiliar environments, fatigue, and transitioning from floor to stairs and back again as pt has delayed processing. Pt had a few bouts of increased agitation during session, girlfriend correctly used calm/firm voice and waited for pt to calm back down prior to initiating more movement. Discussed introducing pt to community environments slowly as increased sound and other sensory distractions may disorient and agitate pt, girlfriend verbalized understanding. Discussed having girlfriend talk to children at home regarding pt's CLOF, need for a calm and distraction-free environment, and cognitive deficits. Pt w/ increased fatigue and decreasing response to therapist's and girlfriend's voices. Returned to room for safety and ended session sitting EOB  and in care of family, all needs met. Girlfriend is safe to assist pt w/ in-room mobility at this time.   Therapy Documentation Precautions:  Precautions Precautions: Fall, Other (comment) Precaution Comments: blind, global aphasia Restrictions Weight Bearing Restrictions: No General: PT Amount of Missed Time (min): 15 Minutes PT Missed Treatment Reason: Patient fatigue Pain: Pain Assessment Pain Assessment: No/denies pain Pain Score: 0-No pain  See Function Navigator for Current Functional Status.   Therapy/Group: Individual Therapy  Ailana Cuadrado K Arnette 04/04/2017, 11:44 AM

## 2017-04-04 NOTE — Progress Notes (Signed)
Occupational Therapy Session Note  Patient Details  Name: [name] [name] MRN: [id number] Date of Birth: 15-Oct-1965  Today's Date: 04/04/2017 OT Individual Time: 0901-1000 OT Individual Time Calculation (min): 59 min    Short Term Goals: Week 2:  OT Short Term Goal 1 (Week 2): Pt will continue working on established supervision to min assist level goals for discharge.    Skilled Therapeutic Interventions/Progress Updates:    Pt worked on toileting, bathing, dressing, and grooming during session.  Pt's girlfriend and his mother in for education but did not come in time to see the shower.  He was able to ambulate into the bathroom and transfer to the toilet with min assist.  Once on the toilet he had already had a small BM in the brief.  Worked on toilet hygiene with supervision in sitting and pt using the LUE to clean himself.  Once completed therapist helped guide him to the shower seat where he worked on bathing tasks.  He was able to complete all bathing with min guard assist and max demonstrational cueing to sequence.  He then transferred out to the EOB and worked on dressing with family present.  He was able to complete all UB dressing with setup and then donned brief with max assist for threading it over his feet as this is not a familiar task for pt.  He was then able to pull it up with supervision.  He completed donning pants with setup as well as shoes, but needed max assist for tying as they did not stay tied when he attempted.  Finished session with pt ambulating to the sink to stand for grooming task of brushing his teeth and rinsing out with mouthwash.  Mod demonstrational cueing to complete.  He then transferred to the wheelchair to finish session with family present.  Educated them on pt's need for constant cueing with bathing and dressing tasks, although he completes dressing better than showering.  Discussed the left neglect that is still present and emphasized the need to place items in  his left hand to help him continue initiating use.  Had girlfriend assist with dressing tasks PRN and assist with transfer to and from the sink with grooming tasks as well.   Therapy Documentation Precautions:  Precautions Precautions: Fall, Other (comment) Precaution Comments: blind, global aphasia Restrictions Weight Bearing Restrictions: No  Pain: Pain Assessment Pain Assessment: No/denies pain Pain Score: 0-No pain ADL: See Function Navigator for Current Functional Status.   Therapy/Group: Individual Therapy  Shlonda Dolloff OTR/L 04/04/2017, 12:24 PM

## 2017-04-04 NOTE — Progress Notes (Signed)
Speech Language Pathology Daily Session Note  Patient Details  Name: [name] [name] MRN: [id number] Date of Birth: 1966-01-11  Today's Date: 04/04/2017 SLP Individual Time: 1300-1400 SLP Individual Time Calculation (min): 60 min  Short Term Goals: Week 2: SLP Short Term Goal 1 (Week 2): Pt will verbally express wants/needs with in 50% of opportunties with Mod A multimodal cues.  SLP Short Term Goal 2 (Week 2): Pt will answer basic yes/no questions with in 50% of opportunties with Mod A multimdoal cues. SLP Short Term Goal 3 (Week 2): Pt will follow 1 step commands with Min A multimodal cues.  SLP Short Term Goal 4 (Week 2): Pt will verbally express rote/automatic verbal sequences with Mod A multimodal cues.  SLP Short Term Goal 5 (Week 2): Pt will utilize call bell to request assistance with Mod A multimodal cues.   Skilled Therapeutic Interventions:  Pt was seen for skilled ST targeting family education.  Pt's mom and girlfriend were present for the duration of today's session and were provided with skilled education regarding pt's current goals and progress in therapies.  SLP discussed techniques to maximize pt's functional communications\ to convey needs and wants to caregivers, including allowing pt increased time for communication, providing semantic cues for word finding, asking closed ended question with nominal choices, and verifying the intent of pt's message.  All questions were answered to pt's family's satisfaction at this time.  Continue per current plan of care.      Pain Pain Assessment Pain Assessment: No/denies pain  Therapy/Group: Individual Therapy  [name] [name], Selinda Orion 04/04/2017, 3:40 PM

## 2017-04-04 NOTE — Progress Notes (Signed)
Social Work Patient ID: [name] [name], male   DOB: 14-Feb-1965, 52 y.o.   MRN: [id number]  Met with pt, girlfriend and Mom who are here to go through family training in preparation for discharge Friday. Pt will be going to girlfriend's home where he will receive 24 hr care. Discussed follow up therapies and equipment. All comfortable with the plan for Friday.

## 2017-04-04 NOTE — Progress Notes (Signed)
Physical Therapy Session Note  Patient Details  Name: [name] [name] MRN: [id number] Date of Birth: 16-May-1965  Today's Date: 04/04/2017 PT Individual Time: 1415-1445 PT Individual Time Calculation (min): 30 min   Short Term Goals: Week 2:  PT Short Term Goal 1 (Week 2): =LTGs due to ELOS  Skilled Therapeutic Interventions/Progress Updates:    Pt supine in bed upon PT arrival, denies pain. Pt requiring encouragement for participation and re-orientation to time/situation. Pt transferred supine>sitting EOB with supervision and verbal cues for sequencing. Pt reports L LE tightness, therapist performed figure 4 and hamstring stretches. Pt performed sit<>stand and ambulated out of his room with min assist. Session focused on ambulation through the unit while following directions/commands. Min-mod verbal/tactile/manual cues for direction-changing, reaching for environmental cues including rail, doorways, and for obstacle avoidance.Pt ambulated to ortho gym and took seated rest break and then ambulated back. Pt requiring increased time and cueing in order to get back in bed. Pt left supine in bed with needs in reach.     Therapy Documentation Precautions:  Precautions Precautions: Fall, Other (comment) Precaution Comments: blind, global aphasia Restrictions Weight Bearing Restrictions: No   See Function Navigator for Current Functional Status.   Therapy/Group: Individual Therapy  Netta Corrigan, PT, DPT 04/04/2017, 2:45 PM

## 2017-04-05 ENCOUNTER — Inpatient Hospital Stay (HOSPITAL_COMMUNITY): Payer: Medicaid Other

## 2017-04-05 ENCOUNTER — Inpatient Hospital Stay (HOSPITAL_COMMUNITY): Payer: Medicaid Other | Admitting: Physical Therapy

## 2017-04-05 ENCOUNTER — Inpatient Hospital Stay (HOSPITAL_COMMUNITY): Payer: Medicaid Other | Admitting: Speech Pathology

## 2017-04-05 ENCOUNTER — Inpatient Hospital Stay (HOSPITAL_COMMUNITY): Payer: Medicaid Other | Admitting: Occupational Therapy

## 2017-04-05 DIAGNOSIS — R001 Bradycardia, unspecified: Secondary | ICD-10-CM

## 2017-04-05 NOTE — Progress Notes (Signed)
Social Work Patient ID: [name] [name], male   DOB: 06/21/1965, 51 y.o.   MRN: [id number]  Met with pt after team conference pleased with going home on Friday and mad at his brother for telling him he is going to a  NH. Have arranged equipment and home health follow up. Preparing for discharge Friday.  

## 2017-04-05 NOTE — Patient Care Conference (Signed)
Inpatient RehabilitationTeam Conference and Plan of Care Update Date: 04/05/2017   Time: 10:15 AM    Patient Name: [name] [name]      Medical Record Number: [id number]  Date of Birth: 15-Feb-1965 Sex: Male         Room/Bed: 4M04C/4M04C-01 Payor Info: Payor: MEDICAID [place] / Plan: MEDICAID [place] ACCESS / Product Type: *No Product type* /    Admitting Diagnosis: CVA  Admit Date/Time:  03/24/2017  6:44 PM Admission Comments: No comment available   Primary Diagnosis:  Right middle cerebral artery stroke [facility]) Principal Problem: Right middle cerebral artery stroke [name]  Patient Active Problem List   Diagnosis Date Noted  . Bradycardia   . Elevated serum creatinine   . Loose stools   . History of rectal cancer   . Labile blood pressure   . Vascular headache   . Benign essential HTN   . Global aphasia   . Blind   . 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 Stroke (cerebrum) ([place]) 03/20/2017  . Hydronephrosis of left kidney 06/09/2016  . 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 Hypokalemia 07/22/2015  . Diarrhea 07/22/2015  . Rectal cancer ([place]) 06/18/2015    Expected Discharge Date: Expected Discharge Date: 04/07/17  Team Members Present: Physician leading conference: Dr. Delice Lesch Social Worker Present: Ovidio Kin, LCSW Nurse Present: Frances Maywood, RN PT Present: Michaelene Song, PT OT Present: Clyda Greener, OT PPS Coordinator present : Daiva Nakayama, RN, CRRN     Current Status/Progress Goal Weekly Team Focus  Medical   1. 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   Improve mobility, cognition, incontinence  See above   Bowel/Bladder   continent of bowel/bladder         Swallow/Nutrition/ Hydration   goals met, d/c dysphagia goals          ADL's   Supervision for bathing sit to stand with supervision to min assist for dressing.  Min assist for transfers secondary to needing guidance for vision loss.  Still with receptive and  expressive deficits  supervsion overall with min assist for tub transfers  selfcare retraining, balance retraining, neuromuscular re-education, therapeutic activities, pt/family education   Mobility   min assist overall, min tactile/verbal/manual cues to guide pt through safe mobility including gait, stairs, car transfers, and uneven surfaces  min assist to supervision  discharge planning, family education, endurance, and functional balance   Communication   min-mod assist, significant improvement in spontaneous verbal expression  min assist   completion of education, grad day activities   Safety/Cognition/ Behavioral Observations            Pain   no c/o pain         Skin   no skin issues  monitor per protocol         *See Care Plan and progress notes for long and short-term goals.     Barriers to Discharge  Current Status/Progress Possible Resolutions Date Resolved   Physician    Medical stability;Other (comments)  Blind  See above  Therapies, bowel reg      Nursing                  PT  Inaccessible home environment;Behavior;Other (comments)  aphasia, blindness, 3 steps to enter home   min assist overall for physical assistance and cues for safe mobility, girlfriend has been trained on all mobility and assisting pt w/ appropriate cues  OT                  SLP                SW                Discharge Planning/Teaching Needs:  Family was here over the weekend and yesterday for family education, aware will need 24 hr care at discharge. PCS forms completed and sent in to agency family prefers      Team Discussion:  Progressing toward his goals of supervision-min assist level. Family training done over weekend and yesterday. Loop recorder on Friday prior to discharge home. Medically stable with BP and working on continence. Regular diet and able to communicate better with words and expressions.  Revisions to Treatment Plan:  DC 3/1    Continued Need for Acute  Rehabilitation Level of Care: The patient requires daily medical management by a physician with specialized training in physical medicine and rehabilitation for the following conditions: Daily direction of a multidisciplinary physical rehabilitation program to ensure safe treatment while eliciting the highest outcome that is of practical value to the patient.: Yes Daily medical management of patient stability for increased activity during participation in an intensive rehabilitation regime.: Yes Daily analysis of laboratory values and/or radiology reports with any subsequent need for medication adjustment of medical intervention for : Neurological problems;Other  Elease Hashimoto 04/05/2017, 2:58 PM

## 2017-04-05 NOTE — Progress Notes (Addendum)
[facility] PHYSICAL MEDICINE & REHABILITATION     PROGRESS NOTE    Subjective/Complaints: Pt seen lying in bed.  He states he slept well overnight.  He is more interactive today, but remains tangential and perseverative.  He states therapies are going well and asks when he is going to be discharge.   ROS: Limited due to cognition.  Objective: Vital Signs: Blood pressure 116/74, pulse (!) 50, temperature 98.2 F (36.8 C), temperature source Oral, resp. rate 18, weight 104 kg (229 lb 4.5 oz), SpO2 98 %. No results found. Recent Labs    04/03/17 0800  WBC 4.8  HGB 14.0  HCT 42.0  PLT 207   Recent Labs    04/03/17 0800  NA 138  K 3.9  CL 105  GLUCOSE 93  BUN 15  CREATININE 1.01  CALCIUM 8.9   CBG (last 3)  No results for input(s): GLUCAP in the last 72 hours.  Wt Readings from Last 3 Encounters:  04/05/17 104 kg (229 lb 4.5 oz)  03/24/17 100 kg (220 lb 7.4 oz)  03/20/17 99.8 kg (220 lb)    Physical Exam:  Constitutional: He appears well-developed and well-nourished. No acute distress.  HENT: Normocephalic and atraumatic.  Eyes: No discharge. Cardiovascular: RRR. No JVD    Respiratory: CTA Bilaterally.  Normal effort  Abdominal. +bowel sounds. Non distended Musc: No edema, no tenderness. Neurological. Alert Global aphasia, verbal apraxia.    Motor: Limited due to participation, but moving all extremities spontaneously (stable) Skin. Warm and dry. Intact. Psych: Unable to assess due to mentation (unchanged)  Assessment/Plan: 1. Functional deficits  secondary to right MCA infarct which require 3+ hours per day of interdisciplinary therapy in a comprehensive inpatient rehab setting. Physiatrist is providing close team supervision and 24 hour management of active medical problems listed below. Physiatrist and rehab team continue to assess barriers to discharge/monitor patient progress toward functional and medical goals.  Function:  Bathing Bathing position  Bathing activity did not occur: N/A Position: Shower  Bathing parts Body parts bathed by patient: Right arm, Chest, Abdomen, Front perineal area, Buttocks, Right upper leg, Left upper leg, Right lower leg, Left lower leg Body parts bathed by helper: Left arm  Bathing assist Assist Level: Touching or steadying assistance(Pt > 75%)      Upper Body Dressing/Undressing Upper body dressing   What is the patient wearing?: Pull over shirt/dress     Pull over shirt/dress - Perfomed by patient: Put head through opening, Pull shirt over trunk, Thread/unthread right sleeve, Thread/unthread left sleeve Pull over shirt/dress - Perfomed by helper: Thread/unthread left sleeve        Upper body assist Assist Level: Supervision or verbal cues      Lower Body Dressing/Undressing Lower body dressing   What is the patient wearing?: Shoes, Pants, Non-skid slipper socks Underwear - Performed by patient: Pull underwear up/down   Pants- Performed by patient: Thread/unthread right pants leg, Thread/unthread left pants leg, Pull pants up/down, Fasten/unfasten pants Pants- Performed by helper: Fasten/unfasten pants(tie pants)   Non-skid slipper socks- Performed by helper: Don/doff right sock, Don/doff left sock     Shoes - Performed by patient: Don/doff right shoe, Don/doff left shoe, Fasten right, Fasten left Shoes - Performed by helper: Fasten right, Fasten left       TED Hose - Performed by helper: Don/doff right TED hose, Don/doff left TED hose  Lower body assist Assist for lower body dressing: Supervision or verbal cues  Toileting Toileting   Toileting steps completed by patient: Adjust clothing prior to toileting, Performs perineal hygiene, Adjust clothing after toileting Toileting steps completed by helper: Performs perineal hygiene Toileting Assistive Devices: Grab bar or rail  Toileting assist Assist level: Supervision or verbal cues   Transfers Chair/bed transfer   Chair/bed  transfer method: Ambulatory Chair/bed transfer assist level: Touching or steadying assistance (Pt > 75%) Chair/bed transfer assistive device: Armrests     Locomotion Ambulation     Max distance: 200 ft Assist level: Touching or steadying assistance (Pt > 75%)   Wheelchair Wheelchair activity did not occur: Safety/medical concerns(Agitation)   Max wheelchair distance: 50 ft Assist Level: Touching or steadying assistance (Pt > 75%)(B LEs)  Cognition Comprehension Comprehension assist level: Understands basic 50 - 74% of the time/ requires cueing 25 - 49% of the time  Expression Expression assist level: Expresses basic 50 - 74% of the time/requires cueing 25 - 49% of the time. Needs to repeat parts of sentences.  Social Interaction Social Interaction assist level: Interacts appropriately 50 - 74% of the time - May be physically or verbally inappropriate.  Problem Solving Problem solving assist level: Solves basic 50 - 74% of the time/requires cueing 25 - 49% of the time  Memory Memory assist level: Recognizes or recalls 50 - 74% of the time/requires cueing 25 - 49% of the time   Medical Problem List and Plan:  1. 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 Status post loop recorder    Cont CIR 2. DVT Prophylaxis/Anticoagulation: Subcutaneous Lovenox. Monitor for any bleeding episodes.    Venous Doppler studies negative  3. Headaches/pain Management:   Fioricet as needed    -continue Tylenol every 6 hours as needed. 4. Mood: LCSW to follow for evaluation and support.  5. Neuropsych: This patient is not capable of making decisions on his own behalf.  6. Skin/Wound Care: routine pressure relief measures.  7. Fluids/Electrolytes/Nutrition: Monitor I/O.    BMP within acceptable range on 2/25   Encourage fluids 8. HTN: Lopressor 12.5 mg twice a day    Controlled on 2/27 9. Dyslipidemia: Lipitor  10. History of rectal cancer with resection and chemotherapy.   Follow-up  outpatient    Added Imodium for loose stools, encouraged use   Added probiotic as well to help with stool consistency   Fiber added on 2/20   Stable 11. Blindness.  Limited vision  12. Episodic bradycardia   Asymptomatic    Cont to monitor   LOS (Days) 12 A FACE TO FACE EVALUATION WAS PERFORMED  Ankit Lorie Phenix, MD 04/05/2017 2:41 PM

## 2017-04-05 NOTE — Progress Notes (Signed)
Occupational Therapy Session Note  Patient Details  Name: [name] [name] MRN: [id number] Date of Birth: [birth year]  Today's Date: 04/05/2017 OT Individual Time: 9628-3662 OT Individual Time Calculation (min): 54 min    Short Term Goals: Week 2:  OT Short Term Goal 1 (Week 2): Pt will continue working on established supervision to min assist level goals for discharge.    Skilled Therapeutic Interventions/Progress Updates:    Pt completed toileting, showering, and dressing.  He needed min assist for ambulation to the toilet secondary to visual deficits.  Once at the toilet he was able to manage all of his clothing and complete toilet hygiene with supervision sit to stand.  Had him transfer over to the shower for bathing next with max demonstrational cueing and min assist to understand directions to sit on the seat to shower.  Min assist with max instructional cueing for sequencing and for rinsing his body completely of soap as he was not aware of this.  He ambulated out to the EOB with min assist for dressing.  He completed all dressing sit to stand with supervision except for donning brief, which therapist assisted with.  Oral hygiene completed at the sink with setup before transferring to the bed to rest.  Bed alarm in place with call button and phone in reach.    Therapy Documentation Precautions:  Precautions Precautions: Fall, Other (comment) Precaution Comments: blind, global aphasia Restrictions Weight Bearing Restrictions: No  Pain: Pain Assessment Pain Assessment: No/denies pain Pain Score: 0-No pain Faces Pain Scale: Hurts a little bit Pain Type: Acute pain Pain Location: Knee Pain Orientation: Left Pain Descriptors / Indicators: Discomfort Pain Onset: With Activity Pain Intervention(s): Repositioned;Emotional support ADL: See Function Navigator for Current Functional Status.   Therapy/Group: Individual Therapy  Orvetta Danielski OTR/L 04/05/2017, 10:28 AM

## 2017-04-05 NOTE — Progress Notes (Signed)
Speech Language Pathology Daily Session Note  Patient Details  Name: [name] [name] MRN: [id number] Date of Birth: Apr 29, 1965  Today's Date: 04/05/2017 SLP Individual Time: 0803-0900 SLP Individual Time Calculation (min): 57 min  Short Term Goals: Week 2: SLP Short Term Goal 1 (Week 2): Pt will verbally express wants/needs with in 50% of opportunties with Mod A multimodal cues.  SLP Short Term Goal 2 (Week 2): Pt will answer basic yes/no questions with in 50% of opportunties with Mod A multimdoal cues. SLP Short Term Goal 3 (Week 2): Pt will follow 1 step commands with Min A multimodal cues.  SLP Short Term Goal 4 (Week 2): Pt will verbally express rote/automatic verbal sequences with Mod A multimodal cues.  SLP Short Term Goal 5 (Week 2): Pt will utilize call bell to request assistance with Mod A multimodal cues.   Skilled Therapeutic Interventions:  Pt was seen for skilled ST targeting communication goals.  Pt was upset upon therapist's arrival, stating that either his brother or uncle told him that he was going to an "old folks home."  SLP reassured pt that the plan had not changed and that he was still set to discharge home with his girlfriend on Friday.  SLP also reoriented pt to date to put time left on rehab into a functional context.  After multiple repetitions of information, pt was able to recall "Friday" as his discharge date with supervision.  Spontaneous verbal expression was significantly more coherent and pertinent to topic at hand in comparison to previous therapy sessions.  During a structured naming task, pt was able to name actions and objects from description with min assist verbal cues to correct tangential verbal output.  SLP also facilitated the session with a loosely structured recipe planning task to target expanding verbal output to the phrase/sentence level.  Pt needed mod assist verbal cues for topic maintenance during task due to increased tangentiality with decreased  task structure.  Pt was returned to room and left in recliner with call bell within reach at the end of today's therapy session.  Continue per current plan of care  Function:  Eating Eating                 Cognition Comprehension Comprehension assist level: Understands basic 50 - 74% of the time/ requires cueing 25 - 49% of the time  Expression   Expression assist level: Expresses basic 50 - 74% of the time/requires cueing 25 - 49% of the time. Needs to repeat parts of sentences.  Social Interaction Social Interaction assist level: Interacts appropriately 50 - 74% of the time - May be physically or verbally inappropriate.  Problem Solving Problem solving assist level: Solves basic 50 - 74% of the time/requires cueing 25 - 49% of the time  Memory Memory assist level: Recognizes or recalls 50 - 74% of the time/requires cueing 25 - 49% of the time    Pain Pain Assessment Pain Assessment: No/denies pain Pain Score: 0-No pain  Therapy/Group: Individual Therapy  Darrell Leonhardt, Selinda Orion 04/05/2017, 10:19 AM

## 2017-04-05 NOTE — Progress Notes (Signed)
Occupational Therapy Session Note  Patient Details  Name: [name] [name] MRN: [id number] Date of Birth: 1965/09/01  Today's Date: 04/05/2017 OT Individual Time: 1300-1330 make up time OT Individual Time Calculation (min): 30 min    Short Term Goals: Week 2:  OT Short Term Goal 1 (Week 2): Pt will continue working on established supervision to min assist level goals for discharge.    Skilled Therapeutic Interventions/Progress Updates:    Upon entering the room, pt supine in bed and reports, " called bathroom". Pt standing from bed and ambulating with min hand held assistance and verbal cues into bathroom. Pt was incontinent of BM in brief. Pt performed toilet transfer with supervision and continued to have BM. Pt standing with supervision and required min verbal cues for through hygiene. Pt ambulating in same manner to sink for hand hygiene with mod instructional cues . Pt returning to bed at end of session with bed alarm activated and call bell within reach.   Therapy Documentation Precautions:  Precautions Precautions: Fall, Other (comment) Precaution Comments: blind, global aphasia Restrictions Weight Bearing Restrictions: No General:   Vital Signs:  Pain: Pain Assessment Pain Assessment: No/denies pain Pain Score: 0-No pain ADL: ADL ADL Comments: Please see functional navigator for ADL status Vision   Perception    Praxis   Exercises:   Other Treatments:    See Function Navigator for Current Functional Status.   Therapy/Group: Individual Therapy  Gypsy Decant 04/05/2017, 2:50 PM

## 2017-04-05 NOTE — Progress Notes (Signed)
Physical Therapy Session Note  Patient Details  Name: [name] [name] MRN: [id number] Date of Birth: 1965/04/16  Today's Date: 04/05/2017 PT Individual Time: 1941-7408 PT Individual Time Calculation (min): 43 min   Short Term Goals: Week 2:  PT Short Term Goal 1 (Week 2): =LTGs due to ELOS  Skilled Therapeutic Interventions/Progress Updates:     Pt supine in bed upon PT arrival, agreeable to therapy tx and denies pain. Pt with increased time to transfer to sitting as he was upset, stating that either his brother or uncle told him that he was going to an "old folks home."  PT continued to reassure pt that the plan had not changed and that he was still set to discharge home with his girlfriend on Friday, pt continued to perseverate on the subject. Therapist reoriented pt to current date and days left in rehab. Pt transferred from supine>sitting with supervision and verbal cues. Pt performed sit<>stand and ambulated out of his room with min assist. Pt ambulated throughout unit from room<>dayroomwhile following directions/commands.Min-mod verbal/tactile/manual cues for direction-changing, reaching for environmental cues including rail, doorways, and for obstacle avoidance. Pt left supine in bed at end of session, needs in reach.    Therapy Documentation Precautions:  Precautions Precautions: Fall, Other (comment) Precaution Comments: blind, global aphasia Restrictions Weight Bearing Restrictions: No   See Function Navigator for Current Functional Status.   Therapy/Group: Individual Therapy  Netta Corrigan, PT, DPT 04/05/2017, 7:52 AM

## 2017-04-06 ENCOUNTER — Inpatient Hospital Stay (HOSPITAL_COMMUNITY): Payer: Medicaid Other | Admitting: Occupational Therapy

## 2017-04-06 ENCOUNTER — Inpatient Hospital Stay (HOSPITAL_COMMUNITY): Payer: Medicaid Other | Admitting: Physical Therapy

## 2017-04-06 ENCOUNTER — Inpatient Hospital Stay (HOSPITAL_COMMUNITY): Payer: Medicaid Other | Admitting: Speech Pathology

## 2017-04-06 NOTE — Discharge Summary (Signed)
Discharge summary job [id number]

## 2017-04-06 NOTE — Progress Notes (Signed)
Occupational Therapy Session Note  Patient Details  Name: [name] [name] MRN: [id number] Date of Birth: 1965/04/06  Today's Date: 04/06/2017 OT Individual Time: 7793-9030 OT Individual Time Calculation (min): 25 min   Short Term Goals: Week 2:  OT Short Term Goal 1 (Week 2): Pt will continue working on established supervision to min assist level goals for discharge.    Skilled Therapeutic Interventions/Progress Updates:    Pt greeted asleep in bed but easy to awake. Asked pt if he needed to go to the bathroom and he stated yes. Pt stood at EOB and began to adjust clothing to urinate prior to ambulating to the bathroom. Pt needed multimodal cues to stop and ambulate to the bathroom to sit on commode. Min A with multimodal cues to locate bathroom and sit safely onto commode. Pt completed 3/3 toileting steps with OT providing set-up and min guard A for balance. Pt incontinent of BM, but voided bladder into commode. Pt's dreadlocks also soiled from BM and required OT assist to clean. Pt given wash cloth to clean hands, then returned to bed at end of session. Pt left in sidelying with bed alarm on and needs met.   Therapy Documentation Precautions:  Precautions Precautions: Fall, Other (comment) Precaution Comments: blind, global aphasia Restrictions Weight Bearing Restrictions: No Pain: Pain Assessment Pain Assessment: 0-10 Pain Score: 0-No pain  See Function Navigator for Current Functional Status.   Therapy/Group: Individual Therapy  Valma Cava 04/06/2017, 3:31 PM

## 2017-04-06 NOTE — Plan of Care (Signed)
  Progressing Consults RH STROKE PATIENT EDUCATION Description See Patient Education module for education specifics  04/06/2017 0330 - Progressing by Evelena Asa, RN RH BOWEL ELIMINATION RH STG MANAGE BOWEL WITH ASSISTANCE Description STG Manage Bowel with min Assistance.  04/06/2017 0330 - Progressing by Evelena Asa, RN RH SKIN INTEGRITY RH STG SKIN FREE OF INFECTION/BREAKDOWN Description No skin breakdown while in rehab with min assist  04/06/2017 0330 - Progressing by Evelena Asa, RN RH SAFETY RH STG ADHERE TO SAFETY PRECAUTIONS W/ASSISTANCE/DEVICE Description STG Adhere to Safety Precautions With min Assistance/Device.  04/06/2017 0330 - Progressing by Evelena Asa, RN

## 2017-04-06 NOTE — Progress Notes (Signed)
Occupational Therapy Discharge Summary  Patient Details  Name: [name] [name] MRN: [id number] Date of Birth: 12-Oct-1965  Today's Date: 04/06/2017 OT Individual Time: 1601-0932 OT Individual Time Calculation (min): 53 min    Session Note:  Pt completed bathing, dressing, and grooming during session.  He was able to perform functional mobility to and from the shower with min assistance for guidance.  He completed shower, mostly in standing, except for washing his lower legs and feet.  He needed min instructional cueing to wash his LEs and arms secondary to perseverating on washing his under arms, peri area, and buttocks.  Supervision for all dressing sit to stand at the EOB with mod instructional cueing to sequence.  He finished session with functional mobility over to the sink to brush his teeth.  Min assist for mobility with supervision for completion of grooming task.   Pt left in bed with call button and phone in reach at end of session.  Telesitter also in place.    Patient has met 7 of 10 long term goals due to improved balance, ability to compensate for deficits, functional use of  LEFT upper extremity, improved attention, improved awareness and improved coordination.  Patient to discharge at [facility] Assist level.  Patient's care partner is independent to provide the necessary physical and cognitive assistance at discharge.    Reasons goals not met: Pt needs min assist for transfers as well as dynamic standing balance secondary to visual deficits and balance deficits.    Recommendation:  Patient will benefit from ongoing skilled OT services in home health setting to continue to advance functional skills in the area of BADL and Reduce care partner burden.  Feel pt will benefit from continued [place] for assessment of function in familiar environment and to continue working on ADL independence, sequencing, left attention, and problem solving.    Equipment: tub bench, and 3:1  Reasons for  discharge: treatment goals met and discharge from hospital  Patient/family agrees with progress made and goals achieved: Yes  OT Discharge Precautions/Restrictions  Precautions Precautions: Fall;Other (comment) Precaution Comments: blind, global aphasia Restrictions Weight Bearing Restrictions: No  Pain Pain Assessment Pain Assessment: 0-10 Pain Score: 5  Pain Type: Acute pain Pain Location: Head Pain Descriptors / Indicators: Headache Pain Frequency: Occasional Pain Onset: Unable to tell Patients Stated Pain Goal: 4 Pain Intervention(s): Medication (See eMAR) ADL ADL ADL Comments: Please see functional navigator for ADL status Vision Baseline Vision/History: Legally blind Patient Visual Report: Other (comment);No change from baseline Perception  Perception: Impaired Inattention/Neglect: Does not attend to left side of body Praxis Praxis: Impaired Praxis Impairment Details: Perseveration Praxis-Other Comments: Pt tends to wash the same upper body parts over and over and times, requiring cueing to progress.  Cognition Overall Cognitive Status: Impaired/Different from baseline Arousal/Alertness: Awake/alert Orientation Level: Oriented to situation;Oriented to person;Disoriented to place Attention: Sustained Focused Attention: Appears intact Sustained Attention: Impaired Sustained Attention Impairment: Functional basic Memory: Impaired Memory Impairment: Decreased recall of new information;Decreased short term memory Awareness: Impaired Awareness Impairment: Anticipatory impairment Problem Solving: Impaired Behaviors: Perseveration;Impulsive Safety/Judgment: Impaired Comments: Mildly impulsive, perseverates on an activity or specific words therapist or other person has stated- Sensation Sensation Light Touch: (Not formally assessed secondary to cognition, and aphasia but pt able to grasp items spontaneously when placed in his left hand) Stereognosis: Appears  Intact Hot/Cold: Appears Intact Proprioception: Impaired Detail Proprioception Impaired Details: Impaired LLE Additional Comments: Sensation difficult to formally test secondary to expressive and receptive difficulties.  Pt  is able to use the LUE spontaneously with selfcare tasks at a non-dominan level. Coordination Gross Motor Movements are Fluid and Coordinated: No Fine Motor Movements are Fluid and Coordinated: Yes Coordination and Movement Description: Pt with slight FM coordination impairment when attempting to tie shoes and pants.  Sometimes requiring multiple attempts.  Otherwise no other difficulties noted.  Motor  Motor Motor: Hemiplegia Motor - Skilled Clinical Observations: Mild L hemi Motor - Discharge Observations: Mild L hemi paresis Mobility  Bed Mobility Bed Mobility: Rolling Right;Rolling Left;Supine to Sit;Sit to Supine Rolling Right: 5: Supervision Rolling Left: 5: Supervision Supine to Sit: 5: Supervision Sit to Supine: 5: Supervision Transfers Transfers: Sit to Stand;Stand to Sit Sit to Stand: 5: Supervision Sit to Stand Details: Tactile cues for initiation;Verbal cues for precautions/safety;Verbal cues for technique Stand to Sit: 5: Supervision Stand to Sit Details (indicate cue type and reason): Tactile cues for initiation;Verbal cues for technique;Verbal cues for precautions/safety  Trunk/Postural Assessment  Cervical Assessment Cervical Assessment: Within Functional Limits Thoracic Assessment Thoracic Assessment: Within Functional Limits Lumbar Assessment Lumbar Assessment: Exceptions to WFL(slight posterior pelvic tilt at rest) Postural Control Postural Control: Deficits on evaluation(delayed)  Balance Balance Balance Assessed: Yes Static Sitting Balance Static Sitting - Balance Support: No upper extremity supported;Feet supported Static Sitting - Level of Assistance: 7: Independent Dynamic Sitting Balance Dynamic Sitting - Balance Support: During  functional activity Dynamic Sitting - Level of Assistance: 6: Modified independent (Device/Increase time) Static Standing Balance Static Standing - Balance Support: No upper extremity supported;During functional activity Static Standing - Level of Assistance: 5: Stand by assistance Dynamic Standing Balance Dynamic Standing - Balance Support: No upper extremity supported;During functional activity Dynamic Standing - Level of Assistance: 4: Min assist Extremity/Trunk Assessment RUE Assessment RUE Assessment: Within Functional Limits LUE Assessment LUE Assessment: Exceptions to [facility] WFLS during gross assessment in functional tasks.  Slight difficulty noted with tying workout pants and shoes but is able to use the LUE at a non-dominan level. )   See Function Navigator for Current Functional Status.  Kollen Armenti OTR/L 04/06/2017, 12:40 PM

## 2017-04-06 NOTE — Discharge Instructions (Signed)
Inpatient Rehab Discharge Instructions  ORRY SIGL Discharge date and time: No discharge date for patient encounter.   Activities/Precautions/ Functional Status: Activity: activity as tolerated Diet: regular diet Wound Care: keep wound clean and dry Functional status:  ___ No restrictions     ___ Walk up steps independently ___ 24/7 supervision/assistance   ___ Walk up steps with assistance ___ Intermittent supervision/assistance  ___ Bathe/dress independently ___ Walk with walker     _x__ Bathe/dress with assistance ___ Walk Independently    ___ Shower independently ___ Walk with assistance    ___ Shower with assistance ___ No alcohol     ___ Return to work/school ________  Special Instructions:    COMMUNITY REFERRALS UPON DISCHARGE:    Home Health:   PT, OT, [name]   Date of last service:04/07/2017  Medical Equipment/Items Ordered:WHEELCHAIR, [place]   [phone]  Other:[facility]- [phone]  GENERAL COMMUNITY RESOURCES FOR PATIENT/FAMILY: Support Groups:[facility] SUPPORT GROUP  EVERY SECOND Thursday @ 3:00-4:00 PM ON THE REHAB UNIT QUESTIONS CONTACT [facility] [phone]  My questions have been answered and I understand these instructions. I will adhere to these goals and the provided educational materials after my discharge from the hospital.  Patient/Caregiver Signature _______________________________ Date __________  Clinician Signature _______________________________________ Date __________  Please bring this form and your medication list with you to all your follow-up doctor's appointments.

## 2017-04-06 NOTE — Progress Notes (Signed)
Physical Therapy Discharge Summary  Patient Details  Name: [name] [name] MRN: [id number] Date of Birth: [birth year]  Today's Date: 04/06/2017 PT Individual Time: 2536-6440 PT Individual Time Calculation (min): 70 min   Pt in supine and agreeable to therapy, c/o headache - RN made aware and present to provide medication. Performed functional mobility as outlined in d/c summary below including ambulating in multiple [phone]' bouts, negotiating 10 steps w/ R rail assist and ramp/uneven surfaces, and performing car transfer, bed<>chair transfers, bed mobility, and sit<>stands. Pt requires min guard to min assist for all OOB functional mobility 2/2 blindness and L inattention. Most assistance provided via unilateral HHA for directions, way-finding, and obstacle avoidance. Verbal cues for obstacle avoidance and UE/LE placement. Additionally practiced ambulating in community environments at other parts of hospital to work on endurance and feeling comfortable in unfamiliar environments. Pt demonstrated no signs of frustration and agitation, even w/ fatigue, and took seated rest breaks every 10-15 min to help w/ fatigue. He did require additional verbal/tactile cues in community environment 2/2 unfamiliarity. Ended session in supine, call bell within reach and all needs met.   Patient has met 7 of 8 long term goals due to improved activity tolerance, improved balance, ability to compensate for deficits, improved attention, improved awareness and improved coordination.  Patient to discharge at an ambulatory level [place].   Patient's care partner is independent to provide the necessary physical assistance at discharge.  Reasons goals not met: Pt requires tactile assist to perform safe bed<>chair transfer.   Recommendation:  Patient will benefit from ongoing skilled PT services in home health setting to continue to advance safe functional mobility, address ongoing impairments in endurance, L inattention,  balance, functional mobility, motor planning, and coordination, and minimize fall risk.  Equipment: w/c  Reasons for discharge: treatment goals met and discharge from hospital  Patient/family agrees with progress made and goals achieved: Yes  PT Discharge Precautions/Restrictions Precautions Precautions: Fall;Other (comment) Precaution Comments: blind, global aphasia Restrictions Weight Bearing Restrictions: No Vital Signs Therapy Vitals Pulse Rate: 62 BP: 130/74 Pain Pain Assessment Pain Assessment: 0-10 Pain Score: 5  Pain Type: Acute pain Pain Location: Head Pain Descriptors / Indicators: Headache Pain Frequency: Occasional Pain Onset: Unable to tell Patients Stated Pain Goal: 4 Pain Intervention(s): Medication (See eMAR) Vision/Perception  Perception Perception: Impaired Inattention/Neglect: Does not attend to left side of body(Decreased attention to L side of body and environment, however improved since eval) Praxis Praxis: Impaired Praxis Impairment Details: Initiation;Perseveration  Cognition Overall Cognitive Status: Impaired/Different from baseline Arousal/Alertness: Awake/alert Orientation Level: Oriented to situation;Oriented to person Attention: Sustained Focused Attention: Impaired Sustained Attention: Impaired Memory: Impaired Awareness: Impaired Problem Solving: Impaired Behaviors: Perseveration;Impulsive Safety/Judgment: Impaired Comments: Mildly impulsive, perseverates on an activity or specific words therapist or other person has stated- Sensation Sensation Light Touch: Impaired by gross assessment(not formally tested 2/2 aphasia, per chart review, 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) Proprioception: Impaired Detail Proprioception Impaired Details: Impaired LLE Coordination Gross Motor Movements are Fluid and Coordinated: No(impaired BLE coordination and limb dissociation) Motor  Motor Motor: Hemiplegia Motor - Skilled Clinical Observations:  Mild L hemi  Mobility Bed Mobility Bed Mobility: Rolling Right;Rolling Left;Supine to Sit;Sit to Supine Rolling Right: 5: Supervision Rolling Left: 5: Supervision Supine to Sit: 5: Supervision Sit to Supine: 5: Supervision Transfers Transfers: Yes Sit to Stand: 5: Supervision Sit to Stand Details: Tactile cues for initiation;Verbal cues for precautions/safety;Verbal cues for technique Stand to Sit: 5: Supervision Stand to Sit Details (indicate cue type  and reason): Tactile cues for initiation;Verbal cues for technique;Verbal cues for precautions/safety Stand Pivot Transfers: 4: Min guard Locomotion  Ambulation Ambulation: Yes Ambulation/Gait Assistance: 4: Min guard Ambulation Distance (Feet): 1000 Feet Assistive device: 1 person hand held assist Gait Gait: Yes Gait Pattern: Impaired Gait Pattern: Wide base of support Gait velocity: decreased Stairs / Additional Locomotion Stairs: Yes Stairs Assistance: 4: Min assist Stairs Assistance Details: Tactile cues for initiation;Verbal cues for technique;Verbal cues for precautions/safety;Tactile cues for sequencing;Manual facilitation for weight shifting Stairs Assistance Details (indicate cue type and reason): Assist 2/2 blindness, pt requires min guard at the least and min assist at most for manual weight-shifting and cues for safety Stair Management Technique: One rail Right Number of Stairs: 10 Height of Stairs: 6 Ramp: 4: Min assist Curb: 4: Min Administrator Mobility: No  Trunk/Postural Assessment  Cervical Assessment Cervical Assessment: Within Functional Limits Thoracic Assessment Thoracic Assessment: Within Functional Limits Lumbar Assessment Lumbar Assessment: Exceptions to WFL(posterior pelvic tilt) Postural Control Postural Control: Deficits on evaluation(delayed)  Balance Balance Balance Assessed: Yes Static Sitting Balance Static Sitting - Balance Support: No upper extremity  supported;Feet supported Static Sitting - Level of Assistance: 5: Stand by assistance Dynamic Sitting Balance Dynamic Sitting - Balance Support: No upper extremity supported;Feet supported Dynamic Sitting - Level of Assistance: 5: Stand by assistance Static Standing Balance Static Standing - Balance Support: No upper extremity supported;During functional activity Static Standing - Level of Assistance: 5: Stand by assistance Dynamic Standing Balance Dynamic Standing - Balance Support: No upper extremity supported;During functional activity Dynamic Standing - Level of Assistance: 5: Stand by assistance Extremity Assessment  RLE Assessment RLE Assessment: Within Functional Limits LLE Assessment LLE Assessment: Exceptions to WFL(4/5 globally, difficult to assess 2/2 cognition)   See Function Navigator for Current Functional Status.  Banita Lehn K Arnette 04/06/2017, 12:00 PM

## 2017-04-06 NOTE — Progress Notes (Signed)
Speech Language Pathology Discharge Summary  Patient Details  Name: [name] [name] MRN: [id number] Date of Birth: 04-28-1965  Today's Date: 04/06/2017 SLP Individual Time: 2641-5830 SLP Individual Time Calculation (min): 27 min   Skilled Therapeutic Interventions:  Pt was seen for skilled ST targeting cognitive-linguistic goals.  Pt needed mod assist to convey to the therapist that he needed to use the bathroom.   SLP facilitated the session with min assist verbal and tactile cues for following 1 and 2 step commands in a functional context for ambulating to the bathroom and performing hygiene following a continent/incontinent bowel movement.  Pt requested to wash his hands once finished toileting with supervision cues for use of gestures when he was unable to produce the appropriate words.  In less structured conversations with therapist, pt needed mod-max assist multimodal cues for topic maintenance due to tangentiality with poor awareness.   Pt was left in bed with bed alarm set and call bell within reach.  Pt is ready for discharge tomorrow.      Patient has met 2 of 3 long term goals.  Patient to discharge at overall Min;Mod level.  Reasons goals not met:     Clinical Impression/Discharge Summary:   Pt has made functional gains while inpatient and is discharging having met 2 out of 3 short term goals.  Pt is currently mod assist for communication due to global aphasia and is consuming a regular diet and thin liquids with set up assist to accommodate for visual deficits.  Pt and family education is complete at this time.  Pt has demonstrated improved topic maintenance, receptive ability for following 1 and 2 step commands, and word finding during structured tasks.  Pt is discharging home with 24/7 supervision from his girlfriend and recommendations for intensive ST follow up at next level of care for cognitive-communication function.    Care Partner:  Caregiver Able to Provide Assistance: Yes   Type of Caregiver Assistance: Physical;Cognitive  Recommendation:  Home Health SLP;Outpatient SLP;24 hour supervision/assistance  Rationale for SLP Follow Up: Maximize functional communication;Maximize cognitive function and independence;Reduce caregiver burden   Equipment: none recommended by SLP    Reasons for discharge: Discharged from hospital   Patient/Family Agrees with Progress Made and Goals Achieved: Yes   Function:  Cognition Comprehension Comprehension assist level: Understands basic 75 - 89% of the time/ requires cueing 10 - 24% of the time  Expression   Expression assist level: Expresses basic 50 - 74% of the time/requires cueing 25 - 49% of the time. Needs to repeat parts of sentences.  Social Interaction Social Interaction assist level: Interacts appropriately 75 - 89% of the time - Needs redirection for appropriate language or to initiate interaction.  Problem Solving Problem solving assist level: Solves basic 50 - 74% of the time/requires cueing 25 - 49% of the time  Memory Memory assist level: Recognizes or recalls 25 - 49% of the time/requires cueing 50 - 75% of the time   Emilio Math 04/06/2017, 4:18 PM

## 2017-04-06 NOTE — Progress Notes (Signed)
[facility] PHYSICAL MEDICINE & REHABILITATION     PROGRESS NOTE    Subjective/Complaints: Patient seen sitting up in bed this morning. No reported issues overnight. He has increased spontaneous speech, but it is nonsensical.  ROS: Ltd. due to cognition.  Objective: Vital Signs: Blood pressure 127/76, pulse (!) 54, temperature 98.1 F (36.7 C), temperature source Oral, resp. rate 20, weight 104 kg (229 lb 4.5 oz), SpO2 100 %. No results found. No results for input(s): WBC, HGB, HCT, PLT in the last 72 hours. No results for input(s): NA, K, CL, GLUCOSE, BUN, CREATININE, CALCIUM in the last 72 hours.  Invalid input(s): CO CBG (last 3)  No results for input(s): GLUCAP in the last 72 hours.  Wt Readings from Last 3 Encounters:  04/05/17 104 kg (229 lb 4.5 oz)  03/24/17 100 kg (220 lb 7.4 oz)  03/20/17 99.8 kg (220 lb)    Physical Exam:  Constitutional: He appears well-developed and well-nourished. No acute distress.  HENT: Normocephalic and atraumatic.  Eyes: No discharge. Cardiovascular: RRR. No JVD    Respiratory: CTA Bilaterally.  Normal effort  Abdominal. +bowel sounds. Non distended Musc: No edema, no tenderness. Neurological. Alert Global aphasia, some improvement    Motor: Limited due to participation, but moving all extremities spontaneously (unchanged) Skin. Warm and dry. Intact. Psych: Unable to assess due to mentation (unchanged)  Assessment/Plan: 1. Functional deficits  secondary to right MCA infarct which require 3+ hours per day of interdisciplinary therapy in a comprehensive inpatient rehab setting. Physiatrist is providing close team supervision and 24 hour management of active medical problems listed below. Physiatrist and rehab team continue to assess barriers to discharge/monitor patient progress toward functional and medical goals.  Function:  Bathing Bathing position Bathing activity did not occur: N/A Position: Shower  Bathing parts Body parts  bathed by patient: Right arm, Chest, Abdomen, Front perineal area, Buttocks, Right upper leg, Left upper leg, Right lower leg, Left lower leg Body parts bathed by helper: Left arm  Bathing assist Assist Level: Touching or steadying assistance(Pt > 75%)      Upper Body Dressing/Undressing Upper body dressing   What is the patient wearing?: Pull over shirt/dress     Pull over shirt/dress - Perfomed by patient: Put head through opening, Pull shirt over trunk, Thread/unthread right sleeve, Thread/unthread left sleeve Pull over shirt/dress - Perfomed by helper: Thread/unthread left sleeve        Upper body assist Assist Level: Supervision or verbal cues      Lower Body Dressing/Undressing Lower body dressing   What is the patient wearing?: Shoes, Pants, Non-skid slipper socks Underwear - Performed by patient: Pull underwear up/down   Pants- Performed by patient: Thread/unthread right pants leg, Thread/unthread left pants leg, Pull pants up/down, Fasten/unfasten pants Pants- Performed by helper: Fasten/unfasten pants(tie pants)   Non-skid slipper socks- Performed by helper: Don/doff right sock, Don/doff left sock     Shoes - Performed by patient: Don/doff right shoe, Don/doff left shoe, Fasten right, Fasten left Shoes - Performed by helper: Fasten right, Fasten left       TED Hose - Performed by helper: Don/doff right TED hose, Don/doff left TED hose  Lower body assist Assist for lower body dressing: Supervision or verbal cues      Toileting Toileting   Toileting steps completed by patient: Adjust clothing prior to toileting, Performs perineal hygiene, Adjust clothing after toileting Toileting steps completed by helper: Performs perineal hygiene Toileting Assistive Devices: Grab bar or rail  Toileting assist Assist level: Touching or steadying assistance (Pt.75%)   Transfers Chair/bed transfer   Chair/bed transfer method: Ambulatory Chair/bed transfer assist level: Touching  or steadying assistance (Pt > 75%) Chair/bed transfer assistive device: Armrests     Locomotion Ambulation     Max distance: 10' Assist level: Touching or steadying assistance (Pt > 75%)   Wheelchair Wheelchair activity did not occur: Safety/medical concerns(Agitation)   Max wheelchair distance: 50 ft Assist Level: Touching or steadying assistance (Pt > 75%)(B LEs)  Cognition Comprehension Comprehension assist level: Understands basic 50 - 74% of the time/ requires cueing 25 - 49% of the time  Expression Expression assist level: Expresses basic 50 - 74% of the time/requires cueing 25 - 49% of the time. Needs to repeat parts of sentences.  Social Interaction Social Interaction assist level: Interacts appropriately 50 - 74% of the time - May be physically or verbally inappropriate.  Problem Solving Problem solving assist level: Solves basic 50 - 74% of the time/requires cueing 25 - 49% of the time  Memory Memory assist level: Recognizes or recalls 50 - 74% of the time/requires cueing 25 - 49% of the time   Medical Problem List and Plan:  1. 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 Status post loop recorder    Cont CIR, plan for DC tomorrow   Making some, but slow cognitive gains 2. DVT Prophylaxis/Anticoagulation: Subcutaneous Lovenox. Monitor for any bleeding episodes.    Venous Doppler studies negative  3. Headaches/pain Management:   Fioricet as needed    -continue Tylenol every 6 hours as needed. 4. Mood: LCSW to follow for evaluation and support.  5. Neuropsych: This patient is not capable of making decisions on his own behalf.  6. Skin/Wound Care: routine pressure relief measures.  7. Fluids/Electrolytes/Nutrition: Monitor I/O.    BMP within acceptable range on 2/25   Encourage fluids 8. HTN: Lopressor 12.5 mg twice a day    Controlled on 2/ 28 9. Dyslipidemia: Lipitor  10. History of rectal cancer with resection and chemotherapy.   Follow-up outpatient     Added Imodium for loose stools, encouraged use   Added probiotic as well to help with stool consistency   Fiber added on 2/20   Stable 11. Blindness.  Limited vision  12. Episodic bradycardia   Persistent   Asymptomatic    Cont to monitor   LOS (Days) 13 A FACE TO FACE EVALUATION WAS PERFORMED  Ankit Lorie Phenix, MD 04/06/2017 8:24 AM

## 2017-04-07 ENCOUNTER — Ambulatory Visit (HOSPITAL_COMMUNITY): Admission: RE | Admit: 2017-04-07 | Payer: Medicaid Other | Source: Ambulatory Visit | Admitting: Internal Medicine

## 2017-04-07 LAB — CREATININE, SERUM
Creatinine, Ser: 1.07 mg/dL (ref 0.61–1.24)
GFR calc Af Amer: >60 mL/min (ref >60)
GFR calc non Af Amer: >60 mL/min (ref >60)

## 2017-04-07 SURGERY — LOOP RECORDER INSERTION

## 2017-04-07 MED ORDER — BUTALBITAL-APAP-CAFFEINE 50-325-40 MG PO TABS: 1.0000 | ORAL_TABLET | Freq: Three times a day (TID) | ORAL | 0 refills | Status: DC | PRN

## 2017-04-07 MED ORDER — CALCIUM POLYCARBOPHIL 625 MG PO TABS
1250.0000 mg | ORAL_TABLET | Freq: Every day | ORAL | 0 refills | Status: DC
Start: 2017-04-07 — End: 2023-02-17

## 2017-04-07 MED ORDER — METHOCARBAMOL 500 MG PO TABS: 500.0000 mg | ORAL_TABLET | Freq: Three times a day (TID) | ORAL | 0 refills | Status: DC | PRN

## 2017-04-07 MED ORDER — METOPROLOL TARTRATE 25 MG PO TABS: 12.5000 mg | ORAL_TABLET | Freq: Every day | ORAL | 1 refills | Status: DC

## 2017-04-07 MED ORDER — ATORVASTATIN CALCIUM 80 MG PO TABS: 80.0000 mg | ORAL_TABLET | Freq: Every day | ORAL | 1 refills | Status: DC

## 2017-04-07 MED ORDER — SACCHAROMYCES BOULARDII 250 MG PO CAPS: 250.0000 mg | ORAL_CAPSULE | Freq: Two times a day (BID) | ORAL | 0 refills | Status: DC

## 2017-04-07 MED ORDER — ASPIRIN 325 MG PO TBEC: 325.0000 mg | DELAYED_RELEASE_TABLET | Freq: Every day | ORAL | 0 refills | Status: DC

## 2017-04-07 MED ORDER — ALPRAZOLAM 0.25 MG PO TABS: 0.2500 mg | ORAL_TABLET | Freq: Three times a day (TID) | ORAL | 0 refills | Status: DC | PRN

## 2017-04-07 NOTE — Progress Notes (Signed)
PHYSICAL MEDICINE & REHABILITATION     PROGRESS NOTE    Subjective/Complaints: Pt seen lying in bed this AM.  No reported issues overnight.  Per nursing, scheduled to go for loop placement today and then d/c home.  Continues to have nonsensical speech.   ROS: Limited due to cognition.  Objective: Vital Signs: Blood pressure 118/70, pulse (!) 59, temperature 98 F (36.7 C), temperature source Oral, resp. rate 12, weight 104 kg (229 lb 4.5 oz), SpO2 98 %. No results found. No results for input(s): WBC, HGB, HCT, PLT in the last 72 hours. Recent Labs    04/07/17 0546  CREATININE 1.07   CBG (last 3)  No results for input(s): GLUCAP in the last 72 hours.  Wt Readings from Last 3 Encounters:  04/05/17 104 kg (229 lb 4.5 oz)  03/24/17 100 kg (220 lb 7.4 oz)  03/20/17 99.8 kg (220 lb)    Physical Exam:  Constitutional: He appears well-developed and well-nourished. No acute distress.  HENT: Normocephalic and atraumatic.  Eyes: No discharge. Cardiovascular: RRR. No JVD    Respiratory: CTA Bilaterally.  Normal effort  Abdominal. +bowel sounds. Non distended Musc: No edema, no tenderness. Neurological. Alert Global aphasia, some improvement    Motor: Limited due to participation, but moving all extremities spontaneously (stable) Skin. Warm and dry. Intact. Psych: Unable to assess due to mentation (unchanged)  Assessment/Plan: 1. Functional deficits  secondary to right MCA infarct which require 3+ hours per day of interdisciplinary therapy in a comprehensive inpatient rehab setting. Physiatrist is providing close team supervision and 24 hour management of active medical problems listed below. Physiatrist and rehab team continue to assess barriers to discharge/monitor patient progress toward functional and medical goals.  Function:  Bathing Bathing position Bathing activity did not occur: N/A Position: Shower  Bathing parts Body parts bathed by patient: Right arm,  Chest, Abdomen, Front perineal area, Buttocks, Right upper leg, Left upper leg, Right lower leg, Left lower leg Body parts bathed by helper: Back  Bathing assist Assist Level: Touching or steadying assistance(Pt > 75%)      Upper Body Dressing/Undressing Upper body dressing   What is the patient wearing?: Pull over shirt/dress     Pull over shirt/dress - Perfomed by patient: Put head through opening, Pull shirt over trunk, Thread/unthread right sleeve, Thread/unthread left sleeve Pull over shirt/dress - Perfomed by helper: Thread/unthread left sleeve        Upper body assist Assist Level: Supervision or verbal cues      Lower Body Dressing/Undressing Lower body dressing   What is the patient wearing?: Shoes, Pants, Non-skid slipper socks, Socks Underwear - Performed by patient: Pull underwear up/down   Pants- Performed by patient: Thread/unthread right pants leg, Thread/unthread left pants leg, Pull pants up/down, Fasten/unfasten pants Pants- Performed by helper: Fasten/unfasten pants(tie pants) Non-skid slipper socks- Performed by patient: Don/doff left sock, Don/doff right sock Non-skid slipper socks- Performed by helper: Don/doff right sock, Don/doff left sock Socks - Performed by patient: Don/doff right sock, Don/doff left sock   Shoes - Performed by patient: Don/doff right shoe, Don/doff left shoe, Fasten right, Fasten left Shoes - Performed by helper: Fasten right, Fasten left       TED Hose - Performed by helper: Don/doff right TED hose, Don/doff left TED hose  Lower body assist Assist for lower body dressing: Supervision or verbal cues      Toileting Toileting   Toileting steps completed by patient: Adjust clothing prior to toileting,  Performs perineal hygiene, Adjust clothing after toileting Toileting steps completed by helper: Performs perineal hygiene Toileting Assistive Devices: Grab bar or rail  Toileting assist Assist level: Touching or steadying assistance  (Pt.75%)   Transfers Chair/bed transfer   Chair/bed transfer method: Ambulatory, Stand pivot Chair/bed transfer assist level: Touching or steadying assistance (Pt > 75%) Chair/bed transfer assistive device: Armrests     Locomotion Ambulation     Max distance: 1000' Assist level: Touching or steadying assistance (Pt > 75%)   Wheelchair Wheelchair activity did not occur: Safety/medical concerns(Agitation)   Max wheelchair distance: 50 ft Assist Level: Touching or steadying assistance (Pt > 75%)(B LEs)  Cognition Comprehension Comprehension assist level: Understands basic 50 - 74% of the time/ requires cueing 25 - 49% of the time  Expression Expression assist level: Expresses basic 25 - 49% of the time/requires cueing 50 - 75% of the time. Uses single words/gestures.  Social Interaction Social Interaction assist level: Interacts appropriately 75 - 89% of the time - Needs redirection for appropriate language or to initiate interaction.  Problem Solving Problem solving assist level: Solves basic 50 - 74% of the time/requires cueing 25 - 49% of the time  Memory Memory assist level: Recognizes or recalls 25 - 49% of the time/requires cueing 50 - 75% of the time   Medical Problem List and Plan:  1. 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    D/c today    Will see patient for transitional care management in 1-2 weeks   Making some, but slow cognitive gains 2. DVT Prophylaxis/Anticoagulation: Subcutaneous Lovenox. Monitor for any bleeding episodes.    Venous Doppler studies negative  3. Headaches/pain Management:   Fioricet as needed    -continue Tylenol every 6 hours as needed. 4. Mood: LCSW to follow for evaluation and support.  5. Neuropsych: This patient is not capable of making decisions on his own behalf.  6. Skin/Wound Care: routine pressure relief measures.  7. Fluids/Electrolytes/Nutrition: Monitor I/O.    BMP within acceptable range on 2/25, Cr WNL on 3/1    Encourage fluids 8. HTN: Lopressor 12.5 mg twice a day    Controlled on 3/1 9. Dyslipidemia: Lipitor  10. History of rectal cancer with resection and chemotherapy.   Follow-up outpatient    Added Imodium for loose stools, encouraged use   Added probiotic as well to help with stool consistency   Fiber added on 2/20   Stable 11. Blindness.  Limited vision  12. Episodic bradycardia   Persistent   Asymptomatic    Cont to monitor   LOS (Days) 14 A FACE TO FACE EVALUATION WAS PERFORMED  Ankit Lorie Phenix, MD 04/07/2017 8:23 AM

## 2017-04-07 NOTE — Consult Note (Addendum)
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 elevated mean transit time largely corresponds to the infarcted area demonstrated on noncontrast CT and is mostly core infarct. Electronically Signed   By: Ulyses Jarred M.D.   On: 03/20/2017 22:49   Ct Cerebral Perfusion W Contrast  Addendum Date: 03/20/2017   ADDENDUM REPORT: 03/20/2017 21:18 ADDENDUM: This addendum is provided following telephone discussion with Dr. Roland Rack at 9:05 p.m. on and further comparison with the earlier performed noncontrast head CT. The noncontrast head CT shows extensive hypoattenuation throughout the right MCA distribution consistent with infarct. ASPECTS is 6. The lack of core infarct by CBF criteria is artifactual due to luxury perfusion. Electronically Signed   By: Ulyses Jarred M.D.   On: 03/20/2017 21:18   Result Date: 03/20/2017 CLINICAL DATA:  Unresponsive patient.  Abnormal head CT. EXAM: CT ANGIOGRAPHY HEAD AND NECK CT PERFUSION BRAIN TECHNIQUE: Multidetector CT imaging of the head and neck was performed using the standard protocol during bolus administration of intravenous contrast. Multiplanar CT image reconstructions and MIPs were obtained to evaluate the vascular anatomy. Carotid stenosis  measurements (when applicable) are obtained utilizing NASCET criteria, using the distal internal carotid diameter as the denominator. Multiphase CT imaging of the brain was performed following IV bolus contrast injection. Subsequent parametric perfusion maps were calculated using RAPID software. CONTRAST:  166mL ISOVUE-370 IOPAMIDOL (ISOVUE-370) INJECTION 76% COMPARISON:  Head CT 03/20/2017 FINDINGS: CTA NECK FINDINGS Aortic arch: There is no calcific atherosclerosis of the aortic arch. There is no aneurysm, dissection or hemodynamically significant stenosis of the visualized ascending aorta and aortic arch. Normal variant aortic arch branching pattern with the left vertebral artery arising independently from the aortic arch. The visualized proximal subclavian arteries are normal. Right carotid system: The right common carotid origin is widely patent. There is no common carotid or internal carotid artery dissection or aneurysm. No hemodynamically significant stenosis. Left carotid system: The left common carotid origin is widely patent. There is no common carotid or internal carotid artery dissection or aneurysm. No hemodynamically significant stenosis. Vertebral arteries: The vertebral system is codominant. Both vertebral artery origins are normal. Both vertebral arteries are normal to their confluence with the basilar artery. Skeleton: There is no bony spinal canal stenosis. No lytic or blastic lesions. Other neck: The nasopharynx is clear. The oropharynx and hypopharynx are normal. The epiglottis is normal. The supraglottic larynx, glottis and subglottic larynx are normal. No retropharyngeal collection. The parapharyngeal spaces are preserved. The parotid and submandibular glands are normal. No sialolithiasis or salivary ductal dilatation. The  thyroid gland is normal. There is no cervical lymphadenopathy. Upper chest: No pneumothorax or pleural effusion. No nodules or masses. Review of the MIP images confirms the  above findings CTA HEAD FINDINGS Anterior circulation: --Intracranial internal carotid arteries: Normal. --Anterior cerebral arteries: Normal. --Middle cerebral arteries: There is occlusion of the distal M1 segment of the right middle cerebral artery. There is intermediate collateralization in the right MCA territory. Normal left MCA. --Posterior communicating arteries: Absent bilaterally. Posterior circulation: --Posterior cerebral arteries: Normal. --Superior cerebellar arteries: Normal. --Basilar artery: Normal. --Anterior inferior cerebellar arteries: Normal. --Posterior inferior cerebellar arteries: Normal. Venous sinuses: As permitted by contrast timing, patent. Anatomic variants: None Delayed phase: No parenchymal contrast enhancement. Review of the MIP images confirms the above findings. CT Brain Perfusion Findings: CBF (<30%) Volume: 89mL Perfusion (Tmax>6.0s) volume: 182mL Mismatch Volume: 131mL Infarction Location:Right MCA territory IMPRESSION: 1. Emergent large vessel occlusion of the distal M 1 segment of the right middle cerebral artery with intermediate distal collateralization. 2. Ischemic penumbra volume of 101 mL without core infarction by cerebral blood flow criterion. 3. No hemorrhage or mass effect. 4. No occlusion or flow-limiting stenosis of the carotid or vertebral arteries. Critical Value/emergent results were called by telephone at the time of interpretation on 03/20/2017 at 8:20 pm to Dr. Karma Greaser , who verbally acknowledged these results. Electronically Signed: By: Ulyses Jarred M.D. On: 03/20/2017 20:33   Ct Head Code Stroke Wo Contrast  Result Date: 03/20/2017 CLINICAL DATA:  Code stroke. EXAM: CT HEAD WITHOUT CONTRAST TECHNIQUE: Contiguous axial images were obtained from the base of the skull through the vertex without intravenous contrast. COMPARISON:  03/20/2017 CT head FINDINGS: Brain: Right MCA distribution infarct with areas of hypoattenuation in the lateral right frontal lobe,  parietal lobe, and superior temporal lobe as well as the right insula. In comparison with the prior CT of the head there is a mild increase in edema and local mass effect. Stable minimal right-to-left midline shift and partial effacement of right lateral ventricle. No interval hemorrhage or stroke identified. Vascular: Persistent contrast is present within the vascular system. Skull: Normal. Negative for fracture or focal lesion. Sinuses/Orbits: No acute finding. Other: None. ASPECTS [facility] Stroke Program Early CT Score) - Ganglionic level infarction (caudate, lentiform nuclei, internal capsule, insula, M1-M3 cortex): 4 - Supraganglionic infarction (M4-M6 cortex): 1 Total score (0-10 with 10 being normal): 5 IMPRESSION: 1. Right MCA distribution late acute/subacute infarction. Mild interval increase in edema. Stable minimal right-to-left midline shift. No acute hemorrhage. 2. ASPECTS is 5 These results were called by telephone at the time of interpretation on 03/20/2017 at 10:16 pm to Dr. Roland Rack , who verbally acknowledged these results. Electronically Signed   By: Kristine Garbe M.D.   On: 03/20/2017 22:16    12-lead ECG sinus rhythm (personally reviewed) All prior EKG's in EPIC reviewed with no documented atrial fibrillation  Telemetry sinus rhythm - while admitted inpatient (personally reviewed)  Assessment and Plan:  1. Cryptogenic stroke The patient presents with cryptogenic stroke.  TEE was unremarkable.  I spoke at length with the patient about monitoring for afib with an implantable loop recorder.  Risks, benefits, and alteratives to implantable loop recorder were discussed with the patient today.   At this time, the patient is very clear in their decision to proceed with implantable loop recorder.   Wound care was reviewed with the patient (keep incision clean and dry for 3 days).  Wound check scheduled and entered in AVS.  Please call with questions.  Chanetta Marshall, NP 04/07/2017 7:33 AM  Pt left prior to being seen

## 2017-04-07 NOTE — Discharge Summary (Addendum)
[name] [name]               ACCOUNT NO.:  [id number]  MEDICAL RECORD NO.:  [id number]  LOCATION:  4M04C                        FACILITY:  [facility]  PHYSICIAN:  Delice Lesch, MD        DATE OF BIRTH:  1965-05-18  DATE OF ADMISSION:  03/24/2017 DATE OF DISCHARGE:  04/07/2017                              DISCHARGE SUMMARY   DISCHARGE DIAGNOSES: 1. Right middle cerebral artery infarction. 2. Subcutaneous Lovenox for deep vein thrombosis prophylaxis. 3. Pain management. 4. Hypertension. 5. Hyperlipidemia. 6. History of rectal cancer with resection and chemotherapy. 7. Blindness. 8. Episodic bradycardia, asymptomatic.  HISTORY OF PRESENT ILLNESS:  A 52 year old right-handed male with a history of hypertension, rectal cancer with resection, chemotherapy, as well as blindness.  He lives alone independent prior to admission.  He has a Mom and an aunt in the area.  Admitted March 20, 2017, with mental status changes.  CT of the head showed a right MCA infarction with slight mass effect.  CTA of the head revealed right MCA infarction. Emergent large vessel occlusion.  He did not receive tPA.  Repeats CTP showed core infarction and the patient is not a candidate for intervention. Echocardiogram with an ejection fraction of 63%, grade 1 diastolic dysfunction.  Maintained on aspirin therapy.  Subcutaneous Lovenox for DVT prophylaxis.  TEE showed ejection fraction of 65% without thrombus, no PFO.  Plan was for loop recorder as an outpatient. Bilateral lower extremity Dopplers, negative for DVT.  The patient was admitted for comprehensive rehab program.  PAST MEDICAL HISTORY:  See discharge diagnoses.  SOCIAL HISTORY:  Lives alone with local family.  FUNCTIONAL STATUS UPON ADMISSION TO REHAB SERVICES:  Minimal assist sit to stand, minimal assist sit to supine, min to mod assist activities of daily living.  PHYSICAL EXAMINATION:  VITAL SIGNS: Blood pressure 138/77, pulse  50, temperature 98, and respirations 18. GENERAL: Alert male, peripheral blindness. NECK: Supple.  Nontender.  No JVD. CARDIAC: Rate controlled. ABDOMEN: Soft, nontender.  Good bowel sounds. LUNGS: Clear to auscultation without wheeze.  REHABILITATION HOSPITAL COURSE:  The patient was admitted to Inpatient Rehab Services.  Therapies initiated on a 3-hour daily basis, consisting of physical therapy, occupational therapy, speech therapy, and rehabilitation nursing.  The following issues were addressed during the patient's rehabilitation stay.  Pertaining to [name] [name] right MCA infarction.  Plan was initially for loop recorder.  He was admitted to Inpatient Rehab Services first, context was made to Cardiology Services for loop recorder to be placed as an outpatient.  Subcutaneous Lovenox for DVT prophylaxis.  No bleeding episodes.  Venous Doppler studies negative.  He remained on aspirin for CVA prophylaxis.  Bouts of headaches maintained on Fioricet.  Blood pressures overall controlled on Lopressor.  History of rectal cancer resection, chemotherapy, follow up outpatient Oncology as prior to admission.  Noted peripheral blindness, limited vision.  He was living alone prior to admission.  The patient received weekly collaborative interdisciplinary team conferences to discuss estimated length of stay, family teaching, any barriers to discharge.  Transfers supine to sit, supervision.  Performed sit to stand, ambulation out of his room, minimal assistance for vision loss, ambulated throughout the unit from  room to day room while following directions and commands. Activities of daily living and homemaking with gathering of belongings.  Once in the toilet, he was able to manage all of his own clothing, complete total hygiene, supervision, minimal assist.  Instructions for sequencing and for rinsing his body completely off from soap.  He was tolerating a regular diet.  He could  communicate Basic needs.  Full family teaching was completed and plan discharge to home.  DISCHARGE MEDICATIONS:  Included aspirin 325 mg p.o. daily, Lipitor 80 mg p.o. daily, metoprolol 12.5 mg p.o. daily, FiberCon daily hold for loose stools, Florastor 250 mg p.o. b.i.d., Xanax 0.25 mg p.o. t.i.d. as needed, and Fioricet 1 tablet every 8 hours as needed for pain.  DIET:  His diet was regular.  FOLLOWUP:  He would follow up with Dr. Alysia Penna to the [place] as advised; Dr. Lyman Bishop call for appointment; Dr. Erlinda Hong, Neurology Services call for appointment in 6 weeks; Dr. Oliva Bustard, Oncology Services.  SPECIAL INSTRUCTIONS:  Arrangements being made for outpatient loop recorder placement, Cardiology had been contacted prior to the patient's departure from rehab services.     [name] [name], P.A.   ______________________________ Delice Lesch, MD    DA/MEDQ  D:  04/06/2017  T:  04/07/2017  Job:  [id number]  cc:   Dr. Linus Salmons [place] Dr. Lyman Bishop Delice Lesch, MD Dr. Terese Door

## 2017-04-07 NOTE — Progress Notes (Signed)
Late Note:  See previous note.  Upon discharge, plan for Loop prior to d/c home.  Pt became combative, agitated, and paranoid.  Discussed with PA.  Ultimately, loop cancelled.  Plan for outpatient follow with Cards for reeval.

## 2017-04-07 NOTE — Progress Notes (Signed)
Social Work  Discharge Note  The overall goal for the admission was met for:   Discharge location: Yes-HOME TO [place]  Length of Stay: Yes-14 DAYS  Discharge activity level: Yes-SUPERVISION-MIN ASSIST LEVEL  Home/community participation: Yes  Services provided included: MD, RD, PT, OT, SLP, RN, CM, TR, Pharmacy, Neuropsych and SW  Financial Services: Medicaid  Follow-up services arranged: Home Health: [facility]-PT,OT,SP, DME: [facility] AND [facility], Other: [facility] and Patient/Family has no preference for HH/DME agencies  Comments (or additional information):FAMILY EDUCATION COMPLETED AND ALL COMFORTABLE WITH HIS CARE.  Patient/Family verbalized understanding of follow-up arrangements: Yes  Individual responsible for coordination of the follow-up plan: SHANNON-GIRLFRIEND & MARY-MOM & VINCENT-UNCLE  Confirmed correct DME delivered: Elease Hashimoto 04/07/2017    Elease Hashimoto

## 2017-04-08 ENCOUNTER — Other Ambulatory Visit: Payer: Self-pay

## 2017-04-08 ENCOUNTER — Emergency Department (HOSPITAL_COMMUNITY): Payer: Medicaid Other

## 2017-04-08 ENCOUNTER — Emergency Department (HOSPITAL_COMMUNITY)
Admission: EM | Admit: 2017-04-08 | Discharge: 2017-04-08 | Disposition: A | Discharge status: Home / Self Care | Payer: Medicaid Other | Attending: Emergency Medicine | Admitting: Emergency Medicine

## 2017-04-08 ENCOUNTER — Encounter (HOSPITAL_COMMUNITY): Payer: Self-pay | Admitting: Emergency Medicine

## 2017-04-08 DIAGNOSIS — R410 Disorientation, unspecified: Secondary | ICD-10-CM | POA: Diagnosis not present

## 2017-04-08 DIAGNOSIS — Z8673 Personal history of transient ischemic attack (TIA), and cerebral infarction without residual deficits: Secondary | ICD-10-CM | POA: Diagnosis not present

## 2017-04-08 DIAGNOSIS — Z7982 Long term (current) use of aspirin: Secondary | ICD-10-CM | POA: Diagnosis not present

## 2017-04-08 DIAGNOSIS — Z79899 Other long term (current) drug therapy: Secondary | ICD-10-CM | POA: Insufficient documentation

## 2017-04-08 DIAGNOSIS — R4182 Altered mental status, unspecified: Secondary | ICD-10-CM | POA: Diagnosis present

## 2017-04-08 DIAGNOSIS — E785 Hyperlipidemia, unspecified: Secondary | ICD-10-CM | POA: Insufficient documentation

## 2017-04-08 DIAGNOSIS — I10 Essential (primary) hypertension: Secondary | ICD-10-CM | POA: Diagnosis not present

## 2017-04-08 HISTORY — DX: Cerebral infarction, unspecified: I63.9

## 2017-04-08 LAB — COMPREHENSIVE METABOLIC PANEL
ALT: 61 U/L (ref 17–63)
AST: 31 U/L (ref 15–41)
Albumin: 3.8 g/dL (ref 3.5–5.0)
Alkaline Phosphatase: 71 U/L (ref 38–126)
Anion gap: 12 (ref 5–15)
BILIRUBIN TOTAL: 0.8 mg/dL (ref 0.3–1.2)
BUN: 15 mg/dL (ref 6–20)
CO2: 22 mmol/L (ref 22–32)
CREATININE: 1.08 mg/dL (ref 0.61–1.24)
Calcium: 9 mg/dL (ref 8.9–10.3)
Chloride: 104 mmol/L (ref 101–111)
Glucose, Bld: 92 mg/dL (ref 65–99)
POTASSIUM: 3.8 mmol/L (ref 3.5–5.1)
Sodium: 138 mmol/L (ref 135–145)
TOTAL PROTEIN: 7.4 g/dL (ref 6.5–8.1)

## 2017-04-08 LAB — CBC WITH DIFFERENTIAL/PLATELET
BASOS ABS: 0 10*3/uL (ref 0.0–0.1)
Basophils Relative: 1 %
EOS ABS: 0.1 10*3/uL (ref 0.0–0.7)
EOS PCT: 3 %
HCT: 43.9 % (ref 39.0–52.0)
Hemoglobin: 14.5 g/dL (ref 13.0–17.0)
Lymphocytes Relative: 25 %
Lymphs Abs: 1.1 10*3/uL (ref 0.7–4.0)
MCH: 29.4 pg (ref 26.0–34.0)
MCHC: 33 g/dL (ref 30.0–36.0)
MCV: 88.9 fL (ref 78.0–100.0)
Monocytes Absolute: 0.3 10*3/uL (ref 0.1–1.0)
Monocytes Relative: 8 %
NEUTROS PCT: 63 %
Neutro Abs: 2.8 10*3/uL (ref 1.7–7.7)
PLATELETS: 209 10*3/uL (ref 150–400)
RBC: 4.94 MIL/uL (ref 4.22–5.81)
RDW: 14.9 % (ref 11.5–15.5)
WBC: 4.4 10*3/uL (ref 4.0–10.5)

## 2017-04-08 NOTE — Discharge Instructions (Signed)
Please follow up with the providers as listed in your discharge paperwork from yesterday. Schedule the home health consult visit.

## 2017-04-08 NOTE — ED Notes (Signed)
Patient transported to CT 
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
## 2017-04-08 NOTE — ED Triage Notes (Signed)
Pt in from home via [place] with [facility]. Per EMS, pt recently had stroke, was d/c'd from [facility] approx 1300 yesterday. Family states pt arrived home combative and "confused ever since, voiding on self". L side deficits present d/t CVA, hx of blindness. Pt a&ox2, VSS, CBG 108

## 2017-04-10 ENCOUNTER — Telehealth: Payer: Self-pay | Admitting: *Deleted

## 2017-04-10 NOTE — Telephone Encounter (Signed)
Transitional Care call-I spoke with his current caretaker Larene Beach (girlfriend,who he is staying with)    1. Are you/is patient experiencing any problems since coming home? Are there any questions regarding any aspect of care?  Some light headed ness and BP issues. They did go to ED 04/08/17 with concerns of confusion but there were no significant findings and the released him to back home. 2. Are there any questions regarding medications administration/dosing? Are meds being taken as prescribed? Patient should review meds with caller to confirm He has medications, HH has been out as well and reviewed. He has been having low BP (90's/50's) and discussed metoprolol.  She says it is making him sleepy so she is switching to giving it at bedtime but today his bp was still in 76O sysptolic.  When [facility] checked it was 115 systolic.   3. Have there been any falls? NO though some lightheadedness 4. Has Home Health been to the house and/or have they contacted you? If not, have you tried to contact them? Can we help you contact them? They have been out and did start of care visit 5. Are bowels and bladder emptying properly? Are there any unexpected incontinence issues? If applicable, is patient following bowel/bladder programs? NO 6. Any fevers, problems with breathing, unexpected pain? NO 7. Are there any skin problems or new areas of breakdown? NO 8. Has the patient/family member arranged specialty MD follow up (ie cardiology/neurology/renal/surgical/etc)?  Can we help arrange? appt to come to office and see Danella Sensing NP and then f/u with Dr Posey Pronto 9. Does the patient need any other services or support that we can help arrange? NO 10. Are caregivers following through as expected in assisting the patient? YES 11. Has the patient quit smoking, drinking alcohol, or using drugs as recommended? N/A  Appointment Tuesday 04/18/17 @1 :20 pm arrive by 1:00 to see Danella Sensing NP first then will see Posey Pronto at follow  up.  [place]

## 2017-04-10 NOTE — Telephone Encounter (Signed)
I spoke with Mr Manard girlfriend during TC call and she had concerns about his BP being low.  Systolic has been in 65'B and he has had lightheaded feelings. No falls. She also relayed metoprolol was making him sleepy after about 45 minutes and so she has switched to giving it to him at bedtime.  He is only taking 12.5 mg daily.  (also he returned to ED on 04/08/17 for confusion which has resolved and they did not keep him. BP at that time was 132/86 though pulse rate was 55)  Please advise.

## 2017-04-10 NOTE — Telephone Encounter (Signed)
I discussed that with Larene Beach and instructed her to contact them if more issues.

## 2017-04-10 NOTE — Telephone Encounter (Signed)
We did not change his medications for >1week prior to discharge.  That being said, if he is having low HR and BP, he should hold the metoprolol.  His confusion is multifactorial and he was agitated when he left the hospital.  He should have scheduled a follow up to see Cards, since he did not end up having his loop placed at discharge.  If there are further questions regarding BP meds, he can discuss it with them or PCP.  Thanks.

## 2017-04-11 ENCOUNTER — Telehealth: Payer: Self-pay

## 2017-04-11 NOTE — Telephone Encounter (Signed)
A PT [facility] called today with questions on this patients verbal orders.   Attempted to call her back, no answer, unsure if it is a confidential line so left message to call us back.

## 2017-04-12 NOTE — Telephone Encounter (Signed)
Spoke with [place], PT, [facility]. She said the patient is medicaid.  She completed her evaluation and determined that PT is not a priority with this patient given his limited visits with medicaid. She has conferred with speech and they feel speech therapy would be more important.  Speech will be performing an evaluation and contacting us with request for verbal orders to continue if warranted.  FYI

## 2017-04-17 ENCOUNTER — Telehealth: Payer: Self-pay | Admitting: *Deleted

## 2017-04-17 NOTE — Telephone Encounter (Signed)
Requesting VO for ST, approval given.  She will call back with frequency.

## 2017-04-18 ENCOUNTER — Other Ambulatory Visit: Payer: Self-pay

## 2017-04-18 ENCOUNTER — Encounter: Payer: Self-pay | Admitting: Registered Nurse

## 2017-04-18 ENCOUNTER — Encounter: Payer: Medicaid Other | Attending: Registered Nurse | Admitting: Registered Nurse

## 2017-04-18 VITALS — BP 136/89 | HR 87

## 2017-04-18 DIAGNOSIS — H547 Unspecified visual loss: Secondary | ICD-10-CM

## 2017-04-18 DIAGNOSIS — Z85048 Personal history of other malignant neoplasm of rectum, rectosigmoid junction, and anus: Secondary | ICD-10-CM | POA: Diagnosis not present

## 2017-04-18 DIAGNOSIS — Z8673 Personal history of transient ischemic attack (TIA), and cerebral infarction without residual deficits: Secondary | ICD-10-CM | POA: Diagnosis present

## 2017-04-18 DIAGNOSIS — I63511 Cerebral infarction due to unspecified occlusion or stenosis of right middle cerebral artery: Secondary | ICD-10-CM | POA: Diagnosis not present

## 2017-04-18 DIAGNOSIS — Z9889 Other specified postprocedural states: Secondary | ICD-10-CM | POA: Diagnosis not present

## 2017-04-18 DIAGNOSIS — I10 Essential (primary) hypertension: Secondary | ICD-10-CM | POA: Diagnosis not present
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
## 2017-04-27 ENCOUNTER — Telehealth: Payer: Self-pay | Admitting: *Deleted

## 2017-04-27 DIAGNOSIS — E785 Hyperlipidemia, unspecified: Secondary | ICD-10-CM | POA: Diagnosis not present

## 2017-04-27 DIAGNOSIS — I69322 Dysarthria following cerebral infarction: Secondary | ICD-10-CM | POA: Diagnosis not present

## 2017-04-27 DIAGNOSIS — I69354 Hemiplegia and hemiparesis following cerebral infarction affecting left non-dominant side: Secondary | ICD-10-CM | POA: Diagnosis not present

## 2017-04-27 DIAGNOSIS — Z95818 Presence of other cardiac implants and grafts: Secondary | ICD-10-CM | POA: Diagnosis not present

## 2017-04-27 DIAGNOSIS — I69320 Aphasia following cerebral infarction: Secondary | ICD-10-CM | POA: Diagnosis not present

## 2017-04-27 DIAGNOSIS — I69390 Apraxia following cerebral infarction: Secondary | ICD-10-CM | POA: Diagnosis not present

## 2017-04-27 DIAGNOSIS — Z85048 Personal history of other malignant neoplasm of rectum, rectosigmoid junction, and anus: Secondary | ICD-10-CM | POA: Diagnosis not present

## 2017-04-27 DIAGNOSIS — I10 Essential (primary) hypertension: Secondary | ICD-10-CM | POA: Diagnosis not present

## 2017-04-27 DIAGNOSIS — Z7982 Long term (current) use of aspirin: Secondary | ICD-10-CM | POA: Diagnosis not present

## 2017-04-27 DIAGNOSIS — H548 Legal blindness, as defined in USA: Secondary | ICD-10-CM | POA: Diagnosis not present

## 2017-04-27 DIAGNOSIS — R51 Headache: Secondary | ICD-10-CM | POA: Diagnosis not present

## 2017-04-27 NOTE — Telephone Encounter (Signed)
Speech therapist left a message asking for verbal orders for HHST for 3 visits total 2week1 followed by 1week1.  Verbal orders given per office protocol

## 2017-05-17 ENCOUNTER — Other Ambulatory Visit: Payer: Self-pay | Admitting: Internal Medicine

## 2017-05-24 ENCOUNTER — Ambulatory Visit: Payer: Self-pay | Admitting: Neurology

## 2017-05-24 ENCOUNTER — Encounter: Payer: Self-pay | Admitting: Neurology

## 2017-05-24 ENCOUNTER — Telehealth: Payer: Self-pay

## 2017-05-24 NOTE — Telephone Encounter (Signed)
Pt call cancel the same day had car trouble.

## 2017-05-25 ENCOUNTER — Encounter: Payer: Medicaid Other | Attending: Registered Nurse | Admitting: Physical Medicine & Rehabilitation

## 2017-05-25 DIAGNOSIS — H547 Unspecified visual loss: Secondary | ICD-10-CM | POA: Insufficient documentation

## 2017-05-25 DIAGNOSIS — Z9889 Other specified postprocedural states: Secondary | ICD-10-CM | POA: Insufficient documentation

## 2017-05-25 DIAGNOSIS — Z8673 Personal history of transient ischemic attack (TIA), and cerebral infarction without residual deficits: Secondary | ICD-10-CM | POA: Insufficient documentation

## 2017-05-25 DIAGNOSIS — I10 Essential (primary) hypertension: Secondary | ICD-10-CM | POA: Insufficient documentation

## 2017-05-25 DIAGNOSIS — Z85048 Personal history of other malignant neoplasm of rectum, rectosigmoid junction, and anus: Secondary | ICD-10-CM | POA: Insufficient documentation

## 2017-06-05 ENCOUNTER — Ambulatory Visit: Admission: RE | Admit: 2017-06-05 | Payer: Medicaid Other | Source: Ambulatory Visit

## 2017-06-05 ENCOUNTER — Telehealth: Payer: Self-pay | Admitting: *Deleted

## 2017-06-05 NOTE — Telephone Encounter (Signed)
Patient did not show up for his CT appointment today and is not answering his phone either

## 2017-06-06 ENCOUNTER — Telehealth: Payer: Self-pay | Admitting: Neurology

## 2017-06-06 ENCOUNTER — Encounter: Payer: Self-pay | Admitting: Neurology

## 2017-06-06 ENCOUNTER — Ambulatory Visit: Payer: Medicaid Other | Admitting: Neurology

## 2017-06-06 VITALS — BP 110/74 | HR 108 | Ht 74.0 in | Wt 211.0 lb

## 2017-06-06 DIAGNOSIS — H548 Legal blindness, as defined in USA: Secondary | ICD-10-CM | POA: Diagnosis not present

## 2017-06-06 DIAGNOSIS — E785 Hyperlipidemia, unspecified: Secondary | ICD-10-CM

## 2017-06-06 DIAGNOSIS — R451 Restlessness and agitation: Secondary | ICD-10-CM

## 2017-06-06 DIAGNOSIS — I63411 Cerebral infarction due to embolism of right middle cerebral artery: Secondary | ICD-10-CM

## 2017-06-06 DIAGNOSIS — F329 Major depressive disorder, single episode, unspecified: Secondary | ICD-10-CM

## 2017-06-06 DIAGNOSIS — F32A Depression, unspecified: Secondary | ICD-10-CM

## 2017-06-06 NOTE — Patient Instructions (Addendum)
-   continue ASA and lipitor for stroke prevention - will make sure you have home health nurse visit, home health speech therapy  - will refer to cardiology for heart monitoring - will refer to psychiatry for depression and agitation  - Follow up with your primary care physician for stroke risk factor modification. Recommend maintain blood pressure goal <130/80, diabetes with hemoglobin A1c goal below 7.0% and lipids with LDL cholesterol goal below 70 mg/dL.  - check BP at home with home health and record - compliant with medication and doctor's appointment - healthy diet and regular exercise - follow up in 3 months with [place].

## 2017-06-06 NOTE — Addendum Note (Signed)
Addended by: Rosalin Hawking on: 06/06/2017 02:32 PM   Modules accepted: Orders

## 2017-06-06 NOTE — Telephone Encounter (Signed)
Sent to [facility] with [employer]   [phone]  I relayed to [facility] to please offer all services she can for patient and to contact mother or sister scheduling. I have talked to patient's sister she is aware of details and she will be waiting for a telephone call and she will update her mother as well .
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
## 2017-06-06 NOTE — Telephone Encounter (Signed)
Called and Left message with patient and his mother asking what home health agency he is using.

## 2017-06-07 ENCOUNTER — Encounter: Payer: Self-pay | Admitting: Internal Medicine

## 2017-06-07 ENCOUNTER — Inpatient Hospital Stay: Payer: Medicaid Other

## 2017-06-07 ENCOUNTER — Inpatient Hospital Stay: Payer: Medicaid Other | Admitting: Internal Medicine

## 2017-06-07 NOTE — Assessment & Plan Note (Deleted)
s/p on neoadjuvant radiation chemotherapy- ypT3ypN1-stage III rectal cancer; negative margins. s/p adjuvant FOLFOX.   # Clinically no evidence of recurrence. CT scan [done in May 2018]; CT kidney protocol dec 2018- did not show any evidence of recurrence. CEA pending from today.   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  # Elevated Blood pressure/tachycardia- repeat 141/103; pulse-116 ? Compliance sec to ED; recommend taking norvasc atleast.  Prescription sent to pharmacy.  # port malfunction- ? Explantation if CT neg at next visit.   # follow up in 3 months/port flush/labs; CT scan a/p few days prior.
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
## 2017-06-19 ENCOUNTER — Telehealth: Payer: Self-pay | Admitting: *Deleted

## 2017-06-19 NOTE — Telephone Encounter (Signed)
Attempted to reach patient. Phone is not an active phone number. I left 2 msgs for patient's mom to return my phone call as well as patient's sister to discuss pt's apts.

## 2017-06-19 NOTE — Telephone Encounter (Signed)
-----   Message from Wilburn Cornelia sent at 06/19/2017  3:22 PM EDT ----- Regarding: please call today-has concerns Contact: [phone] Pt has had a stroke and has some concerns about his appt Wednesday for CT/LAB-MD- he stated he has a heart appt that AM as well.  He may be confused.

## 2017-06-20 NOTE — Telephone Encounter (Signed)
Multiple attempts have been made to reach this patient and return his phone call. The msg I rcvd is "The number you dialed is not a working #" I made an attempt at [phone] to reach the patient and I still received the same msg that the number is not working.  I contacted the patient's mother's mobile phone at [phone]. I had to leave a detailed vm. That pt continues to call regarding his apts. Patient has a ct scan schedule tom at 11 am as well as his f/us with Dr. Rogue Bussing in the afternoon. I asked the pt's mother to return our phone call and speak to the scheduling dept in the cancer center to further clarify apt times/dates.  I also contacted pt's mother's home at [phone] that was listed. "the person you have dialed can not take the call." I left a vm that requesting pt's mother to contact our office if [place] continues to have concerns about his apt times tom. I asked her to contact the scheduling teams.   ----- Message -----  From: Wilburn Cornelia  Sent: 06/20/2017  2:12 PM  To: Sabino Gasser, RN, Alric Quan, CMA, *  Subject: called again today                Please call pt back about his CT anf f/ups- hes called 2days in a row

## 2017-06-20 NOTE — Telephone Encounter (Signed)
Patient's mother returned my phone. Apt details were given to pt's mother and mother read back the apts.

## 2017-06-21 ENCOUNTER — Inpatient Hospital Stay: Payer: Medicaid Other | Admitting: Internal Medicine

## 2017-06-21 ENCOUNTER — Inpatient Hospital Stay: Payer: Medicaid Other

## 2017-06-21 ENCOUNTER — Ambulatory Visit: Admission: RE | Admit: 2017-06-21 | Payer: Medicaid Other | Source: Ambulatory Visit

## 2017-06-21 NOTE — Assessment & Plan Note (Deleted)
s/p on neoadjuvant radiation chemotherapy- ypT3ypN1-stage III rectal cancer; negative margins. s/p adjuvant FOLFOX.   # Clinically no evidence of recurrence. CT scan [done in May 2018]; CT kidney protocol dec 2018- did not show any evidence of recurrence. CEA pending from today.   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  # Elevated Blood pressure/tachycardia- repeat 141/103; pulse-116 ? Compliance sec to ED; recommend taking norvasc atleast.  Prescription sent to pharmacy.  # port malfunction- ? Explantation if CT neg at next visit.   # follow up in 3 months/port flush/labs; CT scan a/p few days prior.
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
## 2017-06-28 ENCOUNTER — Ambulatory Visit (INDEPENDENT_AMBULATORY_CARE_PROVIDER_SITE_OTHER): Payer: Medicaid Other | Admitting: Internal Medicine

## 2017-06-28 ENCOUNTER — Telehealth: Payer: Self-pay | Admitting: Internal Medicine

## 2017-06-28 ENCOUNTER — Encounter: Payer: Self-pay | Admitting: Internal Medicine

## 2017-06-28 VITALS — BP 120/100 | HR 56 | Ht 78.0 in | Wt 204.0 lb

## 2017-06-28 DIAGNOSIS — I10 Essential (primary) hypertension: Secondary | ICD-10-CM

## 2017-06-28 DIAGNOSIS — I63511 Cerebral infarction due to unspecified occlusion or stenosis of right middle cerebral artery: Secondary | ICD-10-CM

## 2017-06-28 MED ORDER — ASPIRIN EC 81 MG PO TBEC: 81.0000 mg | DELAYED_RELEASE_TABLET | Freq: Every day | ORAL | 3 refills | Status: DC

## 2017-06-28 NOTE — Patient Instructions (Addendum)
Medication Instructions:  Your physician has recommended you make the following change in your medication:   1. Decrease your Aspirin to 81mg  per day.  Labwork: None ordered.  Testing/Procedures: .Dr Caryl Comes advises you have a Linq recorder placed on June 5th  Follow-Up: Your physician recommends that you schedule a follow-up appointment in:   10-14 days after 6/5 for a wound check.  Follow up with Dr Caryl Comes as needed.    Please arrive at the [facility] main entrance of [place] hospital:  July 12, 2017 at 7:30am. You may eat, drink, and take your morning medications as normal the morning of your procedure.    Any Other Special Instructions Will Be Listed Below (If Applicable).     If you need a refill on your cardiac medications before your next appointment, please call your pharmacy.

## 2017-06-28 NOTE — Telephone Encounter (Signed)
Pt called to require about appt tried to contact him back and phone is not a working number.

## 2017-06-29 ENCOUNTER — Ambulatory Visit: Admission: RE | Admit: 2017-06-29 | Payer: Medicaid Other | Source: Ambulatory Visit

## 2017-06-30 ENCOUNTER — Inpatient Hospital Stay: Payer: Medicaid Other | Admitting: Internal Medicine

## 2017-06-30 NOTE — Assessment & Plan Note (Deleted)
s/p on neoadjuvant radiation chemotherapy- ypT3ypN1-stage III rectal cancer; negative margins. s/p adjuvant FOLFOX.   # Clinically no evidence of recurrence. CT scan [done in May 2018]; CT kidney protocol dec 2018- did not show any evidence of recurrence. CEA pending from today.   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  # Elevated Blood pressure/tachycardia- repeat 141/103; pulse-116 ? Compliance sec to ED; recommend taking norvasc atleast.  Prescription sent to pharmacy.  # port malfunction- ? Explantation if CT neg at next visit.   # follow up in 3 months/port flush/labs; CT scan a/p few days prior.

## 2017-07-05 ENCOUNTER — Telehealth: Payer: Self-pay | Admitting: *Deleted

## 2017-07-05 ENCOUNTER — Ambulatory Visit: Admission: RE | Admit: 2017-07-05 | Payer: Medicaid Other | Source: Ambulatory Visit

## 2017-07-05 NOTE — Telephone Encounter (Signed)
Patient contacted cancer center x 2 to discuss his apts. I personally spoke with the patient as he was "Highly concerned about the purpose of this apt." pt states that he was "anxious and scared that his cancer was returning and he wanted to know if his cancer came back. "Why is the doctor wanting this apt." I explained that this apt was his check up for this cancer. We do not know if his cancer has returned but we will order a scan to see determine this. I reiterated the importance of keeping his apts in the cancer center. I reiterated that he is responsible for his care. He has missed multiple apts with Dr. Rogue Bussing and cnl his ct scans in the last month. He gave verbal understanding of the purpose of the apt and was agreeable to schedule the ct scan. He is not able to go for the scan on 07/12/17.  Colette, please r/s patient's ct scan for next week and next available with NP/covering provider to go over the results per Dr. Jacinto Reap

## 2017-07-10 ENCOUNTER — Ambulatory Visit: Payer: Medicaid Other | Admitting: Oncology

## 2017-07-12 ENCOUNTER — Encounter (HOSPITAL_COMMUNITY): Admission: RE | Payer: Self-pay | Source: Ambulatory Visit

## 2017-07-12 ENCOUNTER — Ambulatory Visit (HOSPITAL_COMMUNITY): Admission: RE | Admit: 2017-07-12 | Payer: Medicaid Other | Source: Ambulatory Visit | Admitting: Internal Medicine

## 2017-07-12 SURGERY — LOOP RECORDER INSERTION

## 2017-07-12 MED ORDER — LIDOCAINE-EPINEPHRINE 1 %-1:100000 IJ SOLN
INTRAMUSCULAR | Status: AC
  Filled 2017-07-12: qty 1

## 2017-07-13 ENCOUNTER — Ambulatory Visit: Admission: RE | Admit: 2017-07-13 | Payer: Medicaid Other | Source: Ambulatory Visit

## 2017-07-14 ENCOUNTER — Telehealth: Payer: Self-pay | Admitting: *Deleted

## 2017-07-14 NOTE — Telephone Encounter (Signed)
I called and spoke with the patient's brother, Beverely Low (caregiver) to discuss LINQ implant to be done here at [facility].  The patient will be scheduled for Tuesday 08/01/17 at 8:30 am.  I have discussed the procedure with Beverely Low and advised him I will send a letter of instructions. He asked that I mail this to: [address], [place] [address]

## 2017-07-14 NOTE — Telephone Encounter (Signed)
-----   Message from [name] Berthold, NP sent at 07/12/2017  9:55 AM EDT ----- Dr Caryl Comes wants this patient's loop implant rescheduled to be done in [place]. Can you please call?

## 2017-07-17 ENCOUNTER — Ambulatory Visit: Payer: Medicaid Other | Admitting: Hematology and Oncology

## 2017-07-19 ENCOUNTER — Encounter: Payer: Self-pay | Admitting: *Deleted

## 2017-07-24 ENCOUNTER — Telehealth: Payer: Self-pay | Admitting: *Deleted

## 2017-07-24 ENCOUNTER — Ambulatory Visit: Payer: Medicaid Other

## 2017-07-24 NOTE — Telephone Encounter (Signed)
Patient did not show for appt.  Will forward information to Lorenda Hatchet, so patient's appt can be cx'd and R/S'd for 7-14 days post ILR implant.

## 2017-07-24 NOTE — Telephone Encounter (Signed)
LMTCB/sss  Patient needs to r/s his wound check appt for 10 days after his ILR implant.

## 2017-07-24 NOTE — Telephone Encounter (Signed)
LMTCB//sss 

## 2017-08-01 ENCOUNTER — Ambulatory Visit
Admission: RE | Admit: 2017-08-01 | Discharge: 2017-08-01 | Disposition: A | Discharge status: Home / Self Care | Payer: Medicaid Other | Source: Ambulatory Visit | Attending: Internal Medicine | Admitting: Internal Medicine

## 2017-08-01 ENCOUNTER — Encounter
Admission: RE | Disposition: A | Discharge status: Home / Self Care | Payer: Self-pay | Source: Ambulatory Visit | Attending: Internal Medicine

## 2017-08-01 DIAGNOSIS — H547 Unspecified visual loss: Secondary | ICD-10-CM | POA: Insufficient documentation

## 2017-08-01 DIAGNOSIS — Z800 Family history of malignant neoplasm of digestive organs: Secondary | ICD-10-CM | POA: Diagnosis not present

## 2017-08-01 DIAGNOSIS — C189 Malignant neoplasm of colon, unspecified: Secondary | ICD-10-CM | POA: Insufficient documentation

## 2017-08-01 DIAGNOSIS — Z9889 Other specified postprocedural states: Secondary | ICD-10-CM | POA: Insufficient documentation

## 2017-08-01 DIAGNOSIS — I10 Essential (primary) hypertension: Secondary | ICD-10-CM | POA: Insufficient documentation

## 2017-08-01 DIAGNOSIS — Z79899 Other long term (current) drug therapy: Secondary | ICD-10-CM | POA: Insufficient documentation

## 2017-08-01 DIAGNOSIS — Z888 Allergy status to other drugs, medicaments and biological substances status: Secondary | ICD-10-CM | POA: Diagnosis not present

## 2017-08-01 DIAGNOSIS — R7303 Prediabetes: Secondary | ICD-10-CM | POA: Insufficient documentation

## 2017-08-01 DIAGNOSIS — I639 Cerebral infarction, unspecified: Secondary | ICD-10-CM | POA: Diagnosis present

## 2017-08-01 DIAGNOSIS — Z902 Acquired absence of lung [part of]: Secondary | ICD-10-CM | POA: Insufficient documentation

## 2017-08-01 DIAGNOSIS — Z933 Colostomy status: Secondary | ICD-10-CM | POA: Insufficient documentation

## 2017-08-01 DIAGNOSIS — E785 Hyperlipidemia, unspecified: Secondary | ICD-10-CM | POA: Diagnosis not present

## 2017-08-01 DIAGNOSIS — Z885 Allergy status to narcotic agent status: Secondary | ICD-10-CM | POA: Insufficient documentation

## 2017-08-01 DIAGNOSIS — Z87828 Personal history of other (healed) physical injury and trauma: Secondary | ICD-10-CM | POA: Insufficient documentation

## 2017-08-01 DIAGNOSIS — Z7982 Long term (current) use of aspirin: Secondary | ICD-10-CM | POA: Diagnosis not present

## 2017-08-01 HISTORY — PX: LOOP RECORDER INSERTION: EP1214

## 2017-08-01 SURGERY — LOOP RECORDER INSERTION
Anesthesia: LOCAL

## 2017-08-01 MED ORDER — LIDOCAINE-EPINEPHRINE (PF) 1 %-1:200000 IJ SOLN
INTRAMUSCULAR | Status: AC
  Filled 2017-08-01: qty 30

## 2017-08-01 SURGICAL SUPPLY — 2 items
LOOP REVEAL LINQSYS (Prosthesis & Implant Heart) ×2 IMPLANT
PACK LOOP INSERTION (CUSTOM PROCEDURE TRAY) ×2 IMPLANT
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
## 2017-08-01 NOTE — Progress Notes (Signed)
Dr. Caryl Comes spoke with pt. & his mom re: loop implant procedure. Dannial Monarch, [employer] rep. Speaking & teaching with pt./mother re: device & bedside monitor. Both verbalizing understanding of teaching with verbalization.

## 2017-08-01 NOTE — Interval H&P Note (Signed)
History and Physical Interval Note:  08/01/2017 8:33 AM  [name] [name]  has presented today for surgery, with the diagnosis of Loop Recorder Insertion    BEDSIDE   8:30a Start    Stroke [employer] rep  The various methods of treatment have been discussed with the patient and family. After consideration of risks, benefits and other options for treatment, the patient has consented to  Procedure(s): LOOP RECORDER INSERTION (N/A) as a surgical intervention .  The patient's history has been reviewed, patient examined, no change in status, stable for surgery.  I have reviewed the patient's chart and labs.  Questions were answered to the patient's satisfaction.     Virl Axe

## 2017-08-14 ENCOUNTER — Ambulatory Visit: Payer: Medicaid Other

## 2017-08-15 ENCOUNTER — Ambulatory Visit (INDEPENDENT_AMBULATORY_CARE_PROVIDER_SITE_OTHER): Payer: Medicaid Other | Admitting: *Deleted

## 2017-08-15 ENCOUNTER — Telehealth: Payer: Self-pay | Admitting: Cardiology

## 2017-08-15 DIAGNOSIS — I639 Cerebral infarction, unspecified: Secondary | ICD-10-CM

## 2017-08-15 LAB — CUP PACEART INCLINIC DEVICE CHECK
Implantable Pulse Generator Implant Date: 20190625
MDC IDC SESS DTM: 20190709153633

## 2017-08-15 NOTE — Telephone Encounter (Signed)
Spoke w/ pt brother and requested that he send a manual transmission b/c his home monitor has not updated in at least 14 days.

## 2017-08-15 NOTE — Progress Notes (Signed)
Wound check in clinic s/p ILR implant. Steri strips removed. Wound well healed without redness or edema. Incision edges approximated. Normal device function. Battery status:GOOD. R-waves 1.7mV. 0 symptom episodes, 0 tachy episodes, 0 pause episodes, 0 brady episodes. 0 AF episodes (0% burden). Patient education completed including wound care, remote monitoring, and follow up. Monthly summary reports and ROV with SK/B in 12 months.

## 2017-08-22 ENCOUNTER — Telehealth: Payer: Self-pay | Admitting: *Deleted

## 2017-08-22 NOTE — Telephone Encounter (Signed)
Patient would like a call from Dr. Charlette Caffey nurse. He did not state the reason.

## 2017-08-22 NOTE — Telephone Encounter (Addendum)
Spoke with patient. He is ready to r/s his apt with Dr. Jacinto Reap. Declines further imaging until he sees Dr. Jacinto Reap. He only want to come on Fridays in am. Next apt on a Fri md available to see is August 2nd at 10:15 am. Pt would like this apt. Pt states that he is highly anxious and scared that he "may have cancer. This is why I have missed several apts." He stated that "if we order a scan, this means his cancer has returned."  reassurance provided to patient that the scans are to further evaluate and f/u on his cancer care. Pt begin to ramble on about being diagnosed with BH. His family member told him he had [place] cancer. What is BH cancer? I'm not wanting to come if I have [place] cancer? I went to [facility] and already had CT recently and had [place] cancer." reveiwed chart- no abd/pelvis ct scan imaging in epic performed in [place]." He noted "that he had a pace maker placed yesterday." pt then stated "I don't want my mother coming to any of my apts. I will get a dial a ride or a taxi to take me to my appointments."  Patient then changed the subject and stated that he only can come on Friday mornings. He stated that he was coming this Friday at [place]. I explained to him that the apt that was available was August 2nd not this Friday. He states ok. I'm coming this Friday at 9 am. Again, I explained that the apts was available on Friday, August 2nd at 10:15 am. He repeated this apt back to me multiple times while on the phone. Pt stated, "I don't want a scan. I just want an apt. I think that if I get a scan my cancer coming back and I got to see the doc and tell him this." I told the patient that this apt was just to see the doctor. HE will need to contact our office back if he can not keep these apts. Reiterated with patient that he has missed multiple apts already and ct scans. It is his responsibility to keep these apts. He also declined to have these apts sent to him in writing to his home.

## 2017-09-04 ENCOUNTER — Ambulatory Visit (INDEPENDENT_AMBULATORY_CARE_PROVIDER_SITE_OTHER): Payer: Medicaid Other | Admitting: *Deleted

## 2017-09-04 DIAGNOSIS — I639 Cerebral infarction, unspecified: Secondary | ICD-10-CM | POA: Diagnosis not present

## 2017-09-04 NOTE — Progress Notes (Signed)
Carelink Summary Report / Loop Recorder 

## 2017-09-05 ENCOUNTER — Encounter: Payer: Self-pay | Admitting: Adult Health

## 2017-09-05 ENCOUNTER — Ambulatory Visit: Payer: Medicaid Other | Admitting: Adult Health

## 2017-09-05 VITALS — BP 157/101 | HR 74 | Ht 78.0 in | Wt 200.8 lb

## 2017-09-05 DIAGNOSIS — I10 Essential (primary) hypertension: Secondary | ICD-10-CM | POA: Diagnosis not present

## 2017-09-05 DIAGNOSIS — I63511 Cerebral infarction due to unspecified occlusion or stenosis of right middle cerebral artery: Secondary | ICD-10-CM

## 2017-09-05 DIAGNOSIS — E785 Hyperlipidemia, unspecified: Secondary | ICD-10-CM

## 2017-09-05 NOTE — Patient Instructions (Signed)
Continue aspirin 81 mg daily  and lipitor  for secondary stroke prevention  Continue to follow up with PCP regarding cholesterol and blood pressure management   Continue to follow up with oncology as scheduled  We will continue to monitor loop recorder and you will be notified if we find atrial fibrillation  Continue to monitor blood pressure at home  Maintain strict control of hypertension with blood pressure goal below 130/90, diabetes with hemoglobin A1c goal below 6.5% and cholesterol with LDL cholesterol (bad cholesterol) goal below 70 mg/dL. I also advised the patient to eat a healthy diet with plenty of whole grains, cereals, fruits and vegetables, exercise regularly and maintain ideal body weight.  Followup in the future with me in 6 months or call earlier if needed        Thank you for coming to see [place] at [facility]. I hope we have been able to provide you high quality care today.  You may receive a patient satisfaction survey over the next few weeks. We would appreciate your feedback and comments so that we may continue to improve ourselves and the health of our patients.
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
## 2017-09-08 ENCOUNTER — Ambulatory Visit: Payer: Medicaid Other | Admitting: Internal Medicine

## 2017-09-18 ENCOUNTER — Inpatient Hospital Stay: Payer: Medicaid Other | Attending: Internal Medicine | Admitting: Internal Medicine

## 2017-09-18 ENCOUNTER — Encounter: Payer: Self-pay | Admitting: Internal Medicine

## 2017-09-18 ENCOUNTER — Other Ambulatory Visit: Payer: Self-pay

## 2017-09-18 VITALS — BP 150/90 | HR 88 | Temp 97.6°F | Resp 20 | Ht 72.0 in | Wt 201.2 lb

## 2017-09-18 DIAGNOSIS — E785 Hyperlipidemia, unspecified: Secondary | ICD-10-CM | POA: Insufficient documentation

## 2017-09-18 DIAGNOSIS — Z7982 Long term (current) use of aspirin: Secondary | ICD-10-CM | POA: Diagnosis not present

## 2017-09-18 DIAGNOSIS — R4701 Aphasia: Secondary | ICD-10-CM | POA: Diagnosis not present

## 2017-09-18 DIAGNOSIS — Z8673 Personal history of transient ischemic attack (TIA), and cerebral infarction without residual deficits: Secondary | ICD-10-CM | POA: Diagnosis not present

## 2017-09-18 DIAGNOSIS — F419 Anxiety disorder, unspecified: Secondary | ICD-10-CM

## 2017-09-18 DIAGNOSIS — Z923 Personal history of irradiation: Secondary | ICD-10-CM | POA: Diagnosis not present

## 2017-09-18 DIAGNOSIS — N200 Calculus of kidney: Secondary | ICD-10-CM | POA: Diagnosis not present

## 2017-09-18 DIAGNOSIS — I10 Essential (primary) hypertension: Secondary | ICD-10-CM | POA: Insufficient documentation

## 2017-09-18 DIAGNOSIS — C20 Malignant neoplasm of rectum: Secondary | ICD-10-CM | POA: Diagnosis present

## 2017-09-18 DIAGNOSIS — H547 Unspecified visual loss: Secondary | ICD-10-CM

## 2017-09-18 DIAGNOSIS — Z79899 Other long term (current) drug therapy: Secondary | ICD-10-CM | POA: Diagnosis not present

## 2017-09-18 DIAGNOSIS — Z9221 Personal history of antineoplastic chemotherapy: Secondary | ICD-10-CM | POA: Diagnosis not present

## 2017-09-18 DIAGNOSIS — Z933 Colostomy status: Secondary | ICD-10-CM

## 2017-09-18 DIAGNOSIS — Z800 Family history of malignant neoplasm of digestive organs: Secondary | ICD-10-CM

## 2017-09-18 NOTE — Progress Notes (Signed)
Patient here for follow-up for h/o rectal cancer. He has had multiple medical problems over the last year including a stroke and the need for pacemaker placement. Patient has missed multiple f/u in the clinic. He stated that he has missed his apts in the cancer center in "fear that his cancer has come back." He has refused previous imaging due to this anxiety and would like to discuss his care with Dr. Rogue Bussing.
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
## 2017-09-18 NOTE — Assessment & Plan Note (Addendum)
s/p on neoadjuvant radiation chemotherapy- ypT3ypN1-stage III rectal cancer; negative margins. s/p adjuvant FOLFOX.  Clinically stable.  #Recommend imaging as part of the surveillance program.  # Stroke/aphasia-new.  Stable.  Awaiting loop recorder.   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  # follow up TBD based on CT scan.   # 25 minutes face-to-face with the patient discussing the above plan of care; more than 50% of time spent on prognosis/ natural history; counseling and coordination.

## 2017-09-21 NOTE — Progress Notes (Signed)
I agree with the above plan 

## 2017-09-27 ENCOUNTER — Ambulatory Visit: Payer: Medicaid Other

## 2017-10-05 ENCOUNTER — Telehealth: Payer: Self-pay | Admitting: Adult Health

## 2017-10-05 DIAGNOSIS — Z79899 Other long term (current) drug therapy: Secondary | ICD-10-CM

## 2017-10-05 MED ORDER — ATORVASTATIN CALCIUM 80 MG PO TABS: 80.0000 mg | ORAL_TABLET | Freq: Every day | ORAL | 0 refills | Status: DC

## 2017-10-05 NOTE — Telephone Encounter (Signed)
Revised. 

## 2017-10-05 NOTE — Telephone Encounter (Addendum)
RN call patients pharmacy to find out who has been refilling all of pts medications. Rn call [facility] at [phone], and spoke with [place]. Rn ask who has been refilling pts medications. Lannette Donath stated pts norvasc was filled by Dr. Pernell Dupre in 05/2017 thru 08/2017. PTs lipitor was last refill 05/2017, and his lopressor was last refill 05/2017, and the potassium was last refill 2018.The pt has a potassium on hold from 05/2017 but has never pick it up. The potassium was refill by Pete Pelt R.pts oncologist.

## 2017-10-05 NOTE — Telephone Encounter (Signed)
Pt mother(on DPR) has called back, she is asking RN Katrina calls her back re: pt's medications.  Pt mother says she will keep phone near by.

## 2017-10-05 NOTE — Telephone Encounter (Signed)
RN call patients mom about refilling all of his meds. Rn ask mom on dpr who is her sons PCP. The mom stated he was going to the clinic in [place] but stop going. The pt sees our MD, and oncologist. [facility] stated pt does not have a PCP. Rn stated Janett Billow NP would be notified.

## 2017-10-05 NOTE — Telephone Encounter (Signed)
Please advise that PCP should continue to fill all medications. Thank you.

## 2017-10-05 NOTE — Addendum Note (Signed)
Addended by: Venancio Poisson on: 10/05/2017 01:29 PM   Modules accepted: Orders

## 2017-10-05 NOTE — Telephone Encounter (Signed)
Rn call patients mom back about refills for all his medications. Rn stated Janett Billow NP can only refill the lipitor. RN explain to pts mom that norvasc was prescribed Dr. Pernell Dupre at [facility] who did refills froom 05/2017 to 08/2017. Rn advised her to call that office. The mom verbalized understanding. Rn also stated the potassium was refill in 05/2017 by his oncologist Dr Rogue Bussing pt never pick it up. Rn also explain to mom that the lopressor, fibercon lipitor,was last refill in 05/2017. Pt did not have any refills on it.RN stated Janett Billow NP put a referral in for pt to establish with a primary doctor. The mom did not explain to nurse why pt last got most of his medications in 05/2017. The only med that was refill was the norvasc from different provider. RN reminded mom that [facility]itor [facility],and that a pcp referral was put in for him to establish. The mom verbalized understanding.

## 2017-10-05 NOTE — Telephone Encounter (Signed)
3 month refilled placed on lipitor but referral placed for internal medicine for need of PCP establishment for continue medical management of chronic conditions.

## 2017-10-05 NOTE — Telephone Encounter (Signed)
Pt mother(on DPR-[place],Mary [phone]) has called for a refill on all of pt's medications please send to   [facility], [place] [phone] (Phone) [phone] (Fax)

## 2017-10-06 ENCOUNTER — Telehealth: Payer: Self-pay

## 2017-10-06 ENCOUNTER — Ambulatory Visit (INDEPENDENT_AMBULATORY_CARE_PROVIDER_SITE_OTHER): Payer: Medicaid Other | Admitting: *Deleted

## 2017-10-06 DIAGNOSIS — I639 Cerebral infarction, unspecified: Secondary | ICD-10-CM | POA: Diagnosis not present

## 2017-10-06 NOTE — Telephone Encounter (Signed)
LMOVM requesting that pt send manual transmission b/c home monitor has not updated in at least 14 days.   Spoke w/ pt and requested that he send a manual transmission b/c his home monitor has not updated in at least 14 days.   

## 2017-10-07 NOTE — Progress Notes (Signed)
Carelink Summary Report / Loop Recorder 

## 2017-10-11 ENCOUNTER — Ambulatory Visit: Admission: RE | Admit: 2017-10-11 | Payer: Medicaid Other | Source: Ambulatory Visit

## 2017-10-17 LAB — CUP PACEART REMOTE DEVICE CHECK
Implantable Pulse Generator Implant Date: 20190625
MDC IDC SESS DTM: 20190728120648

## 2017-10-25 ENCOUNTER — Ambulatory Visit: Admission: RE | Admit: 2017-10-25 | Payer: Medicaid Other | Source: Ambulatory Visit

## 2017-11-01 ENCOUNTER — Ambulatory Visit: Payer: Medicaid Other

## 2017-11-01 LAB — CUP PACEART REMOTE DEVICE CHECK
Date Time Interrogation Session: 20190830121034
MDC IDC PG IMPLANT DT: 20190625

## 2017-11-08 ENCOUNTER — Ambulatory Visit (INDEPENDENT_AMBULATORY_CARE_PROVIDER_SITE_OTHER): Payer: Medicaid Other | Admitting: *Deleted

## 2017-11-08 DIAGNOSIS — I639 Cerebral infarction, unspecified: Secondary | ICD-10-CM | POA: Diagnosis not present

## 2017-11-09 ENCOUNTER — Emergency Department: Payer: Medicaid Other

## 2017-11-09 ENCOUNTER — Emergency Department
Admission: EM | Admit: 2017-11-09 | Discharge: 2017-11-09 | Disposition: A | Discharge status: Home / Self Care | Payer: Medicaid Other | Attending: Emergency Medicine | Admitting: Emergency Medicine

## 2017-11-09 ENCOUNTER — Other Ambulatory Visit: Payer: Self-pay

## 2017-11-09 ENCOUNTER — Encounter: Payer: Self-pay | Admitting: Emergency Medicine

## 2017-11-09 DIAGNOSIS — Z79899 Other long term (current) drug therapy: Secondary | ICD-10-CM | POA: Insufficient documentation

## 2017-11-09 DIAGNOSIS — R1084 Generalized abdominal pain: Secondary | ICD-10-CM

## 2017-11-09 DIAGNOSIS — I10 Essential (primary) hypertension: Secondary | ICD-10-CM | POA: Diagnosis not present

## 2017-11-09 DIAGNOSIS — K5900 Constipation, unspecified: Secondary | ICD-10-CM

## 2017-11-09 LAB — URINALYSIS, COMPLETE (UACMP) WITH MICROSCOPIC
BILIRUBIN URINE: NEGATIVE
Bacteria, UA: NONE SEEN
GLUCOSE, UA: NEGATIVE mg/dL
Ketones, ur: NEGATIVE mg/dL
LEUKOCYTES UA: NEGATIVE
NITRITE: NEGATIVE
PH: 6 (ref 5.0–8.0)
Protein, ur: NEGATIVE mg/dL
SPECIFIC GRAVITY, URINE: 1.019 (ref 1.005–1.030)
Squamous Epithelial / LPF: NONE SEEN (ref 0–5)

## 2017-11-09 LAB — CBC WITH DIFFERENTIAL/PLATELET
BASOS ABS: 0.1 10*3/uL (ref 0–0.1)
Basophils Relative: 1 %
EOS ABS: 0.1 10*3/uL (ref 0–0.7)
EOS PCT: 2 %
HCT: 46.4 % (ref 40.0–52.0)
Hemoglobin: 16.3 g/dL (ref 13.0–18.0)
Lymphocytes Relative: 32 %
Lymphs Abs: 1.8 10*3/uL (ref 1.0–3.6)
MCH: 31.4 pg (ref 26.0–34.0)
MCHC: 35.1 g/dL (ref 32.0–36.0)
MCV: 89.3 fL (ref 80.0–100.0)
Monocytes Absolute: 0.6 10*3/uL (ref 0.2–1.0)
Monocytes Relative: 10 %
Neutro Abs: 3.1 10*3/uL (ref 1.4–6.5)
Neutrophils Relative %: 55 %
PLATELETS: 187 10*3/uL (ref 150–440)
RBC: 5.2 MIL/uL (ref 4.40–5.90)
RDW: 14.8 % — AB (ref 11.5–14.5)
WBC: 5.6 10*3/uL (ref 3.8–10.6)

## 2017-11-09 LAB — URINE DRUG SCREEN, QUALITATIVE (ARMC ONLY)
Amphetamines, Ur Screen: NOT DETECTED
BARBITURATES, UR SCREEN: NOT DETECTED
BENZODIAZEPINE, UR SCRN: NOT DETECTED
CANNABINOID 50 NG, UR ~~LOC~~: NOT DETECTED
COCAINE METABOLITE, UR ~~LOC~~: NOT DETECTED
MDMA (ECSTASY) UR SCREEN: NOT DETECTED
Methadone Scn, Ur: NOT DETECTED
OPIATE, UR SCREEN: NOT DETECTED
Phencyclidine (PCP) Ur S: NOT DETECTED
TRICYCLIC, UR SCREEN: NOT DETECTED

## 2017-11-09 LAB — CUP PACEART REMOTE DEVICE CHECK
Date Time Interrogation Session: 20191002123749
MDC IDC PG IMPLANT DT: 20190625

## 2017-11-09 LAB — COMPREHENSIVE METABOLIC PANEL
ALT: 20 U/L (ref 0–44)
AST: 18 U/L (ref 15–41)
Albumin: 4.3 g/dL (ref 3.5–5.0)
Alkaline Phosphatase: 57 U/L (ref 38–126)
Anion gap: 8 (ref 5–15)
BUN: 11 mg/dL (ref 6–20)
CHLORIDE: 103 mmol/L (ref 98–111)
CO2: 28 mmol/L (ref 22–32)
CREATININE: 0.99 mg/dL (ref 0.61–1.24)
Calcium: 9.3 mg/dL (ref 8.9–10.3)
Glucose, Bld: 113 mg/dL — ABNORMAL HIGH (ref 70–99)
POTASSIUM: 3.3 mmol/L — AB (ref 3.5–5.1)
SODIUM: 139 mmol/L (ref 135–145)
Total Bilirubin: 0.7 mg/dL (ref 0.3–1.2)
Total Protein: 8 g/dL (ref 6.5–8.1)

## 2017-11-09 LAB — ETHANOL

## 2017-11-09 LAB — LACTIC ACID, PLASMA: Lactic Acid, Venous: 1.1 mmol/L (ref 0.5–1.9)

## 2017-11-09 LAB — LIPASE, BLOOD: LIPASE: 21 U/L (ref 11–51)

## 2017-11-09 LAB — TROPONIN I

## 2017-11-09 MED ORDER — IOPAMIDOL (ISOVUE-300) INJECTION 61%
30.0000 mL | Freq: Once | INTRAVENOUS | Status: AC | PRN
  Administered 2017-11-09: 30 mL via ORAL

## 2017-11-09 MED ORDER — ONDANSETRON HCL 4 MG/2ML IJ SOLN
4.0000 mg | Freq: Once | INTRAMUSCULAR | Status: AC
  Administered 2017-11-09: 4 mg via INTRAVENOUS

## 2017-11-09 MED ORDER — DICYCLOMINE HCL 20 MG PO TABS: 20.0000 mg | ORAL_TABLET | Freq: Four times a day (QID) | ORAL | 0 refills | Status: DC | PRN

## 2017-11-09 MED ORDER — SODIUM CHLORIDE 0.9 % IV BOLUS: 1000.0000 mL | Freq: Once | INTRAVENOUS | Status: DC

## 2017-11-09 MED ORDER — FENTANYL CITRATE (PF) 100 MCG/2ML IJ SOLN
50.0000 ug | Freq: Once | INTRAMUSCULAR | Status: AC
  Administered 2017-11-09: 50 ug via INTRAVENOUS

## 2017-11-09 MED ORDER — ONDANSETRON HCL 4 MG/2ML IJ SOLN
INTRAMUSCULAR | Status: AC
  Administered 2017-11-09: 4 mg via INTRAVENOUS
  Filled 2017-11-09: qty 2

## 2017-11-09 MED ORDER — SODIUM CHLORIDE 0.9 % IV BOLUS
1000.0000 mL | Freq: Once | INTRAVENOUS | Status: AC
  Administered 2017-11-09: 1000 mL via INTRAVENOUS

## 2017-11-09 MED ORDER — DICYCLOMINE HCL 20 MG PO TABS
20.0000 mg | ORAL_TABLET | Freq: Once | ORAL | Status: AC
  Administered 2017-11-09: 20 mg via ORAL
  Filled 2017-11-09: qty 1

## 2017-11-09 MED ORDER — FENTANYL CITRATE (PF) 100 MCG/2ML IJ SOLN
INTRAMUSCULAR | Status: AC
  Administered 2017-11-09: 50 ug via INTRAVENOUS
  Filled 2017-11-09: qty 2

## 2017-11-09 MED ORDER — LACTULOSE 10 GM/15ML PO SOLN
30.0000 g | Freq: Once | ORAL | Status: AC
  Administered 2017-11-09: 30 g via ORAL
  Filled 2017-11-09: qty 60

## 2017-11-09 MED ORDER — LACTULOSE 10 GM/15ML PO SOLN: 20.0000 g | Freq: Every day | ORAL | 0 refills | Status: DC | PRN

## 2017-11-09 MED ORDER — IOPAMIDOL (ISOVUE-300) INJECTION 61%
100.0000 mL | Freq: Once | INTRAVENOUS | Status: AC | PRN
  Administered 2017-11-09: 100 mL via INTRAVENOUS

## 2017-11-09 NOTE — ED Triage Notes (Signed)
Patient coming into night for high blood pressure, and abd pain and vomiting that has going on for 2 days. But he took a 2nd dose of BP medication tonight because it was over 175/102 but normally takes it once a day and has a hx of stroke this past feburary and has a pacemaker and states pulse has been low. And had a colostomy reversal last year.

## 2017-11-09 NOTE — ED Notes (Signed)
Pt is going to medical imaging.   

## 2017-11-09 NOTE — ED Notes (Signed)
Pt stated that he has been having abd pain with n/v/d for the past few days and pt has also noticed that his BP has been elevated.

## 2017-11-09 NOTE — Discharge Instructions (Addendum)
1.  You may take Lactulose as needed for bowel movements. 2.  You may take Bentyl as needed for abdominal discomfort. 3.  Return to the ER for worsening symptoms, persistent vomiting, difficulty breathing or other concerns. 
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
## 2017-11-09 NOTE — ED Notes (Signed)
Dr. Beather Arbour stated that 2nd liter was not needed at this time and to hold admin.

## 2017-11-09 NOTE — Progress Notes (Signed)
Carelink Summary Report / Loop Recorder 

## 2017-11-15 ENCOUNTER — Ambulatory Visit: Payer: Medicaid Other

## 2017-12-11 ENCOUNTER — Ambulatory Visit (INDEPENDENT_AMBULATORY_CARE_PROVIDER_SITE_OTHER): Payer: Medicaid Other | Admitting: *Deleted

## 2017-12-11 DIAGNOSIS — I639 Cerebral infarction, unspecified: Secondary | ICD-10-CM

## 2017-12-11 NOTE — Progress Notes (Signed)
Carelink Summary Report / Loop Recorder 
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
## 2017-12-22 IMAGING — DX DG CHEST 1V PORT
1 series · 2 of 2 positions shown · non-contrast
Comparison: CT chest 06/17/2015

CLINICAL DATA: Port-A-Cath placement

EXAM:
PORTABLE CHEST 1 VIEW

[Series 1: chest ap · 0.14mm/px · 2 of 2 slices shown]
[im 1/2]
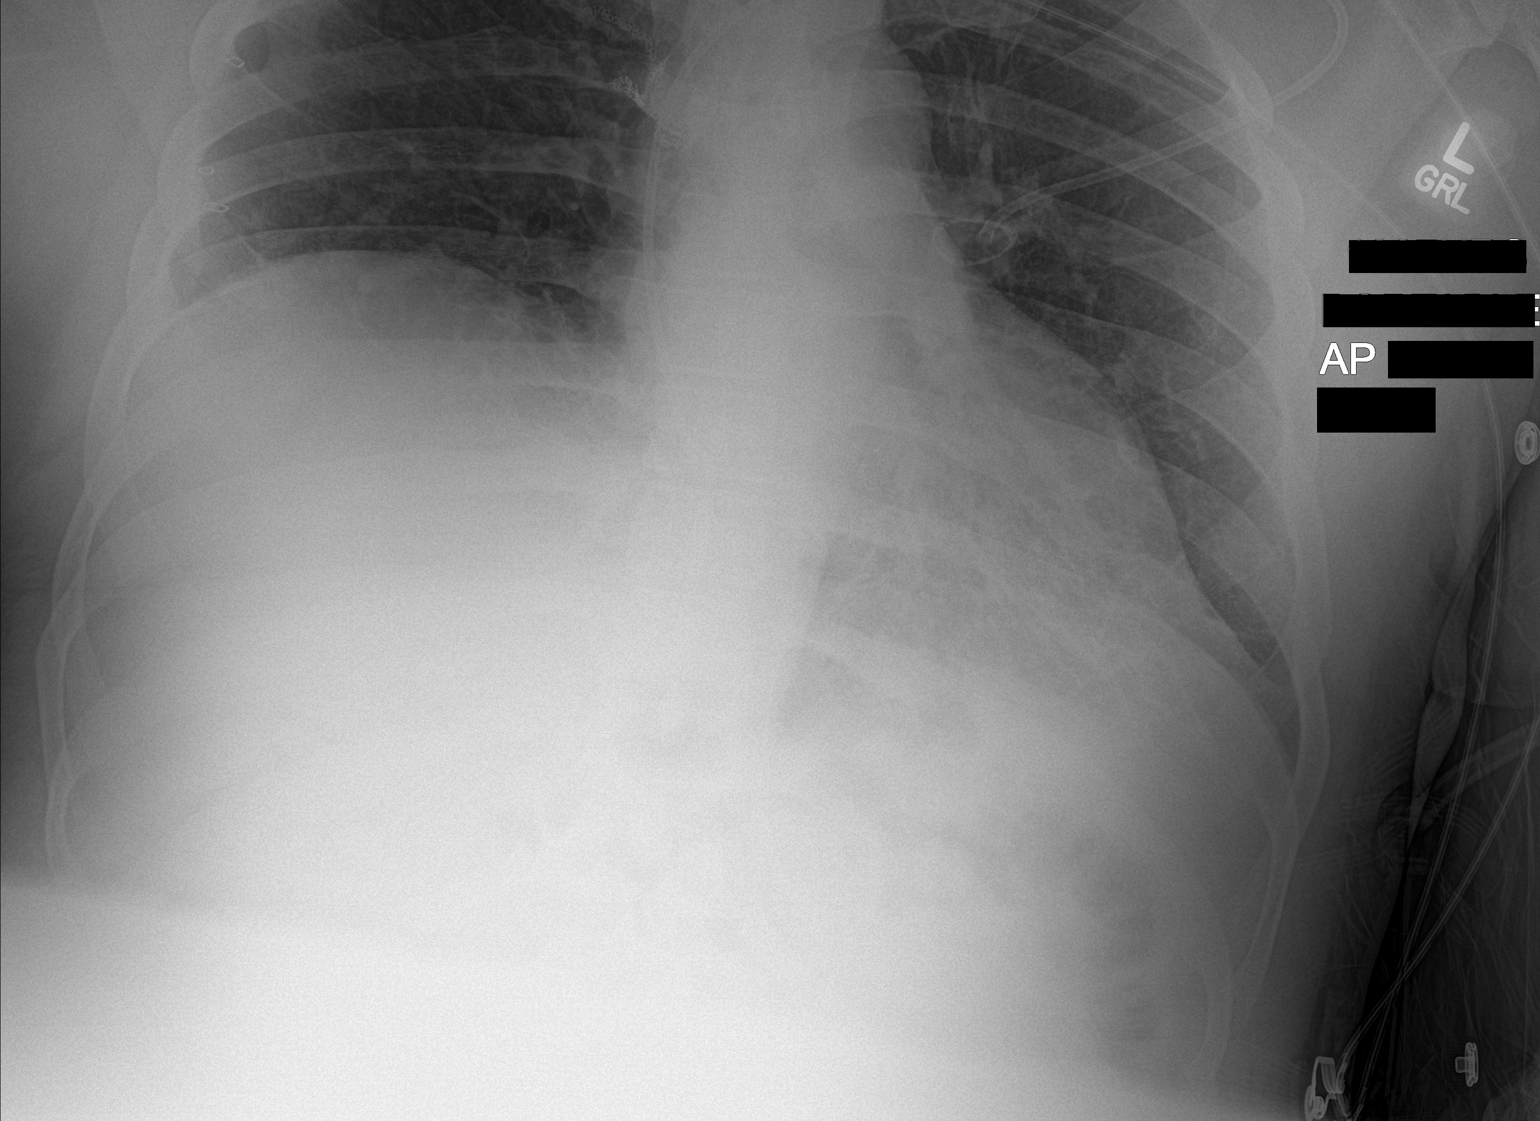
[im 2/2]
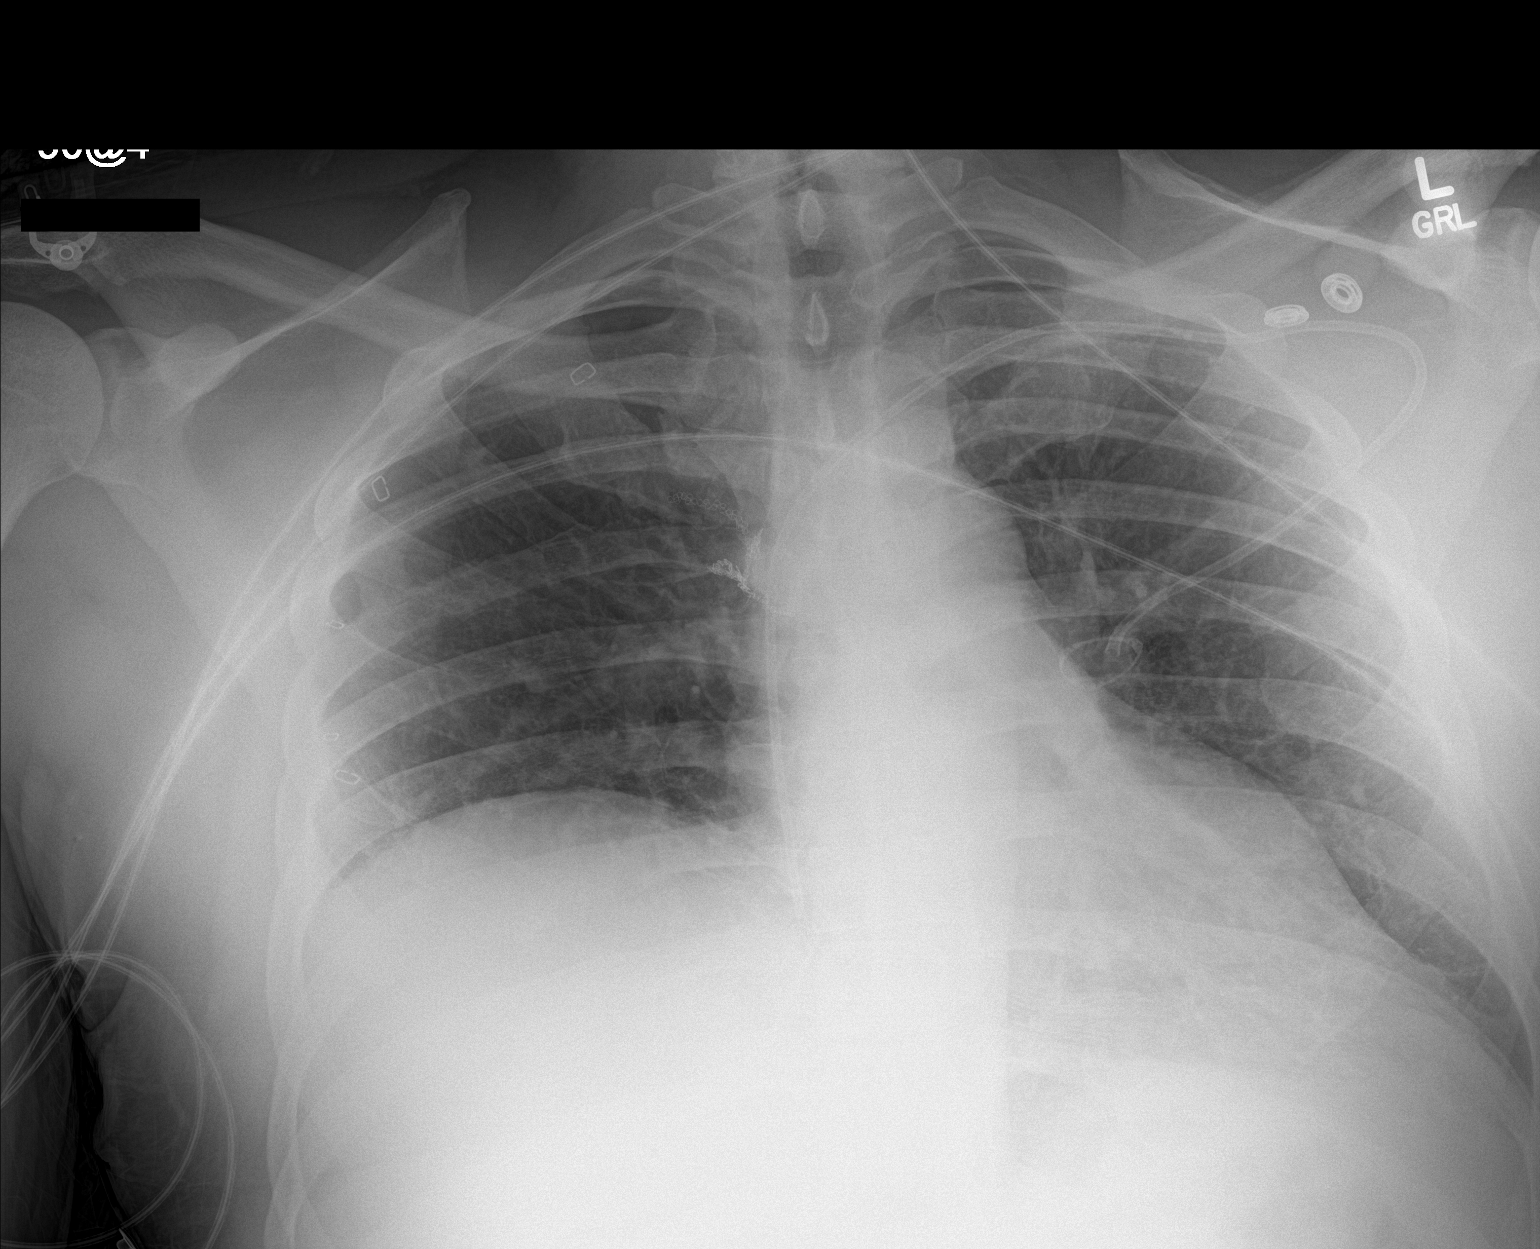

[2 of 2 positions shown; findings below may reference images not displayed]

FINDINGS: Left subclavian Port-A-Cath tip in the mid right atrium. No
pneumothorax.

Surgical clips in the right hilar region compatible with prior
lobectomy. Mild elevation right hemidiaphragm.

Mild atelectasis in the lung bases.  No effusion.
IMPRESSION: Port-A-Cath tip mid right atrium

Bibasilar atelectasis.  Postop changes right lung.

## 2018-01-01 ENCOUNTER — Telehealth: Payer: Self-pay | Admitting: Cardiology

## 2018-01-01 NOTE — Telephone Encounter (Signed)
Spoke w/ pt and requested that he send a manual transmission b/c his home monitor has not updated in at least 14 days.   

## 2018-01-15 ENCOUNTER — Ambulatory Visit (INDEPENDENT_AMBULATORY_CARE_PROVIDER_SITE_OTHER): Payer: Medicaid Other

## 2018-01-15 DIAGNOSIS — I639 Cerebral infarction, unspecified: Secondary | ICD-10-CM

## 2018-01-17 NOTE — Progress Notes (Signed)
Carelink Summary Report / Loop Recorder 

## 2018-02-01 ENCOUNTER — Other Ambulatory Visit: Payer: Self-pay | Admitting: Internal Medicine

## 2018-02-03 LAB — CUP PACEART REMOTE DEVICE CHECK
Date Time Interrogation Session: 20191104144016
Implantable Pulse Generator Implant Date: 20190625

## 2018-02-08 ENCOUNTER — Other Ambulatory Visit: Payer: Self-pay | Admitting: Internal Medicine

## 2018-02-15 ENCOUNTER — Ambulatory Visit (INDEPENDENT_AMBULATORY_CARE_PROVIDER_SITE_OTHER): Payer: Medicaid Other

## 2018-02-15 DIAGNOSIS — I639 Cerebral infarction, unspecified: Secondary | ICD-10-CM | POA: Diagnosis not present

## 2018-02-16 NOTE — Progress Notes (Signed)
Carelink Summary Report / Loop Recorder 

## 2018-02-17 LAB — CUP PACEART REMOTE DEVICE CHECK
Implantable Pulse Generator Implant Date: 20190625
MDC IDC SESS DTM: 20200111064748

## 2018-02-25 LAB — CUP PACEART REMOTE DEVICE CHECK
Date Time Interrogation Session: 20191207150602
MDC IDC PG IMPLANT DT: 20190625

## 2018-03-01 ENCOUNTER — Telehealth: Payer: Self-pay | Admitting: Internal Medicine

## 2018-03-01 NOTE — Telephone Encounter (Signed)
 *  STAT* If patient is at the pharmacy, call can be transferred to refill team.   1. Which medications need to be refilled? (please list name of each medication and dose if known)  atorvastatin (LIPITOR) 80 MG tablet potassium chloride SA (K-DUR,KLOR-CON) 20 MEQ tablet metoprolol tartrate (LOPRESSOR) 25 MG tablet amLODipine (NORVASC) 5 MG tablet   2. Which pharmacy/location (including street and city if local pharmacy) is medication to be sent to? [place]  3. Do they need a 30 day or 90 day supply? [place]

## 2018-03-01 NOTE — Telephone Encounter (Signed)
Pt would like a refill on Atorvastatin, Potassium, Metoprolol, and Amlodipine. Would you like to refill these medications? Please address. Thank you

## 2018-03-05 ENCOUNTER — Other Ambulatory Visit: Payer: Self-pay

## 2018-03-05 ENCOUNTER — Telehealth: Payer: Self-pay | Admitting: Adult Health

## 2018-03-05 MED ORDER — ATORVASTATIN CALCIUM 80 MG PO TABS: 80.0000 mg | ORAL_TABLET | Freq: Every day | ORAL | 0 refills | Status: DC

## 2018-03-05 NOTE — Telephone Encounter (Signed)
Pt has called for a refill on  amLODipine (NORVASC) 5 MG tablet & atorvastatin (LIPITOR) 80 MG tablet Pt asking it be sent to [place] [phone] Pt states he has been without medication for about 2 days. Pt states there have been no changes to his insurance

## 2018-03-05 NOTE — Telephone Encounter (Addendum)
I called patient that Janett Billow NP will only refilled his lipitor medication. I explain when we spoke with his mom and him 09/2017 to call his primary doctor to get refills, and make an appt. Pt stated he will call his primary doctor and make an appt to manage his blood pressure medications. I explain a refill was sent to his pharmacy for lipitor. Pt verbalized the need to call his PCP about his blood pressure medications.

## 2018-03-05 NOTE — Telephone Encounter (Signed)
I spoke with Janett Billow NP that pt wanted refills on his lipitor and norvasc. Per Janett Billow NP she can refilled his lipitor for 30 days. Pt has an appt with our office on 03/08/2018 for stroke follow up. Per Janett Billow NP he needs to call his PCP for refills on his blood pressure medications. I will call patient about Janett Billow NP recommendations.

## 2018-03-05 NOTE — Telephone Encounter (Signed)
Revised. 

## 2018-03-08 ENCOUNTER — Ambulatory Visit: Payer: Medicaid Other | Admitting: Adult Health

## 2018-03-08 ENCOUNTER — Encounter: Payer: Self-pay | Admitting: Adult Health

## 2018-03-08 VITALS — BP 138/105 | HR 74 | Ht 78.0 in | Wt 214.6 lb

## 2018-03-08 DIAGNOSIS — R7303 Prediabetes: Secondary | ICD-10-CM | POA: Diagnosis not present

## 2018-03-08 DIAGNOSIS — I63511 Cerebral infarction due to unspecified occlusion or stenosis of right middle cerebral artery: Secondary | ICD-10-CM | POA: Diagnosis not present

## 2018-03-08 DIAGNOSIS — I10 Essential (primary) hypertension: Secondary | ICD-10-CM | POA: Diagnosis not present

## 2018-03-08 DIAGNOSIS — E785 Hyperlipidemia, unspecified: Secondary | ICD-10-CM

## 2018-03-08 NOTE — Patient Instructions (Addendum)
Continue aspirin 81 mg daily  and lipitor  for secondary stroke prevention - request PCP refill all prescriptions   We will check cholesterol levels and A1c today  Continue to follow up with PCP regarding cholesterol and blood pressure management along with speaking to them regarding continued stomach concerns with constipation and diarrhea for possible need of GI referral if needed or schedule f/u appointment with oncology   Continue to stay active and maintain a healthy diet  Continue to monitor blood pressure at home  Maintain strict control of hypertension with blood pressure goal below 130/90, diabetes with hemoglobin A1c goal below 6.5% and cholesterol with LDL cholesterol (bad cholesterol) goal below 70 mg/dL. I also advised the patient to eat a healthy diet with plenty of whole grains, cereals, fruits and vegetables, exercise regularly and maintain ideal body weight.  Followup in the future with me as needed or call earlier if needed       Thank you for coming to see [place] at [facility]. I hope we have been able to provide you high quality care today.  You may receive a patient satisfaction survey over the next few weeks. We would appreciate your feedback and comments so that we may continue to improve ourselves and the health of our patients.

## 2018-03-08 NOTE — Progress Notes (Signed)
I agree with the above plan 
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
## 2018-03-09 LAB — LIPID PANEL
Chol/HDL Ratio: 3.1 ratio (ref 0.0–5.0)
Cholesterol, Total: 124 mg/dL (ref 100–199)
HDL: 40 mg/dL (ref >39)
LDL CALC: 72 mg/dL (ref 0–99)
Triglycerides: 62 mg/dL (ref 0–149)
VLDL Cholesterol Cal: 12 mg/dL (ref 5–40)

## 2018-03-09 LAB — HEMOGLOBIN A1C
Est. average glucose Bld gHb Est-mCnc: 117 mg/dL
HEMOGLOBIN A1C: 5.7 % — AB (ref 4.8–5.6)

## 2018-03-12 ENCOUNTER — Telehealth: Payer: Self-pay

## 2018-03-12 NOTE — Telephone Encounter (Signed)
Patient contacted our office and was worried that he was getting a new doctor. He stated that he wanted to stay in [place] location with Dr. Jacinto Reap - and that he did not want to go to our [place] location. I advised patient that we will no longer going to [place] after 03/13/17, and that we will be here in [facility], and that he will still be able to see Dr. Jacinto Reap. Patient verbalized understanding.  Patient then stated that he had a stroke back in October and he is following up with neurology. He states he has an appointment with Venancio Poisson coming up, and he wants to find out her recommendations on when he should follow up with Dr. Jacinto Reap.  Patient has not been seen since August, and it was advised by Dr. B that he follow up after CT scan. I offered patient an appt but he declined, as he said he wants to follow up with neurology first. He states he will call back next week. Will route to Dr. Jacinto Reap.

## 2018-03-20 ENCOUNTER — Ambulatory Visit (INDEPENDENT_AMBULATORY_CARE_PROVIDER_SITE_OTHER): Payer: Medicaid Other

## 2018-03-20 DIAGNOSIS — I639 Cerebral infarction, unspecified: Secondary | ICD-10-CM

## 2018-03-20 LAB — CUP PACEART REMOTE DEVICE CHECK
Date Time Interrogation Session: 20200211164011
Implantable Pulse Generator Implant Date: 20190625

## 2018-03-30 ENCOUNTER — Other Ambulatory Visit: Payer: Self-pay | Admitting: Neurology

## 2018-03-30 ENCOUNTER — Telehealth: Payer: Self-pay | Admitting: Neurology

## 2018-03-30 MED ORDER — AMLODIPINE BESYLATE 5 MG PO TABS: 5.0000 mg | ORAL_TABLET | Freq: Every morning | ORAL | 1 refills | Status: DC

## 2018-03-30 NOTE — Telephone Encounter (Signed)
I returned call from patient who requested refill for norvasc, I ordered refill electronically to his listed pharmacy

## 2018-04-02 NOTE — Progress Notes (Signed)
Carelink Summary Report / Loop Recorder 

## 2018-04-11 NOTE — Telephone Encounter (Signed)
Future refills to be done by PCP per Dr. Leonie Man.

## 2018-04-16 ENCOUNTER — Telehealth: Payer: Self-pay

## 2018-04-16 NOTE — Telephone Encounter (Signed)
I receive a fax from [facility] that pt is requesting a lightweight wheelchair. PEr Janett Billow NP last two notes. There is no mention of pt needing wheelchair. Pt is able to walk normal without assistance. When pt comes to visits he is ambulatory without assistance. The form is dated from 09/2017. There is no referral in the system from [place] NP about recommend a wheelchair.

## 2018-04-16 NOTE — Telephone Encounter (Signed)
Tried to call [facility] about fax needing [name] Billow NP signature for light weight wheelchair. Need to know has there been a change with pt. Pt was seen in January 2020 and [name] Billow NP note does not mention a wheelchair. I was hold for 20 minutes will call tomorrow.

## 2018-04-17 NOTE — Telephone Encounter (Signed)
Late entry. I call [facility] to speak with staff this am at 1000am. I was on hold for 8 minutes. I will try to call tomorrow to discuss why the form is dated 09/2017.Also need to know if pt has had another stroke and is wheelchair bound. I see not notes in epic or careverywhere. PEr notes pt is able to walk without assistance.

## 2018-04-18 NOTE — Telephone Encounter (Addendum)
I spoke with Janett Billow NP that pt has a fax from [facility] wanting to rent a light weight wheelchair. The form is dated back to 09/2017. PEr Janett Billow NP she cannot justified a light weight wheelchair for pt based on her neuro assessments from her last two visits. Form will not be done. I tried a third time to speak with someone at [facility] about form. I again was on hold for 7 minutes. Form will be fax stating it cannot be done or approve.Form was fax to [phone] stating we cannot authorized a wheelchair for pt.

## 2018-04-23 ENCOUNTER — Other Ambulatory Visit: Payer: Self-pay

## 2018-04-23 ENCOUNTER — Ambulatory Visit (INDEPENDENT_AMBULATORY_CARE_PROVIDER_SITE_OTHER): Payer: Medicaid Other | Admitting: *Deleted

## 2018-04-23 DIAGNOSIS — I639 Cerebral infarction, unspecified: Secondary | ICD-10-CM | POA: Diagnosis not present

## 2018-04-24 LAB — CUP PACEART REMOTE DEVICE CHECK
Date Time Interrogation Session: 20200315163948
Implantable Pulse Generator Implant Date: 20190625

## 2018-05-01 NOTE — Progress Notes (Signed)
Carelink Summary Report / Loop Recorder 

## 2018-05-10 ENCOUNTER — Telehealth: Payer: Self-pay | Admitting: *Deleted

## 2018-05-10 DIAGNOSIS — C20 Malignant neoplasm of rectum: Secondary | ICD-10-CM

## 2018-05-10 NOTE — Telephone Encounter (Signed)
Patient is calling to request an apt for a ct scan and f/u apt with Dr. Jacinto Reap. Last phone call received per chart was in February. Brooke, CMA spoke w/ patient at that time. He was not interested in f/u then and wanted to wait until his neurology apts. Pt states that he is now walking better and talking better s/p PT.  He would be interested in having apts set up in May if possible.  Dr. B do you think he would be candidate for phone interview given his blindness and other health issues to review results?  Pt is 'concerned about covid' risk factors.  Please review chart and provide feedback.

## 2018-05-10 NOTE — Telephone Encounter (Signed)
Pt can follow up with me in mid - May 2020. Labs- CBC/cmp/cea/ ? Port flush. And tele visit 2 days later.   GB

## 2018-05-11 NOTE — Addendum Note (Signed)
Addended by: Sabino Gasser on: 05/11/2018 08:14 AM   Modules accepted: Orders

## 2018-05-17 ENCOUNTER — Telehealth: Payer: Self-pay | Admitting: *Deleted

## 2018-05-17 ENCOUNTER — Telehealth: Payer: Self-pay

## 2018-05-17 NOTE — Telephone Encounter (Signed)
I called pt back about his medication. Pt stated he needs lipitor refills. I stated he was told at last visit on 03/08/2018,a telephone call 1/27, and 09/2017 that his primary has to manage his cholesterol medications. PT was given a one time refill in January and told via phone to follow up with PCP for future refills.I explained he was told several times to follow up with PCP for blood pressure and cholesterol. Pt stated " im not going to lie, I think I saw them 4 months ago'. The nurse could not clarify if pt follow up with PCP or not. I advise pt he was release back to his PCP and has no more follow up at our office. Pt was advise to call PCP for refills. He verbalized understanding.

## 2018-05-17 NOTE — Telephone Encounter (Signed)
Pt LVM has question about his medication, did not leave the name of the medication. Please call [phone]

## 2018-05-17 NOTE — Telephone Encounter (Signed)
Pt needs to call his PCP to manage his norvasc and Lipitor medications ongoing.See Janett Billow NP note.

## 2018-05-25 ENCOUNTER — Ambulatory Visit (INDEPENDENT_AMBULATORY_CARE_PROVIDER_SITE_OTHER): Payer: Medicaid Other | Admitting: *Deleted

## 2018-05-25 ENCOUNTER — Other Ambulatory Visit: Payer: Self-pay

## 2018-05-25 DIAGNOSIS — I639 Cerebral infarction, unspecified: Secondary | ICD-10-CM

## 2018-05-25 DIAGNOSIS — I63511 Cerebral infarction due to unspecified occlusion or stenosis of right middle cerebral artery: Secondary | ICD-10-CM

## 2018-05-25 LAB — CUP PACEART REMOTE DEVICE CHECK
Date Time Interrogation Session: 20200417174011
Implantable Pulse Generator Implant Date: 20190625

## 2018-05-28 ENCOUNTER — Telehealth: Payer: Self-pay | Admitting: *Deleted

## 2018-05-28 NOTE — Telephone Encounter (Signed)
Patient called about his rescheduled appointment dates and times.  Discussed [employer] with pt and his mother.  Mother, Larene Beach has smart phone and would like MD visit on 5/15 to be done by [employer], on phone number [phone]. He will keep lab appt on 5/14.

## 2018-05-29 ENCOUNTER — Other Ambulatory Visit: Payer: Self-pay | Admitting: Neurology

## 2018-05-29 NOTE — Telephone Encounter (Signed)
Refill for amlodipine per pharmacy refused to be renewed by pcp.

## 2018-05-30 NOTE — Progress Notes (Signed)
Carelink Summary Report / Loop Recorder 

## 2018-06-20 ENCOUNTER — Other Ambulatory Visit: Payer: Self-pay

## 2018-06-21 ENCOUNTER — Inpatient Hospital Stay: Payer: Medicaid Other

## 2018-06-22 ENCOUNTER — Inpatient Hospital Stay: Payer: Medicaid Other | Admitting: Internal Medicine

## 2018-06-26 ENCOUNTER — Telehealth: Payer: Self-pay | Admitting: Neurology

## 2018-06-26 NOTE — Telephone Encounter (Signed)
Pt wants a call back about a medication refill dg

## 2018-06-26 NOTE — Telephone Encounter (Signed)
If pt calls back he was release back to his primary doctor in January 2020 by Janett Billow NP. He has no more appts at our office. He has to call his primary doctor to discuss any medications. It was discuss at visit in January 2020 with Janett Billow NP and also 03/30/2018 and 05/17/2018 telephone calls. Pt does not need an appt for medications.  VM was left for pt to call primary doctor for all his medications management. He is to only follow up as needed .

## 2018-06-27 ENCOUNTER — Ambulatory Visit (INDEPENDENT_AMBULATORY_CARE_PROVIDER_SITE_OTHER): Payer: Medicaid Other | Admitting: *Deleted

## 2018-06-27 DIAGNOSIS — I639 Cerebral infarction, unspecified: Secondary | ICD-10-CM | POA: Diagnosis not present

## 2018-06-27 LAB — CUP PACEART REMOTE DEVICE CHECK
Date Time Interrogation Session: 20200520173900
Implantable Pulse Generator Implant Date: 20190625

## 2018-07-06 NOTE — Progress Notes (Signed)
Carelink Summary Report / Loop Recorder 

## 2018-07-10 ENCOUNTER — Encounter (HOSPITAL_COMMUNITY): Payer: Self-pay

## 2018-07-10 ENCOUNTER — Emergency Department (HOSPITAL_COMMUNITY)
Admission: EM | Admit: 2018-07-10 | Discharge: 2018-07-10 | Disposition: A | Discharge status: Home / Self Care | Payer: Medicaid Other | Attending: Emergency Medicine | Admitting: Emergency Medicine

## 2018-07-10 DIAGNOSIS — Z85048 Personal history of other malignant neoplasm of rectum, rectosigmoid junction, and anus: Secondary | ICD-10-CM | POA: Insufficient documentation

## 2018-07-10 DIAGNOSIS — I493 Ventricular premature depolarization: Secondary | ICD-10-CM | POA: Diagnosis not present

## 2018-07-10 DIAGNOSIS — Z8673 Personal history of transient ischemic attack (TIA), and cerebral infarction without residual deficits: Secondary | ICD-10-CM | POA: Diagnosis not present

## 2018-07-10 DIAGNOSIS — Z79899 Other long term (current) drug therapy: Secondary | ICD-10-CM | POA: Insufficient documentation

## 2018-07-10 DIAGNOSIS — I10 Essential (primary) hypertension: Secondary | ICD-10-CM | POA: Diagnosis not present

## 2018-07-10 DIAGNOSIS — R42 Dizziness and giddiness: Secondary | ICD-10-CM | POA: Insufficient documentation

## 2018-07-10 DIAGNOSIS — Z7982 Long term (current) use of aspirin: Secondary | ICD-10-CM | POA: Diagnosis not present

## 2018-07-10 LAB — COMPREHENSIVE METABOLIC PANEL
ALT: 26 U/L (ref 0–44)
AST: 28 U/L (ref 15–41)
Albumin: 4 g/dL (ref 3.5–5.0)
Alkaline Phosphatase: 54 U/L (ref 38–126)
Anion gap: 9 (ref 5–15)
BUN: 13 mg/dL (ref 6–20)
CO2: 27 mmol/L (ref 22–32)
Calcium: 9.4 mg/dL (ref 8.9–10.3)
Chloride: 104 mmol/L (ref 98–111)
Creatinine, Ser: 1.24 mg/dL (ref 0.61–1.24)
GFR calc Af Amer: >60 mL/min (ref >60)
GFR calc non Af Amer: >60 mL/min (ref >60)
Glucose, Bld: 120 mg/dL — ABNORMAL HIGH (ref 70–99)
Potassium: 4.3 mmol/L (ref 3.5–5.1)
Sodium: 140 mmol/L (ref 135–145)
Total Bilirubin: 0.7 mg/dL (ref 0.3–1.2)
Total Protein: 7.7 g/dL (ref 6.5–8.1)

## 2018-07-10 LAB — CBC WITH DIFFERENTIAL/PLATELET
Abs Immature Granulocytes: 0.01 10*3/uL (ref 0.00–0.07)
Basophils Absolute: 0.1 10*3/uL (ref 0.0–0.1)
Basophils Relative: 1 %
Eosinophils Absolute: 0.1 10*3/uL (ref 0.0–0.5)
Eosinophils Relative: 3 %
HCT: 48.9 % (ref 39.0–52.0)
Hemoglobin: 16.1 g/dL (ref 13.0–17.0)
Immature Granulocytes: 0 %
Lymphocytes Relative: 38 %
Lymphs Abs: 1.9 10*3/uL (ref 0.7–4.0)
MCH: 29.7 pg (ref 26.0–34.0)
MCHC: 32.9 g/dL (ref 30.0–36.0)
MCV: 90.1 fL (ref 80.0–100.0)
Monocytes Absolute: 0.5 10*3/uL (ref 0.1–1.0)
Monocytes Relative: 10 %
Neutro Abs: 2.4 10*3/uL (ref 1.7–7.7)
Neutrophils Relative %: 48 %
Platelets: 173 10*3/uL (ref 150–400)
RBC: 5.43 MIL/uL (ref 4.22–5.81)
RDW: 13.3 % (ref 11.5–15.5)
WBC: 5 10*3/uL (ref 4.0–10.5)
nRBC: 0 % (ref 0.0–0.2)

## 2018-07-10 MED ORDER — SODIUM CHLORIDE 0.9 % IV BOLUS
1000.0000 mL | Freq: Once | INTRAVENOUS | Status: AC
  Administered 2018-07-10: 02:00:00 1000 mL via INTRAVENOUS

## 2018-07-10 MED ORDER — SODIUM CHLORIDE 0.9 % IV BOLUS
500.0000 mL | Freq: Once | INTRAVENOUS | Status: AC
  Administered 2018-07-10: 05:00:00 500 mL via INTRAVENOUS

## 2018-07-10 NOTE — ED Notes (Signed)
PT states understanding of care given, follow up care. PT ambulated from ED to car with a steady gait.  

## 2018-07-10 NOTE — ED Triage Notes (Signed)
Pt coming from home by ems. Pt states around 1045 started to feel dizzy while sitting in bed and as if he is going to pass out. Pt is axox4 at this time. Negative for the stroke screen for ems. Does have a pacemaker not sure of what kind. Pt does have hx of strokes.
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
## 2018-07-10 NOTE — Discharge Instructions (Signed)
Your work-up in the emergency department was reassuring, though your assessment did suggest that you were mildly dehydrated.  You were given 1.5 L IV fluids while in the ED tonight.  Continue your daily medications as prescribed and follow-up with your primary care doctor for further evaluation.  You may return to the ED for new or concerning symptoms.

## 2018-07-10 NOTE — ED Notes (Signed)
Pt states that he felt like the world was spinning at this time like he is dizzy.

## 2018-07-30 ENCOUNTER — Ambulatory Visit (INDEPENDENT_AMBULATORY_CARE_PROVIDER_SITE_OTHER): Payer: Medicaid Other | Admitting: *Deleted

## 2018-07-30 DIAGNOSIS — I63411 Cerebral infarction due to embolism of right middle cerebral artery: Secondary | ICD-10-CM | POA: Diagnosis not present

## 2018-07-31 LAB — CUP PACEART REMOTE DEVICE CHECK
Date Time Interrogation Session: 20200622184136
Implantable Pulse Generator Implant Date: 20190625

## 2018-08-01 ENCOUNTER — Inpatient Hospital Stay: Payer: Medicaid Other

## 2018-08-03 ENCOUNTER — Ambulatory Visit: Payer: Medicaid Other | Admitting: Internal Medicine

## 2018-08-08 NOTE — Progress Notes (Signed)
Carelink Summary Report / Loop Recorder 

## 2018-08-24 ENCOUNTER — Telehealth: Payer: Self-pay

## 2018-08-24 NOTE — Telephone Encounter (Signed)
Follow up  ° ° °Pt is returning call  ° ° °Please call back  °

## 2018-08-24 NOTE — Telephone Encounter (Signed)
Left message for patient regarding disconnected monitor.  

## 2018-08-24 NOTE — Telephone Encounter (Signed)
I let the pt know that his monitor has not communicated with [place] for 19 days. I helped the pt send a manual transmission with his home monitor. Transmission received. I told them if his monitor stop communicating again we will give him a call back again. The pt verbalized communication.

## 2018-08-27 NOTE — Telephone Encounter (Signed)
Left message on machine. Pt device re-connected and transmitting. Left CB to main line if patient has any further questions.    Legrand Como [address] [place], [place] 08/27/2018 2:31 PM

## 2018-08-31 ENCOUNTER — Inpatient Hospital Stay: Payer: Medicaid Other | Admitting: Internal Medicine

## 2018-08-31 ENCOUNTER — Inpatient Hospital Stay: Payer: Medicaid Other

## 2018-09-02 LAB — CUP PACEART REMOTE DEVICE CHECK
Date Time Interrogation Session: 20200725193941
Implantable Pulse Generator Implant Date: 20190625

## 2018-09-03 ENCOUNTER — Ambulatory Visit (INDEPENDENT_AMBULATORY_CARE_PROVIDER_SITE_OTHER): Payer: Medicaid Other | Admitting: *Deleted

## 2018-09-03 DIAGNOSIS — I63411 Cerebral infarction due to embolism of right middle cerebral artery: Secondary | ICD-10-CM

## 2018-09-11 ENCOUNTER — Telehealth: Payer: Self-pay

## 2018-09-11 NOTE — Telephone Encounter (Signed)
Left message for patient to advise of disconnected monitor 

## 2018-09-20 NOTE — Progress Notes (Signed)
Carelink Summary Report / Loop Recorder 

## 2018-10-02 ENCOUNTER — Other Ambulatory Visit: Payer: Self-pay

## 2018-10-03 ENCOUNTER — Inpatient Hospital Stay: Payer: Medicaid Other | Admitting: Internal Medicine

## 2018-10-03 ENCOUNTER — Inpatient Hospital Stay: Payer: Medicaid Other

## 2018-10-03 NOTE — Assessment & Plan Note (Deleted)
s/p on neoadjuvant radiation chemotherapy- ypT3ypN1-stage III rectal cancer; negative margins. s/p adjuvant FOLFOX.  Clinically stable.  #Recommend imaging as part of the surveillance program.  # Stroke/aphasia-new.  Stable.  Awaiting loop recorder.   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  # follow up TBD based on CT scan.   # 25 minutes face-to-face with the patient discussing the above plan of care; more than 50% of time spent on prognosis/ natural history; counseling and coordination.

## 2018-10-04 ENCOUNTER — Ambulatory Visit (INDEPENDENT_AMBULATORY_CARE_PROVIDER_SITE_OTHER): Payer: Medicaid Other | Admitting: *Deleted

## 2018-10-04 DIAGNOSIS — I63411 Cerebral infarction due to embolism of right middle cerebral artery: Secondary | ICD-10-CM | POA: Diagnosis not present

## 2018-10-04 LAB — CUP PACEART REMOTE DEVICE CHECK
Date Time Interrogation Session: 20200827173624
Implantable Pulse Generator Implant Date: 20190625

## 2018-10-11 NOTE — Progress Notes (Signed)
Carelink Summary Report / Loop Recorder 

## 2018-10-31 ENCOUNTER — Telehealth: Payer: Self-pay

## 2018-10-31 NOTE — Telephone Encounter (Signed)
Left message for patient to regarding disconnected monitor 

## 2018-11-06 ENCOUNTER — Telehealth: Payer: Self-pay | Admitting: *Deleted

## 2018-11-06 ENCOUNTER — Telehealth: Payer: Self-pay | Admitting: Neurology

## 2018-11-06 ENCOUNTER — Ambulatory Visit (INDEPENDENT_AMBULATORY_CARE_PROVIDER_SITE_OTHER): Payer: Medicaid Other | Admitting: *Deleted

## 2018-11-06 DIAGNOSIS — I63411 Cerebral infarction due to embolism of right middle cerebral artery: Secondary | ICD-10-CM | POA: Diagnosis not present

## 2018-11-06 LAB — CUP PACEART REMOTE DEVICE CHECK
Date Time Interrogation Session: 20200929201824
Implantable Pulse Generator Implant Date: 20190625

## 2018-11-06 NOTE — Telephone Encounter (Signed)
Call attempt for precovid screening. rn unable to leave a vm for the patient.

## 2018-11-06 NOTE — Telephone Encounter (Signed)
LVM for pt that payment is needed before forms can be be completed

## 2018-11-07 ENCOUNTER — Encounter: Payer: Self-pay | Admitting: Internal Medicine

## 2018-11-07 ENCOUNTER — Inpatient Hospital Stay: Payer: Medicaid Other | Attending: Internal Medicine

## 2018-11-07 ENCOUNTER — Other Ambulatory Visit: Payer: Self-pay

## 2018-11-07 ENCOUNTER — Inpatient Hospital Stay (HOSPITAL_BASED_OUTPATIENT_CLINIC_OR_DEPARTMENT_OTHER): Payer: Medicaid Other | Admitting: Internal Medicine

## 2018-11-07 DIAGNOSIS — Z933 Colostomy status: Secondary | ICD-10-CM | POA: Diagnosis not present

## 2018-11-07 DIAGNOSIS — Z9221 Personal history of antineoplastic chemotherapy: Secondary | ICD-10-CM | POA: Diagnosis not present

## 2018-11-07 DIAGNOSIS — Z79899 Other long term (current) drug therapy: Secondary | ICD-10-CM | POA: Diagnosis not present

## 2018-11-07 DIAGNOSIS — Z923 Personal history of irradiation: Secondary | ICD-10-CM | POA: Insufficient documentation

## 2018-11-07 DIAGNOSIS — Z95828 Presence of other vascular implants and grafts: Secondary | ICD-10-CM

## 2018-11-07 DIAGNOSIS — E785 Hyperlipidemia, unspecified: Secondary | ICD-10-CM | POA: Diagnosis not present

## 2018-11-07 DIAGNOSIS — Z800 Family history of malignant neoplasm of digestive organs: Secondary | ICD-10-CM | POA: Insufficient documentation

## 2018-11-07 DIAGNOSIS — Z7982 Long term (current) use of aspirin: Secondary | ICD-10-CM | POA: Insufficient documentation

## 2018-11-07 DIAGNOSIS — C20 Malignant neoplasm of rectum: Secondary | ICD-10-CM | POA: Diagnosis not present

## 2018-11-07 DIAGNOSIS — Z8673 Personal history of transient ischemic attack (TIA), and cerebral infarction without residual deficits: Secondary | ICD-10-CM | POA: Diagnosis not present

## 2018-11-07 DIAGNOSIS — I10 Essential (primary) hypertension: Secondary | ICD-10-CM | POA: Insufficient documentation

## 2018-11-07 DIAGNOSIS — Z85048 Personal history of other malignant neoplasm of rectum, rectosigmoid junction, and anus: Secondary | ICD-10-CM | POA: Diagnosis not present

## 2018-11-07 LAB — COMPREHENSIVE METABOLIC PANEL
ALT: 19 U/L (ref 0–44)
AST: 17 U/L (ref 15–41)
Albumin: 4.3 g/dL (ref 3.5–5.0)
Alkaline Phosphatase: 53 U/L (ref 38–126)
Anion gap: 7 (ref 5–15)
BUN: 24 mg/dL — ABNORMAL HIGH (ref 6–20)
CO2: 24 mmol/L (ref 22–32)
Calcium: 9.1 mg/dL (ref 8.9–10.3)
Chloride: 107 mmol/L (ref 98–111)
Creatinine, Ser: 1.18 mg/dL (ref 0.61–1.24)
GFR calc Af Amer: >60 mL/min (ref >60)
GFR calc non Af Amer: >60 mL/min (ref >60)
Glucose, Bld: 114 mg/dL — ABNORMAL HIGH (ref 70–99)
Potassium: 3.7 mmol/L (ref 3.5–5.1)
Sodium: 138 mmol/L (ref 135–145)
Total Bilirubin: 0.6 mg/dL (ref 0.3–1.2)
Total Protein: 8.1 g/dL (ref 6.5–8.1)

## 2018-11-07 LAB — CBC WITH DIFFERENTIAL/PLATELET
Abs Immature Granulocytes: 0.02 10*3/uL (ref 0.00–0.07)
Basophils Absolute: 0 10*3/uL (ref 0.0–0.1)
Basophils Relative: 1 %
Eosinophils Absolute: 0.1 10*3/uL (ref 0.0–0.5)
Eosinophils Relative: 2 %
HCT: 49.1 % (ref 39.0–52.0)
Hemoglobin: 16.4 g/dL (ref 13.0–17.0)
Immature Granulocytes: 0 %
Lymphocytes Relative: 32 %
Lymphs Abs: 2 10*3/uL (ref 0.7–4.0)
MCH: 29.3 pg (ref 26.0–34.0)
MCHC: 33.4 g/dL (ref 30.0–36.0)
MCV: 87.8 fL (ref 80.0–100.0)
Monocytes Absolute: 0.5 10*3/uL (ref 0.1–1.0)
Monocytes Relative: 9 %
Neutro Abs: 3.4 10*3/uL (ref 1.7–7.7)
Neutrophils Relative %: 56 %
Platelets: 181 10*3/uL (ref 150–400)
RBC: 5.59 MIL/uL (ref 4.22–5.81)
RDW: 13.3 % (ref 11.5–15.5)
WBC: 6.1 10*3/uL (ref 4.0–10.5)
nRBC: 0 % (ref 0.0–0.2)

## 2018-11-07 MED ORDER — SODIUM CHLORIDE 0.9% FLUSH
10.0000 mL | Freq: Once | INTRAVENOUS | Status: AC
  Administered 2018-11-07: 10 mL via INTRAVENOUS
  Filled 2018-11-07: qty 10

## 2018-11-07 MED ORDER — DULOXETINE HCL 30 MG PO CPEP: 30.0000 mg | ORAL_CAPSULE | Freq: Every day | ORAL | 3 refills | Status: DC

## 2018-11-07 MED ORDER — HEPARIN SOD (PORK) LOCK FLUSH 100 UNIT/ML IV SOLN
500.0000 [IU] | Freq: Once | INTRAVENOUS | Status: AC
  Administered 2018-11-07: 14:00:00 500 [IU] via INTRAVENOUS

## 2018-11-07 NOTE — Assessment & Plan Note (Addendum)
s/p on neoadjuvant radiation chemotherapy- ypT3ypN1-stage III rectal cancer; negative margins. s/p adjuvant FOLFOX.  Imaging-October 2019 NED.  Stable.  #Recommend surveillance imaging in approximately 2 weeks; will call with results.  # Stroke/aphasia-stable/blood pressure poorly controlled.  Recommend checking his blood pressures at home/bringing a log to his PCP.  Continue follow-up with PCP.  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  I spoke at length with the patient's family/brother Marcus regarding the patient's clinical status/plan of care.  Family agreement. .  # DISPOSITION: # CT ab/pelvis in 2 weeks- # follow up in 6 months- Md; labs-bcbc/cmp/CEA- Dr.B    
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
## 2018-11-08 ENCOUNTER — Other Ambulatory Visit: Payer: Self-pay | Admitting: *Deleted

## 2018-11-08 LAB — CEA: CEA: 1.1 ng/mL (ref 0.0–4.7)

## 2018-11-09 MED ORDER — AMLODIPINE BESYLATE 5 MG PO TABS: 5.0000 mg | ORAL_TABLET | Freq: Every morning | ORAL | 1 refills | Status: DC

## 2018-11-16 NOTE — Progress Notes (Signed)
Carelink Summary Report / Loop Recorder 

## 2018-11-21 ENCOUNTER — Ambulatory Visit: Payer: Medicaid Other

## 2018-12-05 ENCOUNTER — Telehealth: Payer: Self-pay | Admitting: *Deleted

## 2018-12-05 NOTE — Telephone Encounter (Signed)
Per Nira Conn in scheduling, Incoming call from patient. He would like a handicap sticker.    Pt is blind with h/o stroke in Feb. 2019. Dr. B follows patient for colon cancer every 6 months. He is NOT actively receiving any treatment in our office. Last tx in 2017  Dr. Genelle Gather, Please advise if our office vs PCP should be the ordering provider for the handicap application.

## 2018-12-05 NOTE — Telephone Encounter (Signed)
Please coordinate through PCP as handicap eligibility is not based on his history of colon cancer. Thanks!

## 2018-12-06 NOTE — Telephone Encounter (Signed)
Spoke with patient. Informed patient that he would need to have his pcp or heart/stroke doctor complete these forms. We only see him every 6 months for his routine follow-up. He gave verbal understanding and will reach out to this primary care.

## 2018-12-09 LAB — CUP PACEART REMOTE DEVICE CHECK
Date Time Interrogation Session: 20201101185544
Implantable Pulse Generator Implant Date: 20190625

## 2018-12-09 NOTE — Telephone Encounter (Signed)
L/H- I agree with you that he should get his handicap eligibility from his PCP. Thanks GB

## 2018-12-10 ENCOUNTER — Ambulatory Visit (INDEPENDENT_AMBULATORY_CARE_PROVIDER_SITE_OTHER): Payer: Medicaid Other | Admitting: *Deleted

## 2018-12-10 DIAGNOSIS — I63411 Cerebral infarction due to embolism of right middle cerebral artery: Secondary | ICD-10-CM | POA: Diagnosis not present

## 2018-12-20 ENCOUNTER — Ambulatory Visit: Payer: Medicaid Other

## 2018-12-24 ENCOUNTER — Telehealth: Payer: Self-pay

## 2018-12-24 NOTE — Telephone Encounter (Signed)
Left message for patient regarding disconnected monitor.  

## 2019-01-02 NOTE — Progress Notes (Signed)
Carelink Summary Report / Loop Recorder 

## 2019-01-07 ENCOUNTER — Ambulatory Visit: Payer: Medicaid Other

## 2019-01-11 ENCOUNTER — Ambulatory Visit (INDEPENDENT_AMBULATORY_CARE_PROVIDER_SITE_OTHER): Payer: Medicaid Other | Admitting: *Deleted

## 2019-01-11 DIAGNOSIS — I63411 Cerebral infarction due to embolism of right middle cerebral artery: Secondary | ICD-10-CM

## 2019-01-12 LAB — CUP PACEART REMOTE DEVICE CHECK
Date Time Interrogation Session: 20201204152635
Implantable Pulse Generator Implant Date: 20190625

## 2019-01-25 ENCOUNTER — Ambulatory Visit: Payer: Medicaid Other

## 2019-01-28 ENCOUNTER — Telehealth: Payer: Self-pay | Admitting: Internal Medicine

## 2019-01-28 NOTE — Telephone Encounter (Signed)
I let the pt know that his monitor is up to date as far as I can see. I told him everything looks normal. The pt thanked me for the call.

## 2019-01-28 NOTE — Telephone Encounter (Signed)
Patient returning call in regards to his device.

## 2019-02-13 ENCOUNTER — Ambulatory Visit (INDEPENDENT_AMBULATORY_CARE_PROVIDER_SITE_OTHER): Payer: Medicaid Other | Admitting: *Deleted

## 2019-02-13 DIAGNOSIS — I63411 Cerebral infarction due to embolism of right middle cerebral artery: Secondary | ICD-10-CM | POA: Diagnosis not present

## 2019-02-13 LAB — CUP PACEART REMOTE DEVICE CHECK
Date Time Interrogation Session: 20210106152930
Implantable Pulse Generator Implant Date: 20190625

## 2019-02-18 ENCOUNTER — Telehealth: Payer: Self-pay | Admitting: Internal Medicine

## 2019-02-18 NOTE — Telephone Encounter (Signed)
Patient unable to trouble shoot monitor. 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 Patient's friend will be with him tomorrow morning to assist with monitor and he will call the office back at that.

## 2019-02-18 NOTE — Telephone Encounter (Signed)
LMOVM for pt to call my direct office number. °

## 2019-02-18 NOTE — Telephone Encounter (Signed)
Patient is returning phone call regarding device stating he was supposed to be getting a callback and still hasn't heard anything.

## 2019-02-19 NOTE — Telephone Encounter (Signed)
The pt and [name] [name] tried to send a transmission but was unsuccessful. I called [employer] and conference the pt into the call. [employer] tried to trouble shoot the monitor. They was unsuccessful. They are sending him a new monitor. The monitor will be delivered in 7-10 business days.

## 2019-02-21 ENCOUNTER — Ambulatory Visit: Admission: RE | Admit: 2019-02-21 | Payer: Medicaid Other | Source: Ambulatory Visit

## 2019-03-29 ENCOUNTER — Ambulatory Visit: Admission: RE | Admit: 2019-03-29 | Payer: Medicaid Other | Source: Ambulatory Visit

## 2019-04-18 ENCOUNTER — Ambulatory Visit (INDEPENDENT_AMBULATORY_CARE_PROVIDER_SITE_OTHER): Payer: Medicaid Other | Admitting: *Deleted

## 2019-04-18 DIAGNOSIS — I63411 Cerebral infarction due to embolism of right middle cerebral artery: Secondary | ICD-10-CM | POA: Diagnosis not present

## 2019-04-18 LAB — CUP PACEART REMOTE DEVICE CHECK
Date Time Interrogation Session: 20210311002244
Implantable Pulse Generator Implant Date: 20190625

## 2019-04-18 NOTE — Progress Notes (Signed)
ILR Remote 

## 2019-04-22 ENCOUNTER — Telehealth: Payer: Self-pay

## 2019-04-22 NOTE — Telephone Encounter (Signed)
Unable to leave a message for patient regarding disconnected monitor

## 2019-05-08 ENCOUNTER — Inpatient Hospital Stay: Payer: Medicaid Other

## 2019-05-08 ENCOUNTER — Ambulatory Visit: Payer: Medicaid Other

## 2019-05-08 ENCOUNTER — Inpatient Hospital Stay: Payer: Medicaid Other | Admitting: Internal Medicine

## 2019-05-08 ENCOUNTER — Telehealth: Payer: Self-pay | Admitting: Internal Medicine

## 2019-05-08 NOTE — Telephone Encounter (Signed)
Patient phoned on this date stating that he had the flu and needed to reschedule. Patient also stated that his mother had COVID and that he had been around her and would be going today to be tested. Appts were rescheduled for 05-23-19. Writer asked patient to phone and inform if his test was positive and needed to push the appt out further due to quarantine. Patient voiced he would do this.

## 2019-05-20 ENCOUNTER — Ambulatory Visit (INDEPENDENT_AMBULATORY_CARE_PROVIDER_SITE_OTHER): Payer: Medicaid Other | Admitting: *Deleted

## 2019-05-20 DIAGNOSIS — I63411 Cerebral infarction due to embolism of right middle cerebral artery: Secondary | ICD-10-CM

## 2019-05-20 LAB — CUP PACEART REMOTE DEVICE CHECK
Date Time Interrogation Session: 20210411033337
Implantable Pulse Generator Implant Date: 20190625

## 2019-05-21 NOTE — Progress Notes (Signed)
ILR Remote 

## 2019-05-22 NOTE — Progress Notes (Unsigned)
RN called and spoke with pt and uncle.  Caregiver stated that pt did not have any symptoms of covid, just some dizziness at times.  CG also stated that pt had first covid vaccine injection.  Pt stated he would be in clinic for appts tomorrow.

## 2019-05-23 ENCOUNTER — Inpatient Hospital Stay: Payer: Medicaid Other | Admitting: Internal Medicine

## 2019-05-23 ENCOUNTER — Inpatient Hospital Stay: Payer: Medicaid Other

## 2019-05-23 NOTE — Assessment & Plan Note (Signed)
s/p on neoadjuvant radiation chemotherapy- ypT3ypN1-stage III rectal cancer; negative margins. s/p adjuvant FOLFOX.  Imaging-October 2019 NED.  Stable.  #Recommend surveillance imaging in approximately 2 weeks; will call with results.  # Stroke/aphasia-stable/blood pressure poorly controlled.  Recommend checking his blood pressures at home/bringing a log to his PCP.  Continue follow-up with PCP.  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  I spoke at length with the patient's family/brother Beverely Low regarding the patient's clinical status/plan of care.  Family agreement. .  # DISPOSITION: # CT ab/pelvis in 2 weeks- # follow up in 6 months- Md; labs-bcbc/cmp/CEA- Dr.B
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
## 2019-05-24 ENCOUNTER — Inpatient Hospital Stay: Payer: Medicaid Other | Admitting: Internal Medicine

## 2019-05-24 ENCOUNTER — Inpatient Hospital Stay: Payer: Medicaid Other

## 2019-05-28 ENCOUNTER — Other Ambulatory Visit: Payer: Self-pay

## 2019-05-28 ENCOUNTER — Inpatient Hospital Stay: Payer: Medicaid Other | Attending: Internal Medicine

## 2019-05-28 ENCOUNTER — Telehealth: Payer: Self-pay | Admitting: *Deleted

## 2019-05-28 ENCOUNTER — Inpatient Hospital Stay (HOSPITAL_BASED_OUTPATIENT_CLINIC_OR_DEPARTMENT_OTHER): Payer: Medicaid Other | Admitting: Internal Medicine

## 2019-05-28 DIAGNOSIS — Z85038 Personal history of other malignant neoplasm of large intestine: Secondary | ICD-10-CM | POA: Insufficient documentation

## 2019-05-28 DIAGNOSIS — E785 Hyperlipidemia, unspecified: Secondary | ICD-10-CM | POA: Insufficient documentation

## 2019-05-28 DIAGNOSIS — Z933 Colostomy status: Secondary | ICD-10-CM | POA: Diagnosis not present

## 2019-05-28 DIAGNOSIS — I10 Essential (primary) hypertension: Secondary | ICD-10-CM | POA: Insufficient documentation

## 2019-05-28 DIAGNOSIS — Z8673 Personal history of transient ischemic attack (TIA), and cerebral infarction without residual deficits: Secondary | ICD-10-CM | POA: Insufficient documentation

## 2019-05-28 DIAGNOSIS — C20 Malignant neoplasm of rectum: Secondary | ICD-10-CM | POA: Insufficient documentation

## 2019-05-28 LAB — CBC WITH DIFFERENTIAL/PLATELET
Abs Immature Granulocytes: 0.01 10*3/uL (ref 0.00–0.07)
Basophils Absolute: 0.1 10*3/uL (ref 0.0–0.1)
Basophils Relative: 1 %
Eosinophils Absolute: 0.1 10*3/uL (ref 0.0–0.5)
Eosinophils Relative: 2 %
HCT: 51.8 % (ref 39.0–52.0)
Hemoglobin: 17.3 g/dL — ABNORMAL HIGH (ref 13.0–17.0)
Immature Granulocytes: 0 %
Lymphocytes Relative: 29 %
Lymphs Abs: 1.7 10*3/uL (ref 0.7–4.0)
MCH: 29.5 pg (ref 26.0–34.0)
MCHC: 33.4 g/dL (ref 30.0–36.0)
MCV: 88.2 fL (ref 80.0–100.0)
Monocytes Absolute: 0.5 10*3/uL (ref 0.1–1.0)
Monocytes Relative: 8 %
Neutro Abs: 3.5 10*3/uL (ref 1.7–7.7)
Neutrophils Relative %: 60 %
Platelets: 183 10*3/uL (ref 150–400)
RBC: 5.87 MIL/uL — ABNORMAL HIGH (ref 4.22–5.81)
RDW: 13.7 % (ref 11.5–15.5)
WBC: 5.9 10*3/uL (ref 4.0–10.5)
nRBC: 0 % (ref 0.0–0.2)

## 2019-05-28 LAB — COMPREHENSIVE METABOLIC PANEL
ALT: 27 U/L (ref 0–44)
AST: 20 U/L (ref 15–41)
Albumin: 4.5 g/dL (ref 3.5–5.0)
Alkaline Phosphatase: 60 U/L (ref 38–126)
Anion gap: 10 (ref 5–15)
BUN: 21 mg/dL — ABNORMAL HIGH (ref 6–20)
CO2: 27 mmol/L (ref 22–32)
Calcium: 9.4 mg/dL (ref 8.9–10.3)
Chloride: 105 mmol/L (ref 98–111)
Creatinine, Ser: 1.21 mg/dL (ref 0.61–1.24)
GFR calc Af Amer: >60 mL/min (ref >60)
GFR calc non Af Amer: >60 mL/min (ref >60)
Glucose, Bld: 92 mg/dL (ref 70–99)
Potassium: 4.1 mmol/L (ref 3.5–5.1)
Sodium: 142 mmol/L (ref 135–145)
Total Bilirubin: 0.9 mg/dL (ref 0.3–1.2)
Total Protein: 8.3 g/dL — ABNORMAL HIGH (ref 6.5–8.1)

## 2019-05-28 MED ORDER — METOPROLOL TARTRATE 25 MG PO TABS: 12.5000 mg | ORAL_TABLET | Freq: Every day | ORAL | 1 refills | Status: AC

## 2019-05-28 MED ORDER — ATORVASTATIN CALCIUM 80 MG PO TABS: 80.0000 mg | ORAL_TABLET | Freq: Every day | ORAL | 0 refills | Status: DC

## 2019-05-28 MED ORDER — ASPIRIN EC 81 MG PO TBEC: 81.0000 mg | DELAYED_RELEASE_TABLET | Freq: Every day | ORAL | 3 refills | Status: AC

## 2019-05-28 MED ORDER — AMLODIPINE BESYLATE 5 MG PO TABS: 5.0000 mg | ORAL_TABLET | Freq: Every morning | ORAL | 1 refills | Status: DC

## 2019-05-28 NOTE — Progress Notes (Signed)
Pt and brother in for follow up, pt reports has not had any BP meds and other meds for "awhile" as well as no potassium, or lipitor.  Pt also states cymbalta makes him itch and tongue tingles.  Pt also request a handicap parking pass.

## 2019-05-28 NOTE — Assessment & Plan Note (Addendum)
s/p on neoadjuvant radiation chemotherapy- ypT3ypN1-stage III rectal cancer; negative margins. s/p adjuvant FOLFOX.  Imaging-October 2019 NED. STABLE.   #With regards to surveillance imaging; declined by insurance as per the letter brought in by patient.  Discussed with Ronnell Freshwater like him to patient's insurance denial.   # Stroke/aphasia-stable/blood pressure poorly controlled.  Refill patient's medications.  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   I spoke at length with the patient's family/brother Beverely Low regarding the patient's clinical status/plan of care.  Family agreement.   # DISPOSITION: # Follow up TBD- Dr.B [awaiting CT approval by insurance.]

## 2019-05-28 NOTE — Telephone Encounter (Signed)
Patient called twice this afternoon re: the medications Dr. Rogue Bussing prescribed. He spoke to [place], Therapist, sports both times to clarify the names of the prescriptions Dr. B sent to the pharmacy. I informed him that Dr. Rogue Bussing renewed his blood pressure medications- amlodipine and metoprolol and Lipitor. Reiterated to the patient that He must obtain an apt with his pcp for future routine medications. On both phone conversations, He requested "pain medicaions and medications for numbness." I explained to the patient that Dr. Jacinto Reap did not prescribe any narcotics.

## 2019-05-29 LAB — CEA: CEA: 0.9 ng/mL (ref 0.0–4.7)
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
## 2019-06-12 ENCOUNTER — Emergency Department: Payer: Medicaid Other

## 2019-06-12 ENCOUNTER — Encounter: Payer: Self-pay | Admitting: Emergency Medicine

## 2019-06-12 ENCOUNTER — Emergency Department
Admission: EM | Admit: 2019-06-12 | Discharge: 2019-06-12 | Disposition: A | Discharge status: Home / Self Care | Payer: Medicaid Other | Attending: Emergency Medicine | Admitting: Emergency Medicine

## 2019-06-12 ENCOUNTER — Other Ambulatory Visit: Payer: Self-pay

## 2019-06-12 DIAGNOSIS — Z5321 Procedure and treatment not carried out due to patient leaving prior to being seen by health care provider: Secondary | ICD-10-CM | POA: Insufficient documentation

## 2019-06-12 DIAGNOSIS — R079 Chest pain, unspecified: Secondary | ICD-10-CM | POA: Insufficient documentation

## 2019-06-12 LAB — BASIC METABOLIC PANEL
Anion gap: 10 (ref 5–15)
BUN: 15 mg/dL (ref 6–20)
CO2: 22 mmol/L (ref 22–32)
Calcium: 8.9 mg/dL (ref 8.9–10.3)
Chloride: 105 mmol/L (ref 98–111)
Creatinine, Ser: 1.18 mg/dL (ref 0.61–1.24)
GFR calc Af Amer: >60 mL/min (ref >60)
GFR calc non Af Amer: >60 mL/min (ref >60)
Glucose, Bld: 138 mg/dL — ABNORMAL HIGH (ref 70–99)
Potassium: 3 mmol/L — ABNORMAL LOW (ref 3.5–5.1)
Sodium: 137 mmol/L (ref 135–145)

## 2019-06-12 LAB — CBC
HCT: 48.4 % (ref 39.0–52.0)
Hemoglobin: 16.5 g/dL (ref 13.0–17.0)
MCH: 29.6 pg (ref 26.0–34.0)
MCHC: 34.1 g/dL (ref 30.0–36.0)
MCV: 86.7 fL (ref 80.0–100.0)
Platelets: 210 10*3/uL (ref 150–400)
RBC: 5.58 MIL/uL (ref 4.22–5.81)
RDW: 13.4 % (ref 11.5–15.5)
WBC: 4.9 10*3/uL (ref 4.0–10.5)
nRBC: 0 % (ref 0.0–0.2)

## 2019-06-12 LAB — TROPONIN I (HIGH SENSITIVITY): Troponin I (High Sensitivity): 5 ng/L (ref <18)
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
## 2019-06-12 NOTE — ED Notes (Signed)
Pt returns from radiology states he is feeling fine now and that his pain was from a fall and he wants to leave.  Pt encouraged to remain and be seen by MD, but states we wants to leave.  Explained to pt the dangers of leaving if this is his heart.  Pt states he understands, calling for a ride at this time.

## 2019-06-12 NOTE — ED Notes (Signed)
Pt seen leaving with family. 

## 2019-06-20 LAB — CUP PACEART REMOTE DEVICE CHECK
Date Time Interrogation Session: 20210512032946
Implantable Pulse Generator Implant Date: 20190625

## 2019-06-24 ENCOUNTER — Ambulatory Visit (INDEPENDENT_AMBULATORY_CARE_PROVIDER_SITE_OTHER): Payer: Medicaid Other | Admitting: *Deleted

## 2019-06-24 DIAGNOSIS — I63411 Cerebral infarction due to embolism of right middle cerebral artery: Secondary | ICD-10-CM

## 2019-06-25 NOTE — Progress Notes (Signed)
Carelink Summary Report / Loop Recorder 

## 2019-07-01 ENCOUNTER — Telehealth: Payer: Self-pay

## 2019-07-01 NOTE — Telephone Encounter (Signed)
Spoke with patient's brother Beverely Low to inform of patient's disconnected monitor.

## 2019-07-26 ENCOUNTER — Telehealth: Payer: Self-pay | Admitting: *Deleted

## 2019-07-26 NOTE — Telephone Encounter (Signed)
Carlie with [facility] called inquiring about how many appts the patient has missed in the past 6 months I looked it up and saw that 3 or 4 had been missed, but rescheduled. She then asked about follow up and if patient was supposed to follow up after completing his treatment and if so how often. Patient has no follow up appts and last appointment in April stated follow up TBD. Per Dr Agnes Lawrence note he was awaiting insurance approval for CT to be done then patient would have follow up after CT. But no CT has been scheduled and still no follow up. Please advise

## 2019-07-26 NOTE — Telephone Encounter (Signed)
RN Will review chart with MD and with the prior auth team to determine next steps.

## 2019-07-29 ENCOUNTER — Ambulatory Visit (INDEPENDENT_AMBULATORY_CARE_PROVIDER_SITE_OTHER): Payer: Medicaid Other | Admitting: *Deleted

## 2019-07-29 DIAGNOSIS — I63411 Cerebral infarction due to embolism of right middle cerebral artery: Secondary | ICD-10-CM | POA: Diagnosis not present

## 2019-07-29 LAB — CUP PACEART REMOTE DEVICE CHECK
Date Time Interrogation Session: 20210620235843
Implantable Pulse Generator Implant Date: 20190625

## 2019-07-29 NOTE — Telephone Encounter (Signed)
We will need to do another PA for the scans.  Thanks GB

## 2019-07-29 NOTE — Telephone Encounter (Signed)
Dr. Jacinto Reap - please advise on follow-up. Per PA team, the Ct scan was not previously approved. Do you want to attempt another PA and r/s the CT scan and follow-ups?

## 2019-07-30 NOTE — Progress Notes (Signed)
Carelink Summary Report / Loop Recorder 

## 2019-08-01 NOTE — Telephone Encounter (Signed)
Please schedule patient for CT scan and follow-up apts with Dr. Rogue Bussing once ct is approved by insurance.

## 2019-08-08 NOTE — Telephone Encounter (Signed)
[name] [name], see if pt's brother [name] [name] could speak to you re: the need for additional appointments.

## 2019-08-13 NOTE — Telephone Encounter (Signed)
[name] [name], any updates?

## 2019-08-13 NOTE — Telephone Encounter (Signed)
[name] [name] has made multiple attempts to reach patient/ patients brother regarding apts. She left message again for brother today that she will mail this apt to the patient/pt's brother.

## 2019-09-02 ENCOUNTER — Telehealth: Payer: Self-pay | Admitting: *Deleted

## 2019-09-02 NOTE — Telephone Encounter (Signed)
Patient called and spoke with Colette in scheduling to cancel his future apts with Dr. Rogue Bussing. Colette made the Dr. B and myself aware that patient wanted to cancel his apt again. Dr. Rogue Bussing asked me to call the patient's brother. When I called [name] [name], (patient's brother). He placed me on a 3 -way call with pt's Uncle [name] [name]. [name] [name] brings his brother to the appointment. Per [name] [name], "pt has been very depressed. His mind is in a different state right now. Some days he has good days and some days he has bad days. If he finds out if he has cancer, then he refuses to do things. [name] [name] and I will talk to [name] [name] and see what he wants to do. [name] [name] believes that if has a scan then his cancer will come back."  [name] [name] asked if the scans could be post poned by at least a week. He will talk to his brother to see if he can get him to agree to come to the test. Dr. Rogue Bussing aware of the conversation above.

## 2019-09-03 ENCOUNTER — Ambulatory Visit: Admission: RE | Admit: 2019-09-03 | Payer: Medicaid Other | Source: Ambulatory Visit

## 2019-09-03 ENCOUNTER — Telehealth: Payer: Self-pay | Admitting: Internal Medicine

## 2019-09-03 NOTE — Telephone Encounter (Signed)
Keep appt with me as planned. Thx GB

## 2019-09-03 NOTE — Telephone Encounter (Signed)
Dr. Jacinto Reap - I reviewed the patient's chart. He has canceled the CT scan and r/s for end of September. Do you still want pt to keep the appointments tomorrow with you.

## 2019-09-03 NOTE — Telephone Encounter (Signed)
On 7/26-spoke to patient regarding his concerns appointments/imaging.  For now patient agrees to keep appointment as planned.  Please do not make any changes.

## 2019-09-04 ENCOUNTER — Inpatient Hospital Stay: Payer: Medicaid Other | Admitting: Internal Medicine

## 2019-09-04 ENCOUNTER — Telehealth: Payer: Self-pay | Admitting: Internal Medicine

## 2019-09-04 NOTE — Telephone Encounter (Signed)
Pt called regarding appt and resch of CT scan.

## 2019-09-09 ENCOUNTER — Other Ambulatory Visit: Payer: Self-pay | Admitting: Internal Medicine

## 2019-09-30 ENCOUNTER — Telehealth: Payer: Self-pay | Admitting: Internal Medicine

## 2019-09-30 NOTE — Telephone Encounter (Signed)
Patient phoned on this date and left voicemail with new phone number: [phone]. Message was sent to clinical team.

## 2019-10-01 ENCOUNTER — Other Ambulatory Visit: Payer: Self-pay | Admitting: *Deleted

## 2019-10-01 NOTE — Telephone Encounter (Signed)
Does patient need an apt prior to RF?

## 2019-10-02 MED ORDER — AMLODIPINE BESYLATE 5 MG PO TABS: 5.0000 mg | ORAL_TABLET | Freq: Every morning | ORAL | 1 refills | Status: DC

## 2019-10-29 ENCOUNTER — Other Ambulatory Visit: Payer: Medicaid Other

## 2019-10-29 ENCOUNTER — Ambulatory Visit: Admission: RE | Admit: 2019-10-29 | Payer: Medicaid Other | Source: Ambulatory Visit

## 2019-11-01 ENCOUNTER — Other Ambulatory Visit: Payer: Self-pay

## 2019-11-01 ENCOUNTER — Inpatient Hospital Stay: Payer: Medicaid Other | Attending: Internal Medicine | Admitting: Internal Medicine

## 2019-11-01 ENCOUNTER — Encounter: Payer: Self-pay | Admitting: Internal Medicine

## 2019-11-01 VITALS — BP 134/91 | HR 77 | Temp 98.4°F | Resp 16 | Ht 77.0 in | Wt 243.0 lb

## 2019-11-01 DIAGNOSIS — G479 Sleep disorder, unspecified: Secondary | ICD-10-CM | POA: Insufficient documentation

## 2019-11-01 DIAGNOSIS — R197 Diarrhea, unspecified: Secondary | ICD-10-CM | POA: Diagnosis not present

## 2019-11-01 DIAGNOSIS — Z923 Personal history of irradiation: Secondary | ICD-10-CM | POA: Insufficient documentation

## 2019-11-01 DIAGNOSIS — C20 Malignant neoplasm of rectum: Secondary | ICD-10-CM | POA: Insufficient documentation

## 2019-11-01 DIAGNOSIS — H9190 Unspecified hearing loss, unspecified ear: Secondary | ICD-10-CM | POA: Diagnosis not present

## 2019-11-01 DIAGNOSIS — Z9221 Personal history of antineoplastic chemotherapy: Secondary | ICD-10-CM | POA: Diagnosis not present

## 2019-11-01 DIAGNOSIS — E785 Hyperlipidemia, unspecified: Secondary | ICD-10-CM | POA: Insufficient documentation

## 2019-11-01 DIAGNOSIS — Z8673 Personal history of transient ischemic attack (TIA), and cerebral infarction without residual deficits: Secondary | ICD-10-CM | POA: Insufficient documentation

## 2019-11-01 DIAGNOSIS — F419 Anxiety disorder, unspecified: Secondary | ICD-10-CM | POA: Diagnosis not present

## 2019-11-01 DIAGNOSIS — R4701 Aphasia: Secondary | ICD-10-CM | POA: Diagnosis not present

## 2019-11-01 DIAGNOSIS — I10 Essential (primary) hypertension: Secondary | ICD-10-CM | POA: Insufficient documentation

## 2019-11-01 DIAGNOSIS — R0789 Other chest pain: Secondary | ICD-10-CM | POA: Diagnosis not present

## 2019-11-01 DIAGNOSIS — Z79899 Other long term (current) drug therapy: Secondary | ICD-10-CM | POA: Diagnosis not present

## 2019-11-01 DIAGNOSIS — Z7982 Long term (current) use of aspirin: Secondary | ICD-10-CM | POA: Diagnosis not present

## 2019-11-01 MED ORDER — PREGABALIN 75 MG PO CAPS
75.0000 mg | ORAL_CAPSULE | Freq: Every day | ORAL | 3 refills | Status: DC
Start: 2019-11-01 — End: 2023-02-17

## 2019-11-01 NOTE — Assessment & Plan Note (Addendum)
s/p on neoadjuvant radiation chemotherapy- ypT3ypN1-stage III rectal cancer; negative margins. s/p adjuvant FOLFOX.  Imaging-October 2019 NED. STABLE. Awaiting CT C/A/P next week.  #Atypical chest pain-likely secondary to anxiety-none at this time.  However this needs to be further worked up-given his history of stroke/poorly controlled blood pressure.  Recommend contacting PCP for further work-up.    # Anxiety/difficulty sleeping-recommend melatonin over-the-counter.  Recommend follow-up with PCP.   # "Diarrhea"-secondary to bowel surgery; recommend follow-up with surgery at [facility].  Contact information given.  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 START pt on lyrica 75 mg qhs.    #Hearing loss-refer to [facility].  # DISPOSITION: move CT appt to October 1st.  # referral to [place] [facility] re: hearing loss # Follow up in 6 months-MD; labs; cbc/cmp/cea- Dr.B  Cc; Dr.Tejan sie.

## 2019-11-01 NOTE — Patient Instructions (Signed)
[facility]  [place], [place] [address]  [place], [place]  [address]  [id number] [place]  [place], [place] [address]  [phone]
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
## 2019-11-07 ENCOUNTER — Other Ambulatory Visit: Payer: Medicaid Other

## 2019-11-11 ENCOUNTER — Ambulatory Visit (INDEPENDENT_AMBULATORY_CARE_PROVIDER_SITE_OTHER): Payer: Medicaid Other

## 2019-11-11 DIAGNOSIS — I63411 Cerebral infarction due to embolism of right middle cerebral artery: Secondary | ICD-10-CM | POA: Diagnosis not present

## 2019-11-11 LAB — CUP PACEART REMOTE DEVICE CHECK
Date Time Interrogation Session: 20210925234359
Implantable Pulse Generator Implant Date: 20190625

## 2019-11-13 NOTE — Progress Notes (Signed)
Carelink Summary Report / Loop Recorder 

## 2019-11-15 ENCOUNTER — Ambulatory Visit: Admission: RE | Admit: 2019-11-15 | Payer: Medicaid Other | Source: Ambulatory Visit

## 2019-12-02 ENCOUNTER — Other Ambulatory Visit: Payer: Self-pay | Admitting: Internal Medicine

## 2019-12-04 ENCOUNTER — Ambulatory Visit (INDEPENDENT_AMBULATORY_CARE_PROVIDER_SITE_OTHER): Payer: Medicaid Other

## 2019-12-04 DIAGNOSIS — I63411 Cerebral infarction due to embolism of right middle cerebral artery: Secondary | ICD-10-CM

## 2019-12-04 LAB — CUP PACEART REMOTE DEVICE CHECK
Date Time Interrogation Session: 20211026234801
Implantable Pulse Generator Implant Date: 20190625

## 2019-12-09 NOTE — Progress Notes (Signed)
Carelink Summary Report / Loop Recorder 

## 2019-12-13 NOTE — Addendum Note (Signed)
Addended by: Douglass Rivers D on: 12/13/2019 10:02 AM   Modules accepted: Level of Service

## 2020-01-06 ENCOUNTER — Ambulatory Visit: Payer: Medicaid Other

## 2020-01-09 ENCOUNTER — Telehealth: Payer: Self-pay

## 2020-01-09 NOTE — Telephone Encounter (Signed)
ILR alert received 01/09/20 for event report, no details available. Calling patient to request manual transmission.  Attempted to call patient. No answer, unable to leave VM.

## 2020-01-15 NOTE — Telephone Encounter (Signed)
No answer no voice mail  

## 2020-01-20 NOTE — Telephone Encounter (Signed)
No answer no voice mail  

## 2020-01-27 NOTE — Telephone Encounter (Signed)
Certified letter sent 

## 2020-02-05 LAB — CUP PACEART REMOTE DEVICE CHECK
Date Time Interrogation Session: 20211227225034
Implantable Pulse Generator Implant Date: 20190625

## 2020-02-10 ENCOUNTER — Ambulatory Visit: Payer: Medicaid Other

## 2020-02-10 DIAGNOSIS — I63411 Cerebral infarction due to embolism of right middle cerebral artery: Secondary | ICD-10-CM

## 2020-02-25 NOTE — Progress Notes (Signed)
Carelink Summary Report / Loop Recorder 

## 2020-03-09 LAB — CUP PACEART REMOTE DEVICE CHECK
Date Time Interrogation Session: 20220129225352
Implantable Pulse Generator Implant Date: 20190625

## 2020-03-11 ENCOUNTER — Telehealth: Payer: Self-pay

## 2020-03-11 NOTE — Telephone Encounter (Signed)
Patient called in and needs to send a remote transmission but he is blind and cannot do it on his own and asks for someone to call him back 03/12/2020 @ 12pm when someone will be present then. I let patient know someone will call him [phone]

## 2020-03-12 NOTE — Telephone Encounter (Signed)
Called patient to help send remote transmission and I left a VM for patient to call DC back

## 2020-03-13 ENCOUNTER — Ambulatory Visit (INDEPENDENT_AMBULATORY_CARE_PROVIDER_SITE_OTHER): Payer: Medicaid Other

## 2020-03-13 DIAGNOSIS — I63411 Cerebral infarction due to embolism of right middle cerebral artery: Secondary | ICD-10-CM

## 2020-03-17 NOTE — Progress Notes (Signed)
Carelink Summary Report / Loop Recorder 

## 2020-03-18 NOTE — Telephone Encounter (Signed)
LMOM to call DC, # provided.

## 2020-03-24 NOTE — Telephone Encounter (Signed)
LMOVM for pt to call us to get help with monitor.

## 2020-03-25 ENCOUNTER — Ambulatory Visit: Admission: RE | Admit: 2020-03-25 | Payer: Medicaid Other | Source: Ambulatory Visit

## 2020-03-31 NOTE — Telephone Encounter (Signed)
Certified letter sent 

## 2020-04-07 ENCOUNTER — Telehealth: Payer: Self-pay

## 2020-04-07 NOTE — Telephone Encounter (Signed)
The pt called to let us know he received our certified letter.Marland Kitchen His brother helped him send a manual transmission with his monitor. I told him the nurse will review it and give him a call back. [phone].

## 2020-04-07 NOTE — Telephone Encounter (Signed)
LVM for pt advising transmission received, report shows SR with ectopy.  Will continue to monitor.

## 2020-04-13 ENCOUNTER — Ambulatory Visit (INDEPENDENT_AMBULATORY_CARE_PROVIDER_SITE_OTHER): Payer: Medicaid Other

## 2020-04-13 DIAGNOSIS — I63411 Cerebral infarction due to embolism of right middle cerebral artery: Secondary | ICD-10-CM

## 2020-04-15 LAB — CUP PACEART REMOTE DEVICE CHECK
Date Time Interrogation Session: 20220305230655
Implantable Pulse Generator Implant Date: 20190625

## 2020-04-22 NOTE — Progress Notes (Signed)
Carelink Summary Report / Loop Recorder 

## 2020-05-01 ENCOUNTER — Ambulatory Visit: Payer: Medicaid Other | Admitting: Internal Medicine

## 2020-05-01 ENCOUNTER — Other Ambulatory Visit: Payer: Medicaid Other

## 2020-05-14 ENCOUNTER — Ambulatory Visit (INDEPENDENT_AMBULATORY_CARE_PROVIDER_SITE_OTHER): Payer: Medicaid Other

## 2020-05-14 DIAGNOSIS — I63411 Cerebral infarction due to embolism of right middle cerebral artery: Secondary | ICD-10-CM

## 2020-05-15 LAB — CUP PACEART REMOTE DEVICE CHECK
Date Time Interrogation Session: 20220408000849
Implantable Pulse Generator Implant Date: 20190625

## 2020-05-21 ENCOUNTER — Ambulatory Visit: Payer: Medicaid Other

## 2020-05-25 ENCOUNTER — Other Ambulatory Visit: Payer: Medicaid Other

## 2020-05-25 ENCOUNTER — Ambulatory Visit: Payer: Medicaid Other | Admitting: Internal Medicine

## 2020-05-27 NOTE — Progress Notes (Signed)
Carelink Summary Report / Loop Recorder 

## 2020-06-15 ENCOUNTER — Ambulatory Visit (INDEPENDENT_AMBULATORY_CARE_PROVIDER_SITE_OTHER): Payer: Medicaid Other

## 2020-06-15 DIAGNOSIS — I63411 Cerebral infarction due to embolism of right middle cerebral artery: Secondary | ICD-10-CM | POA: Diagnosis not present

## 2020-06-17 LAB — CUP PACEART REMOTE DEVICE CHECK
Date Time Interrogation Session: 20220511001726
Implantable Pulse Generator Implant Date: 20190625

## 2020-06-23 ENCOUNTER — Telehealth: Payer: Self-pay | Admitting: *Deleted

## 2020-06-23 ENCOUNTER — Ambulatory Visit: Payer: Medicaid Other

## 2020-06-23 NOTE — Telephone Encounter (Signed)
Patient called and requested to change/move his future apts for ct scan. Colette noticed that chest ct scan was not ordered. Although Dr. Rogue Bussing mentioned this in his last note. Dr. B would you clarify if chest ct is needed and if so, could this be without iv contrast given the international shortage of iv contrast? Please review chart and let Colette know. If chest Ct is needed, please enter an order.  Is prior auth needed for chest abd and pelvis?

## 2020-06-24 ENCOUNTER — Inpatient Hospital Stay: Payer: Medicaid Other

## 2020-06-24 ENCOUNTER — Inpatient Hospital Stay: Payer: Medicaid Other | Admitting: Oncology

## 2020-06-25 NOTE — Telephone Encounter (Signed)
Pt called and per note below advised that I was waiting for Dr. B to come back on Monday for approval of Chest ct as well. Will call patient as soon as this is dicussed with Dr. Jacinto Reap. Pt expressed understanding. Also forwarded to Lauren to have her check into what is needed.

## 2020-07-01 ENCOUNTER — Other Ambulatory Visit: Payer: Self-pay

## 2020-07-01 DIAGNOSIS — C20 Malignant neoplasm of rectum: Secondary | ICD-10-CM

## 2020-07-07 NOTE — Progress Notes (Signed)
Carelink Summary Report / Loop Recorder 

## 2020-07-16 ENCOUNTER — Ambulatory Visit (INDEPENDENT_AMBULATORY_CARE_PROVIDER_SITE_OTHER): Payer: Medicaid Other

## 2020-07-16 DIAGNOSIS — I63411 Cerebral infarction due to embolism of right middle cerebral artery: Secondary | ICD-10-CM

## 2020-07-20 LAB — CUP PACEART REMOTE DEVICE CHECK
Date Time Interrogation Session: 20220613010730
Implantable Pulse Generator Implant Date: 20190625

## 2020-07-24 ENCOUNTER — Ambulatory Visit: Admission: RE | Admit: 2020-07-24 | Payer: Medicaid Other | Source: Ambulatory Visit

## 2020-07-27 ENCOUNTER — Inpatient Hospital Stay: Payer: Medicaid Other | Attending: Internal Medicine

## 2020-07-27 ENCOUNTER — Inpatient Hospital Stay: Payer: Medicaid Other | Admitting: Internal Medicine
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
## 2020-07-27 NOTE — Assessment & Plan Note (Deleted)
s/p on neoadjuvant radiation chemotherapy- ypT3ypN1-stage III rectal cancer; negative margins. s/p adjuvant FOLFOX.  Imaging-October 2019 NED. STABLE. Awaiting CT C/A/P next week.  #Atypical chest pain-likely secondary to anxiety-none at this time.  However this needs to be further worked up-given his history of stroke/poorly controlled blood pressure.  Recommend contacting PCP for further work-up.    # Anxiety/difficulty sleeping-recommend melatonin over-the-counter.  Recommend follow-up with PCP.   # "Diarrhea"-secondary to bowel surgery; recommend follow-up with surgery at [facility].  Contact information given.  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 START pt on lyrica 75 mg qhs.    #Hearing loss-refer to [facility].  # DISPOSITION: move CT appt to October 1st.  # referral to [place] [facility] re: hearing loss # Follow up in 6 months-MD; labs; cbc/cmp/cea- Dr.B  Cc; Dr.Tejan sie.

## 2020-08-07 NOTE — Progress Notes (Signed)
Carelink Summary Report / Loop Recorder 

## 2020-08-17 ENCOUNTER — Ambulatory Visit (INDEPENDENT_AMBULATORY_CARE_PROVIDER_SITE_OTHER): Payer: Medicaid Other

## 2020-08-17 DIAGNOSIS — I63411 Cerebral infarction due to embolism of right middle cerebral artery: Secondary | ICD-10-CM

## 2020-08-26 ENCOUNTER — Other Ambulatory Visit: Payer: Medicaid Other

## 2020-08-26 LAB — CUP PACEART REMOTE DEVICE CHECK
Date Time Interrogation Session: 20220716011715
Implantable Pulse Generator Implant Date: 20190625

## 2020-09-01 ENCOUNTER — Ambulatory Visit: Payer: Medicaid Other

## 2020-09-09 NOTE — Progress Notes (Signed)
Carelink Summary Report / Loop Recorder 

## 2020-09-11 ENCOUNTER — Telehealth: Payer: Self-pay | Admitting: Internal Medicine

## 2020-09-11 NOTE — Telephone Encounter (Signed)
   [name] [name] DOB: 23-Jul-1965 MRN: [id number]   RIDER WAIVER AND RELEASE OF LIABILITY  For purposes of improving physical access to our facilities, [facility] is pleased to partner with third parties to provide [facility] patients or other authorized individuals the option of convenient, on-demand ground transportation services (the Technical brewer") through use of the technology service that enables users to request on-demand ground transportation from independent third-party providers.  By opting to use and accept these [employer], I, the undersigned, hereby agree on behalf of myself, and on behalf of any minor child using the Government social research officer for whom I am the parent or legal guardian, as follows:  Government social research officer provided to me are provided by independent third-party transportation providers who are not [employer] or employees and who are unaffiliated with [employer]. [place] is neither a transportation carrier nor a common or public carrier. [facility] has no control over the quality or safety of the transportation that occurs as a result of the [employer]. [facility] cannot guarantee that any third-party transportation provider will complete any arranged transportation service. [facility] makes no representation, warranty, or guarantee regarding the reliability, timeliness, quality, safety, suitability, or availability of any of the Transport Services or that they will be error free. I fully understand that traveling by vehicle involves risks and dangers of serious bodily injury, including permanent disability, paralysis, and death. I agree, on behalf of myself and on behalf of any minor child using the Transport Services for whom I am the parent or legal guardian, that the entire risk arising out of my use of the [employer] remains solely with me, to the maximum extent permitted under applicable law. The [employer] are provided "as  is" and "as available." [facility] disclaims all representations and warranties, express, implied or statutory, not expressly set out in these terms, including the implied warranties of merchantability and fitness for a particular purpose. I hereby waive and release [facility], its agents, employees, officers, directors, representatives, insurers, attorneys, assigns, successors, subsidiaries, and affiliates from any and all past, present, or future claims, demands, liabilities, actions, causes of action, or suits of any kind directly or indirectly arising from acceptance and use of the [employer]. I further waive and release [facility] and its affiliates from all present and future liability and responsibility for any injury or death to persons or damages to property caused by or related to the use of the [employer]. I have read this Waiver and Release of Liability, and I understand the terms used in it and their legal significance. This Waiver is freely and voluntarily given with the understanding that my right (as well as the right of any minor child for whom I am the parent or legal guardian using the [employer]) to legal recourse against  in connection with the [employer] is knowingly surrendered in return for use of these services.   I attest that I read the consent document to [name] [name], gave Mr. Fluckiger the opportunity to ask questions and answered the questions asked (if any). I affirm that [name] [name] then provided consent for he's participation in this program.     [name] [name]

## 2020-09-15 ENCOUNTER — Encounter: Payer: Medicaid Other | Admitting: Internal Medicine
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
## 2020-09-24 ENCOUNTER — Ambulatory Visit (INDEPENDENT_AMBULATORY_CARE_PROVIDER_SITE_OTHER): Payer: Medicaid Other

## 2020-09-24 DIAGNOSIS — I63411 Cerebral infarction due to embolism of right middle cerebral artery: Secondary | ICD-10-CM

## 2020-09-24 LAB — CUP PACEART REMOTE DEVICE CHECK
Date Time Interrogation Session: 20220818014441
Implantable Pulse Generator Implant Date: 20190625

## 2020-10-06 ENCOUNTER — Ambulatory Visit: Payer: Medicaid Other

## 2020-10-13 ENCOUNTER — Encounter: Payer: Self-pay | Admitting: Internal Medicine

## 2020-10-13 NOTE — Telephone Encounter (Signed)
This encounter was created in error - please disregard.

## 2020-10-14 NOTE — Progress Notes (Signed)
Carelink Summary Report / Loop Recorder 

## 2020-10-20 ENCOUNTER — Encounter: Payer: Medicaid Other | Admitting: Internal Medicine

## 2020-10-27 ENCOUNTER — Ambulatory Visit (INDEPENDENT_AMBULATORY_CARE_PROVIDER_SITE_OTHER): Payer: Medicaid Other

## 2020-10-27 DIAGNOSIS — I63411 Cerebral infarction due to embolism of right middle cerebral artery: Secondary | ICD-10-CM

## 2020-10-27 LAB — CUP PACEART REMOTE DEVICE CHECK
Date Time Interrogation Session: 20220920014559
Implantable Pulse Generator Implant Date: 20190625

## 2020-11-03 NOTE — Progress Notes (Signed)
Carelink Summary Report / Loop Recorder 

## 2020-11-30 ENCOUNTER — Ambulatory Visit (INDEPENDENT_AMBULATORY_CARE_PROVIDER_SITE_OTHER): Payer: Medicaid Other

## 2020-11-30 DIAGNOSIS — I63411 Cerebral infarction due to embolism of right middle cerebral artery: Secondary | ICD-10-CM | POA: Diagnosis not present

## 2020-11-30 LAB — CUP PACEART REMOTE DEVICE CHECK
Date Time Interrogation Session: 20221023014841
Implantable Pulse Generator Implant Date: 20190625

## 2020-12-08 NOTE — Progress Notes (Signed)
Carelink Summary Report / Loop Recorder 

## 2021-01-04 ENCOUNTER — Ambulatory Visit (INDEPENDENT_AMBULATORY_CARE_PROVIDER_SITE_OTHER): Payer: Medicaid Other

## 2021-01-04 DIAGNOSIS — I63411 Cerebral infarction due to embolism of right middle cerebral artery: Secondary | ICD-10-CM

## 2021-01-04 LAB — CUP PACEART REMOTE DEVICE CHECK
Date Time Interrogation Session: 20221125005227
Implantable Pulse Generator Implant Date: 20190625

## 2021-01-13 NOTE — Progress Notes (Signed)
Carelink Summary Report / Loop Recorder 

## 2021-01-19 ENCOUNTER — Encounter: Payer: Medicaid Other | Admitting: Internal Medicine

## 2021-01-19 DIAGNOSIS — Z959 Presence of cardiac and vascular implant and graft, unspecified: Secondary | ICD-10-CM

## 2021-01-19 DIAGNOSIS — I639 Cerebral infarction, unspecified: Secondary | ICD-10-CM

## 2021-02-03 ENCOUNTER — Ambulatory Visit (INDEPENDENT_AMBULATORY_CARE_PROVIDER_SITE_OTHER): Payer: Medicaid Other

## 2021-02-03 DIAGNOSIS — I63411 Cerebral infarction due to embolism of right middle cerebral artery: Secondary | ICD-10-CM | POA: Diagnosis not present

## 2021-02-03 LAB — CUP PACEART REMOTE DEVICE CHECK
Date Time Interrogation Session: 20221228005417
Implantable Pulse Generator Implant Date: 20190625

## 2021-02-16 NOTE — Progress Notes (Signed)
Carelink Summary Report / Loop Recorder 

## 2021-02-19 ENCOUNTER — Encounter: Payer: Self-pay | Admitting: Internal Medicine

## 2021-03-01 ENCOUNTER — Telehealth: Payer: Self-pay

## 2021-03-01 NOTE — Telephone Encounter (Signed)
LMOVM for pt to send transmission with his home remote monitor. I left the device clinic number for the patient to call back.

## 2021-03-09 NOTE — Telephone Encounter (Signed)
LMOVM for patient to send manual transmission.

## 2021-03-15 ENCOUNTER — Ambulatory Visit (INDEPENDENT_AMBULATORY_CARE_PROVIDER_SITE_OTHER): Payer: Medicaid Other

## 2021-03-15 DIAGNOSIS — I63411 Cerebral infarction due to embolism of right middle cerebral artery: Secondary | ICD-10-CM

## 2021-03-16 LAB — CUP PACEART REMOTE DEVICE CHECK
Date Time Interrogation Session: 20230205231054
Date Time Interrogation Session: 20230207000500
Implantable Pulse Generator Implant Date: 20190625
Implantable Pulse Generator Implant Date: 20190625

## 2021-03-18 NOTE — Progress Notes (Signed)
Carelink Summary Report / Loop Recorder 

## 2021-03-19 NOTE — Telephone Encounter (Signed)
No answer/no voicemail. Got busy signal.

## 2021-03-24 NOTE — Telephone Encounter (Signed)
Letter sent 03-24-2021.

## 2021-04-19 ENCOUNTER — Ambulatory Visit (INDEPENDENT_AMBULATORY_CARE_PROVIDER_SITE_OTHER): Payer: Medicaid Other

## 2021-04-19 DIAGNOSIS — I63411 Cerebral infarction due to embolism of right middle cerebral artery: Secondary | ICD-10-CM | POA: Diagnosis not present

## 2021-04-20 LAB — CUP PACEART REMOTE DEVICE CHECK
Date Time Interrogation Session: 20230312232532
Implantable Pulse Generator Implant Date: 20190625

## 2021-05-03 NOTE — Progress Notes (Signed)
Carelink Summary Report / Loop Recorder 

## 2021-05-24 ENCOUNTER — Ambulatory Visit (INDEPENDENT_AMBULATORY_CARE_PROVIDER_SITE_OTHER): Payer: Medicaid Other

## 2021-05-24 DIAGNOSIS — I63411 Cerebral infarction due to embolism of right middle cerebral artery: Secondary | ICD-10-CM

## 2021-05-25 LAB — CUP PACEART REMOTE DEVICE CHECK
Date Time Interrogation Session: 20230414231757
Implantable Pulse Generator Implant Date: 20190625

## 2021-05-31 ENCOUNTER — Telehealth: Payer: Self-pay

## 2021-05-31 NOTE — Telephone Encounter (Signed)
ILR reached [facility] 05/18/2021. ? ?Attempted to contact advised ILR @ [facility]. [place] [address] [place] ~ patients number is not in service. No answer, Unable to leave VM.  ? ?-Marked "I" in [employer]. ?-Taken out of carelink. ?- Canceled apts. In Epic. ?

## 2021-06-02 NOTE — Telephone Encounter (Signed)
Called all number no file, no answer. ?

## 2021-06-02 NOTE — Telephone Encounter (Signed)
Second attempt to contact patient. Phone number no in service. Unable to leave VM. ?

## 2021-06-04 NOTE — Telephone Encounter (Signed)
Letter mailed to patient.

## 2021-06-10 NOTE — Progress Notes (Signed)
Carelink Summary Report / Loop Recorder 

## 2021-06-10 NOTE — Addendum Note (Signed)
Addended by: Cheri Kearns A on: 06/10/2021 10:58 AM ? ? Modules accepted: Level of Service ? ?

## 2022-03-29 ENCOUNTER — Other Ambulatory Visit: Payer: Self-pay | Admitting: Internal Medicine

## 2022-03-30 ENCOUNTER — Telehealth: Payer: Self-pay

## 2022-03-30 NOTE — Telephone Encounter (Signed)
Pt called and left vm requesting a call back didn't say what this was in regards to

## 2022-04-04 ENCOUNTER — Other Ambulatory Visit: Payer: Self-pay | Admitting: Internal Medicine

## 2022-04-05 NOTE — Telephone Encounter (Signed)
Tried calling patient back again as he has left another 2 messages and LM for him to call back rx has been sent for his cholesterol

## 2022-05-09 ENCOUNTER — Other Ambulatory Visit: Payer: Self-pay | Admitting: Internal Medicine

## 2022-07-01 ENCOUNTER — Other Ambulatory Visit: Payer: Self-pay | Admitting: Internal Medicine

## 2022-07-04 ENCOUNTER — Other Ambulatory Visit: Payer: Self-pay | Admitting: Nurse Practitioner

## 2022-07-04 ENCOUNTER — Telehealth: Payer: Self-pay | Admitting: Internal Medicine

## 2022-07-04 MED ORDER — AMLODIPINE BESYLATE 5 MG PO TABS: 5.0000 mg | ORAL_TABLET | Freq: Every morning | ORAL | 0 refills | Status: DC

## 2022-07-04 NOTE — Telephone Encounter (Signed)
Patient needs refills sent for amlodipine and atorvastatin 20 mg.   [facility]  Patient has new phone number  [phone]  Please let me know when it has been sent so I can notify patient.

## 2022-08-03 ENCOUNTER — Telehealth: Payer: Self-pay

## 2022-08-26 NOTE — Telephone Encounter (Signed)
Error

## 2022-09-05 ENCOUNTER — Other Ambulatory Visit: Payer: Self-pay | Admitting: Internal Medicine

## 2022-11-15 ENCOUNTER — Other Ambulatory Visit: Payer: Self-pay | Admitting: Internal Medicine

## 2023-01-13 ENCOUNTER — Encounter: Payer: Self-pay | Admitting: Internal Medicine

## 2023-01-27 ENCOUNTER — Other Ambulatory Visit: Payer: Self-pay | Admitting: Internal Medicine

## 2023-02-17 ENCOUNTER — Encounter: Payer: Self-pay | Admitting: Internal Medicine

## 2023-02-17 ENCOUNTER — Ambulatory Visit: Payer: Medicaid Other | Admitting: Internal Medicine

## 2023-02-17 VITALS — BP 132/86 | HR 75 | Temp 97.3°F | Ht 78.0 in | Wt 234.0 lb

## 2023-02-17 DIAGNOSIS — I10 Essential (primary) hypertension: Secondary | ICD-10-CM | POA: Diagnosis not present

## 2023-02-17 DIAGNOSIS — H548 Legal blindness, as defined in USA: Secondary | ICD-10-CM | POA: Diagnosis not present

## 2023-02-17 DIAGNOSIS — M795 Residual foreign body in soft tissue: Secondary | ICD-10-CM

## 2023-02-17 DIAGNOSIS — I63411 Cerebral infarction due to embolism of right middle cerebral artery: Secondary | ICD-10-CM | POA: Diagnosis not present

## 2023-02-17 DIAGNOSIS — N400 Enlarged prostate without lower urinary tract symptoms: Secondary | ICD-10-CM

## 2023-02-17 DIAGNOSIS — H9193 Unspecified hearing loss, bilateral: Secondary | ICD-10-CM

## 2023-02-17 MED ORDER — AMLODIPINE BESYLATE 5 MG PO TABS: 5.0000 mg | ORAL_TABLET | Freq: Every day | ORAL | 0 refills | Status: DC
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
## 2023-02-18 LAB — PSA: Prostate Specific Ag, Serum: 1.5 ng/mL (ref 0.0–4.0)

## 2023-03-05 ENCOUNTER — Other Ambulatory Visit: Payer: Self-pay | Admitting: Internal Medicine

## 2023-03-05 DIAGNOSIS — I10 Essential (primary) hypertension: Secondary | ICD-10-CM

## 2023-03-06 ENCOUNTER — Other Ambulatory Visit: Payer: Self-pay

## 2023-03-06 MED ORDER — ATORVASTATIN CALCIUM 20 MG PO TABS: 20.0000 mg | ORAL_TABLET | Freq: Every day | ORAL | 0 refills | Status: DC

## 2023-03-13 ENCOUNTER — Ambulatory Visit: Payer: Medicaid Other | Admitting: Internal Medicine

## 2023-04-03 ENCOUNTER — Other Ambulatory Visit: Payer: Self-pay | Admitting: Internal Medicine

## 2023-04-03 DIAGNOSIS — I10 Essential (primary) hypertension: Secondary | ICD-10-CM

## 2023-04-25 ENCOUNTER — Other Ambulatory Visit: Payer: Self-pay

## 2023-04-25 DIAGNOSIS — I10 Essential (primary) hypertension: Secondary | ICD-10-CM

## 2023-04-25 MED ORDER — AMLODIPINE BESYLATE 5 MG PO TABS: 5.0000 mg | ORAL_TABLET | Freq: Every day | ORAL | 0 refills | Status: DC

## 2023-05-10 ENCOUNTER — Other Ambulatory Visit: Payer: Self-pay | Admitting: Internal Medicine

## 2023-05-10 DIAGNOSIS — I10 Essential (primary) hypertension: Secondary | ICD-10-CM

## 2023-06-27 ENCOUNTER — Other Ambulatory Visit: Payer: Self-pay | Admitting: Internal Medicine

## 2023-06-27 DIAGNOSIS — I10 Essential (primary) hypertension: Secondary | ICD-10-CM

## 2023-07-24 ENCOUNTER — Other Ambulatory Visit: Payer: Self-pay | Admitting: Internal Medicine

## 2023-07-24 DIAGNOSIS — I10 Essential (primary) hypertension: Secondary | ICD-10-CM

## 2023-07-25 ENCOUNTER — Other Ambulatory Visit: Payer: Self-pay | Admitting: Internal Medicine

## 2023-07-25 DIAGNOSIS — I10 Essential (primary) hypertension: Secondary | ICD-10-CM

## 2023-08-11 ENCOUNTER — Other Ambulatory Visit: Payer: Self-pay | Admitting: Internal Medicine

## 2023-08-11 DIAGNOSIS — I10 Essential (primary) hypertension: Secondary | ICD-10-CM

## 2023-08-25 ENCOUNTER — Other Ambulatory Visit: Payer: Self-pay | Admitting: Internal Medicine

## 2023-08-25 DIAGNOSIS — I10 Essential (primary) hypertension: Secondary | ICD-10-CM

## 2023-09-11 ENCOUNTER — Other Ambulatory Visit: Payer: Self-pay

## 2023-09-11 DIAGNOSIS — I10 Essential (primary) hypertension: Secondary | ICD-10-CM

## 2023-09-11 MED ORDER — ATORVASTATIN CALCIUM 20 MG PO TABS: 20.0000 mg | ORAL_TABLET | Freq: Every day | ORAL | 0 refills | Status: DC

## 2023-09-11 MED ORDER — AMLODIPINE BESYLATE 5 MG PO TABS: 5.0000 mg | ORAL_TABLET | Freq: Every day | ORAL | 1 refills | Status: DC

## 2023-09-11 NOTE — Telephone Encounter (Signed)
 Pt called requesting refill on rx Atorvastatin , he hasn't been able to move up his appt due to transportation. Please advise

## 2023-10-02 ENCOUNTER — Other Ambulatory Visit: Payer: Self-pay | Admitting: Internal Medicine

## 2023-10-20 ENCOUNTER — Ambulatory Visit: Admitting: Internal Medicine

## 2023-10-20 ENCOUNTER — Encounter: Payer: Self-pay | Admitting: Internal Medicine

## 2023-10-20 VITALS — BP 116/80 | HR 82 | Temp 97.7°F | Ht 78.0 in | Wt 239.6 lb

## 2023-10-20 DIAGNOSIS — Z1331 Encounter for screening for depression: Secondary | ICD-10-CM

## 2023-10-20 DIAGNOSIS — G44229 Chronic tension-type headache, not intractable: Secondary | ICD-10-CM | POA: Diagnosis not present

## 2023-10-20 DIAGNOSIS — Z0001 Encounter for general adult medical examination with abnormal findings: Secondary | ICD-10-CM | POA: Diagnosis not present

## 2023-10-20 DIAGNOSIS — K591 Functional diarrhea: Secondary | ICD-10-CM

## 2023-10-20 DIAGNOSIS — R5381 Other malaise: Secondary | ICD-10-CM

## 2023-10-20 DIAGNOSIS — H9193 Unspecified hearing loss, bilateral: Secondary | ICD-10-CM | POA: Diagnosis not present

## 2023-10-20 DIAGNOSIS — I63411 Cerebral infarction due to embolism of right middle cerebral artery: Secondary | ICD-10-CM

## 2023-10-20 DIAGNOSIS — I10 Essential (primary) hypertension: Secondary | ICD-10-CM

## 2023-10-20 MED ORDER — CHOLESTYRAMINE 4 G PO PACK: 1.0000 | PACK | Freq: Two times a day (BID) | ORAL | 2 refills | Status: AC

## 2023-10-20 MED ORDER — INDOMETHACIN 50 MG PO CAPS: 50.0000 mg | ORAL_CAPSULE | Freq: Three times a day (TID) | ORAL | 1 refills | Status: AC | PRN
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
## 2023-11-13 ENCOUNTER — Other Ambulatory Visit: Payer: Self-pay | Admitting: Internal Medicine

## 2023-11-13 DIAGNOSIS — I10 Essential (primary) hypertension: Secondary | ICD-10-CM

## 2023-11-17 ENCOUNTER — Ambulatory Visit: Admitting: Internal Medicine

## 2023-12-05 ENCOUNTER — Other Ambulatory Visit: Payer: Self-pay | Admitting: Internal Medicine

## 2023-12-11 ENCOUNTER — Other Ambulatory Visit: Payer: Self-pay | Admitting: Internal Medicine

## 2023-12-11 DIAGNOSIS — I10 Essential (primary) hypertension: Secondary | ICD-10-CM

## 2023-12-12 ENCOUNTER — Other Ambulatory Visit: Payer: Self-pay

## 2023-12-12 DIAGNOSIS — I10 Essential (primary) hypertension: Secondary | ICD-10-CM

## 2023-12-12 MED ORDER — AMLODIPINE BESYLATE 5 MG PO TABS: 5.0000 mg | ORAL_TABLET | Freq: Every day | ORAL | 0 refills | Status: DC

## 2024-01-08 ENCOUNTER — Other Ambulatory Visit: Payer: Self-pay | Admitting: Internal Medicine

## 2024-01-09 ENCOUNTER — Other Ambulatory Visit: Payer: Self-pay

## 2024-01-09 DIAGNOSIS — I10 Essential (primary) hypertension: Secondary | ICD-10-CM

## 2024-01-25 ENCOUNTER — Other Ambulatory Visit: Payer: Self-pay | Admitting: Internal Medicine

## 2024-01-25 DIAGNOSIS — I10 Essential (primary) hypertension: Secondary | ICD-10-CM

## 2024-02-13 ENCOUNTER — Ambulatory Visit: Admitting: Internal Medicine

## 2024-02-19 ENCOUNTER — Ambulatory Visit: Admitting: Internal Medicine

## 2024-02-26 ENCOUNTER — Other Ambulatory Visit: Payer: Self-pay | Admitting: Internal Medicine

## 2024-02-26 DIAGNOSIS — I10 Essential (primary) hypertension: Secondary | ICD-10-CM

## 2024-02-27 ENCOUNTER — Other Ambulatory Visit: Payer: Self-pay

## 2024-02-27 DIAGNOSIS — I10 Essential (primary) hypertension: Secondary | ICD-10-CM

## 2024-02-28 ENCOUNTER — Other Ambulatory Visit: Payer: Self-pay | Admitting: Internal Medicine

## 2024-03-04 ENCOUNTER — Ambulatory Visit: Admitting: Internal Medicine

## 2024-03-25 ENCOUNTER — Ambulatory Visit: Admitting: Internal Medicine
